# Patient Record
Sex: Female | Born: 1935 | Race: White | Hispanic: No | Marital: Married | Smoking: Never smoker
Health system: Southern US, Community
[De-identification: ages and names within clinical notes are randomized; demographics above are authoritative.]

## PROBLEM LIST (undated history)

## (undated) DIAGNOSIS — C801 Malignant (primary) neoplasm, unspecified: Secondary | ICD-10-CM

## (undated) DIAGNOSIS — J189 Pneumonia, unspecified organism: Secondary | ICD-10-CM

## (undated) DIAGNOSIS — I10 Essential (primary) hypertension: Secondary | ICD-10-CM

## (undated) HISTORY — DX: Essential (primary) hypertension: I10

## (undated) HISTORY — DX: Pneumonia, unspecified organism: J18.9

## (undated) HISTORY — DX: Malignant (primary) neoplasm, unspecified: C80.1

---

## 1975-01-09 HISTORY — PX: ABDOMINAL HYSTERECTOMY: SHX81

## 1976-01-09 HISTORY — PX: HAND SURGERY: SHX662

## 2008-07-30 ENCOUNTER — Encounter: Admission: RE | Admit: 2008-07-30 | Discharge: 2008-07-30 | Payer: Self-pay | Admitting: Geriatric Medicine

## 2008-08-11 ENCOUNTER — Encounter: Admission: RE | Admit: 2008-08-11 | Discharge: 2008-08-11 | Payer: Self-pay | Admitting: Geriatric Medicine

## 2008-12-24 ENCOUNTER — Ambulatory Visit (HOSPITAL_COMMUNITY): Admission: RE | Admit: 2008-12-24 | Discharge: 2008-12-24 | Payer: Self-pay | Admitting: Gastroenterology

## 2009-01-08 HISTORY — PX: OTHER SURGICAL HISTORY: SHX169

## 2009-02-09 ENCOUNTER — Encounter: Admission: RE | Admit: 2009-02-09 | Discharge: 2009-02-09 | Payer: Self-pay | Admitting: Geriatric Medicine

## 2009-08-01 ENCOUNTER — Encounter: Admission: RE | Admit: 2009-08-01 | Discharge: 2009-08-01 | Payer: Self-pay | Admitting: Geriatric Medicine

## 2009-09-19 ENCOUNTER — Encounter: Admission: RE | Admit: 2009-09-19 | Discharge: 2009-09-19 | Payer: Self-pay | Admitting: Surgery

## 2009-09-19 ENCOUNTER — Ambulatory Visit (HOSPITAL_COMMUNITY): Admission: RE | Admit: 2009-09-19 | Discharge: 2009-09-19 | Payer: Self-pay | Admitting: Surgery

## 2009-09-21 ENCOUNTER — Ambulatory Visit: Payer: Self-pay | Admitting: Oncology

## 2009-09-28 LAB — CANCER ANTIGEN 27.29: CA 27.29: 21 U/mL (ref 0–39)

## 2010-03-23 LAB — COMPREHENSIVE METABOLIC PANEL
AST: 25 U/L (ref 0–37)
Alkaline Phosphatase: 63 U/L (ref 39–117)
BUN: 14 mg/dL (ref 6–23)
Calcium: 9.2 mg/dL (ref 8.4–10.5)
Creatinine, Ser: 0.76 mg/dL (ref 0.4–1.2)
Potassium: 5.2 mEq/L — ABNORMAL HIGH (ref 3.5–5.1)
Sodium: 140 mEq/L (ref 135–145)

## 2010-03-23 LAB — DIFFERENTIAL
Basophils Relative: 0 % (ref 0–1)
Lymphocytes Relative: 32 % (ref 12–46)
Monocytes Relative: 13 % — ABNORMAL HIGH (ref 3–12)
Neutro Abs: 3 10*3/uL (ref 1.7–7.7)

## 2010-03-23 LAB — CBC
HCT: 41.7 % (ref 36.0–46.0)
MCHC: 33.1 g/dL (ref 30.0–36.0)
Platelets: 294 10*3/uL (ref 150–400)
RDW: 12.9 % (ref 11.5–15.5)

## 2010-03-23 LAB — SURGICAL PCR SCREEN: MRSA, PCR: NEGATIVE

## 2010-05-01 ENCOUNTER — Other Ambulatory Visit: Payer: Self-pay | Admitting: Oncology

## 2010-05-01 ENCOUNTER — Encounter (HOSPITAL_BASED_OUTPATIENT_CLINIC_OR_DEPARTMENT_OTHER): Payer: Medicare PPO | Admitting: Oncology

## 2010-05-01 DIAGNOSIS — C50919 Malignant neoplasm of unspecified site of unspecified female breast: Secondary | ICD-10-CM

## 2010-05-01 DIAGNOSIS — I10 Essential (primary) hypertension: Secondary | ICD-10-CM

## 2010-05-01 DIAGNOSIS — M1990 Unspecified osteoarthritis, unspecified site: Secondary | ICD-10-CM

## 2010-05-01 DIAGNOSIS — C50519 Malignant neoplasm of lower-outer quadrant of unspecified female breast: Secondary | ICD-10-CM

## 2010-05-01 DIAGNOSIS — Z9889 Other specified postprocedural states: Secondary | ICD-10-CM

## 2010-05-01 LAB — CBC WITH DIFFERENTIAL/PLATELET
BASO%: 0.5 % (ref 0.0–2.0)
HCT: 39.5 % (ref 34.8–46.6)
LYMPH%: 30.7 % (ref 14.0–49.7)
MCHC: 33.8 g/dL (ref 31.5–36.0)
MCV: 101 fL (ref 79.5–101.0)
NEUT#: 3.9 10*3/uL (ref 1.5–6.5)
NEUT%: 60.5 % (ref 38.4–76.8)
RBC: 3.91 10*6/uL (ref 3.70–5.45)
WBC: 6.4 10*3/uL (ref 3.9–10.3)
lymph#: 2 10*3/uL (ref 0.9–3.3)

## 2010-05-09 ENCOUNTER — Encounter (INDEPENDENT_AMBULATORY_CARE_PROVIDER_SITE_OTHER): Payer: Self-pay | Admitting: Surgery

## 2010-08-03 ENCOUNTER — Ambulatory Visit
Admission: RE | Admit: 2010-08-03 | Discharge: 2010-08-03 | Disposition: A | Discharge status: Home / Self Care | Payer: Medicare PPO | Source: Ambulatory Visit | Attending: Oncology | Admitting: Oncology

## 2010-08-03 DIAGNOSIS — Z9889 Other specified postprocedural states: Secondary | ICD-10-CM

## 2010-10-16 ENCOUNTER — Other Ambulatory Visit: Payer: Self-pay | Admitting: Oncology

## 2010-10-16 ENCOUNTER — Encounter (HOSPITAL_BASED_OUTPATIENT_CLINIC_OR_DEPARTMENT_OTHER): Payer: Medicare PPO | Admitting: Oncology

## 2010-10-16 DIAGNOSIS — M1990 Unspecified osteoarthritis, unspecified site: Secondary | ICD-10-CM

## 2010-10-16 DIAGNOSIS — C50519 Malignant neoplasm of lower-outer quadrant of unspecified female breast: Secondary | ICD-10-CM

## 2010-10-16 DIAGNOSIS — I10 Essential (primary) hypertension: Secondary | ICD-10-CM

## 2010-10-16 DIAGNOSIS — Z170 Estrogen receptor positive status [ER+]: Secondary | ICD-10-CM

## 2010-10-16 DIAGNOSIS — C50919 Malignant neoplasm of unspecified site of unspecified female breast: Secondary | ICD-10-CM

## 2010-10-16 LAB — CBC WITH DIFFERENTIAL/PLATELET
BASO%: 0.4 % (ref 0.0–2.0)
Basophils Absolute: 0 10*3/uL (ref 0.0–0.1)
EOS%: 0.7 % (ref 0.0–7.0)
Eosinophils Absolute: 0.1 10*3/uL (ref 0.0–0.5)
HGB: 12.7 g/dL (ref 11.6–15.9)
LYMPH%: 25.3 % (ref 14.0–49.7)
MCH: 32.9 pg (ref 25.1–34.0)
MCHC: 33.4 g/dL (ref 31.5–36.0)
NEUT#: 5.3 10*3/uL (ref 1.5–6.5)
NEUT%: 63.3 % (ref 38.4–76.8)
WBC: 8.3 10*3/uL (ref 3.9–10.3)
lymph#: 2.1 10*3/uL (ref 0.9–3.3)

## 2010-12-11 ENCOUNTER — Telehealth: Payer: Self-pay | Admitting: *Deleted

## 2010-12-11 NOTE — Telephone Encounter (Signed)
left voice message to inform the patient of the new date and time on 10-2011

## 2011-05-08 ENCOUNTER — Encounter (INDEPENDENT_AMBULATORY_CARE_PROVIDER_SITE_OTHER): Payer: Self-pay | Admitting: Surgery

## 2011-05-08 ENCOUNTER — Ambulatory Visit (INDEPENDENT_AMBULATORY_CARE_PROVIDER_SITE_OTHER): Payer: Medicare PPO | Admitting: Surgery

## 2011-05-08 VITALS — BP 132/72 | HR 62 | Temp 98.4°F | Resp 16 | Ht 64.0 in | Wt 166.4 lb

## 2011-05-08 DIAGNOSIS — Z853 Personal history of malignant neoplasm of breast: Secondary | ICD-10-CM

## 2011-05-08 NOTE — Patient Instructions (Signed)
Follow up 1 year April 2014.

## 2011-05-08 NOTE — Progress Notes (Signed)
NAME: [name]       DOB: 1922-06-21           DATE: 05/08/2011       MRN: [id number]   [name] is a 78 y.o.Marland Kitchenfemale who presents for routine followup of her Right breast cancer stage1 diagnosed in 2011 and treated with breast conservation.. She has no problems or concerns on either side.  PFSH: She has had no significant changes since the last visit here.  ROS: There have been no significant changes since the last visit here  EXAM: General: The patient is alert, oriented, generally healty appearing, NAD. Mood and affect are normal.  Breasts:  normal .  No masses noted bilaterally.  Well healed surgical scar on right  Lymphatics: She has no axillary or supraclavicular adenopathy on either side.  Extremities: Full ROM of the surgical side with no lymphedema noted.  Data Reviewed:   Impression: Doing well, with no evidence of recurrent cancer or new cancer  Plan: Will continue to follow up on an annual basis here.

## 2011-06-11 ENCOUNTER — Other Ambulatory Visit: Payer: Self-pay | Admitting: Geriatric Medicine

## 2011-06-11 DIAGNOSIS — R200 Anesthesia of skin: Secondary | ICD-10-CM

## 2011-06-12 ENCOUNTER — Ambulatory Visit
Admission: RE | Admit: 2011-06-12 | Discharge: 2011-06-12 | Disposition: A | Discharge status: Home / Self Care | Payer: Medicare PPO | Source: Ambulatory Visit | Attending: Geriatric Medicine | Admitting: Geriatric Medicine

## 2011-06-12 DIAGNOSIS — R200 Anesthesia of skin: Secondary | ICD-10-CM

## 2011-06-15 ENCOUNTER — Emergency Department (HOSPITAL_COMMUNITY): Payer: Medicare PPO

## 2011-06-15 ENCOUNTER — Emergency Department (HOSPITAL_COMMUNITY)
Admission: EM | Admit: 2011-06-15 | Discharge: 2011-06-15 | Disposition: A | Discharge status: Home / Self Care | Payer: Medicare PPO | Attending: Emergency Medicine | Admitting: Emergency Medicine

## 2011-06-15 ENCOUNTER — Encounter (HOSPITAL_COMMUNITY): Payer: Self-pay | Admitting: *Deleted

## 2011-06-15 DIAGNOSIS — R9431 Abnormal electrocardiogram [ECG] [EKG]: Secondary | ICD-10-CM | POA: Insufficient documentation

## 2011-06-15 DIAGNOSIS — G319 Degenerative disease of nervous system, unspecified: Secondary | ICD-10-CM | POA: Insufficient documentation

## 2011-06-15 DIAGNOSIS — R209 Unspecified disturbances of skin sensation: Secondary | ICD-10-CM | POA: Insufficient documentation

## 2011-06-15 DIAGNOSIS — I10 Essential (primary) hypertension: Secondary | ICD-10-CM | POA: Insufficient documentation

## 2011-06-15 DIAGNOSIS — M47812 Spondylosis without myelopathy or radiculopathy, cervical region: Secondary | ICD-10-CM | POA: Insufficient documentation

## 2011-06-15 DIAGNOSIS — R202 Paresthesia of skin: Secondary | ICD-10-CM

## 2011-06-15 DIAGNOSIS — Z853 Personal history of malignant neoplasm of breast: Secondary | ICD-10-CM | POA: Insufficient documentation

## 2011-06-15 LAB — PREGNANCY, URINE: Preg Test, Ur: NEGATIVE

## 2011-06-15 LAB — D-DIMER, QUANTITATIVE: D-Dimer, Quant: 0.41 ug/mL-FEU (ref 0.00–0.48)

## 2011-06-15 LAB — URINALYSIS, ROUTINE W REFLEX MICROSCOPIC
Nitrite: NEGATIVE
Protein, ur: NEGATIVE mg/dL
Specific Gravity, Urine: 1.003 — ABNORMAL LOW (ref 1.005–1.030)
Urobilinogen, UA: 0.2 mg/dL (ref 0.0–1.0)

## 2011-06-15 LAB — COMPREHENSIVE METABOLIC PANEL
BUN: 18 mg/dL (ref 6–23)
CO2: 23 mEq/L (ref 19–32)
Calcium: 9.3 mg/dL (ref 8.4–10.5)
Chloride: 103 mEq/L (ref 96–112)
Creatinine, Ser: 0.95 mg/dL (ref 0.50–1.10)
GFR calc Af Amer: 61 mL/min — ABNORMAL LOW (ref >90)
GFR calc non Af Amer: 52 mL/min — ABNORMAL LOW (ref >90)
Glucose, Bld: 105 mg/dL — ABNORMAL HIGH (ref 70–99)
Total Bilirubin: 0.3 mg/dL (ref 0.3–1.2)

## 2011-06-15 LAB — POCT I-STAT TROPONIN I: Troponin i, poc: 0 ng/mL (ref 0.00–0.08)

## 2011-06-15 LAB — CBC
HCT: 41.5 % (ref 36.0–46.0)
Hemoglobin: 13.9 g/dL (ref 12.0–15.0)
MCHC: 33.5 g/dL (ref 30.0–36.0)
MCV: 99.3 fL (ref 78.0–100.0)
RDW: 14.1 % (ref 11.5–15.5)
WBC: 7 10*3/uL (ref 4.0–10.5)

## 2011-06-15 LAB — DIFFERENTIAL
Basophils Absolute: 0 10*3/uL (ref 0.0–0.1)
Eosinophils Relative: 1 % (ref 0–5)
Lymphocytes Relative: 33 % (ref 12–46)
Monocytes Absolute: 0.7 10*3/uL (ref 0.1–1.0)
Monocytes Relative: 10 % (ref 3–12)
Neutro Abs: 3.9 10*3/uL (ref 1.7–7.7)

## 2011-06-15 LAB — URINE MICROSCOPIC-ADD ON

## 2011-06-15 LAB — APTT: aPTT: 28 seconds (ref 24–37)

## 2011-06-15 NOTE — ED Notes (Signed)
PT reported feeling tingling in both arms since SAt. Pt was seen at the wellness center at [facility] living center. Pt had CP on Sat. But did not come to Hospital. Today Pt denies CP.

## 2011-06-15 NOTE — Discharge Instructions (Signed)
[name], you had physical exam, laboratory tests, an MRI of the brain and cervical spine to check on you for tingling feelings in the hands and feet.  Fortunately, your tests and MRI's were normal.  The feelings that you have are called paresthesias, and are not a stroke.  It is safe to return home and to continue to take your regular medicines.

## 2011-06-15 NOTE — ED Notes (Signed)
Discharged with instructions via teach back. WC to car pt alertx3 respirations easy non labored.

## 2011-06-15 NOTE — ED Provider Notes (Signed)
History     CSN: [id number]  Arrival date & time 06/15/11  53   First MD Initiated Contact with Patient 06/15/11 1544      No chief complaint on file.   (Consider location/radiation/quality/duration/timing/severity/associated sxs/prior treatment) HPI Comments: Pt is an 78 year old woman living at the [facility].  She had had an episode of numbness and tingling in her neck, both hands, and right foot. She was observed in the infirmary at the [facility] and had resolution of her symptoms.  An MRI of the brain was negative on Tuesday, 06/12/2011.  Now she has had recurrence of the tingling in the hands and fingers and a bandlike feeling in the right ankle.  There is no difficulty with speech.  She has no difficulty walking  With her recurrence of symptoms she was sent to [facility] ED for further evaluaton.  I called Dr. Felipa Eth, her internist, who said that her symptoms had entirely resolved when she was released on Wednesday, 2 days ago.   Patient is a 78 y.o. female presenting with neurologic complaint.  Neurologic Problem The primary symptoms include paresthesias. Primary symptoms do not include fever. The symptoms began 3 to 5 days ago. Episode duration: Had last Saturday, 5 days ago, symptoms resolved, and have recurred today. The symptoms are unchanged. The neurological symptoms are focal. Context: No precipitating event.  The paresthesias are described as tingling. Affected locations include the: right hand, left hand and right distal leg.  Medical issues also include hypertension. Workup history includes MRI.    Past Medical History  Diagnosis Date  . Pneumonia   . Cancer     breast - right    Past Surgical History  Procedure Date  . Abdominal hysterectomy 1977  . Hand surgery 1978  . Toe nail removal 2011    Family History  Problem Relation Age of Onset  . Stroke Mother   . Cancer Father     History  Substance Use Topics  . Smoking status: Never Smoker   .  Smokeless tobacco: Never Used  . Alcohol Use: Yes     1 GLASS A MONTH    OB History    Grav Para Term Preterm Abortions TAB SAB Ect Mult Living                  Review of Systems  Constitutional: Negative.  Negative for fever and chills.  HENT: Negative.   Eyes: Negative.   Respiratory: Negative.   Cardiovascular: Negative.   Gastrointestinal: Negative.   Genitourinary: Negative.   Musculoskeletal: Negative.   Neurological: Positive for paresthesias. Numbness: Numbness and tingling in the hands, and the right ankle.  Psychiatric/Behavioral: Negative.     Allergies  Review of patient's allergies indicates no known allergies.  Home Medications   Current Outpatient Rx  Name Route Sig Dispense Refill  . ASPIRIN 81 MG PO TABS Oral Take 81 mg by mouth daily.      Marland Kitchen CRANBERRY PO Oral Take 1 capsule by mouth daily.     . FUROSEMIDE 40 MG PO TABS Oral Take 40 mg by mouth Daily.     Marland Kitchen GLUCOSAMINE SULFATE 1000 MG PO CAPS Oral Take 1 capsule by mouth daily.     Marland Kitchen NAPHAZOLINE HCL 0.012 % OP SOLN Both Eyes Place 1 drop into both eyes daily as needed. For dry eyes      BP 128/49  Pulse 85  Temp(Src) 97.9 F (36.6 C) (Oral)  Resp 18  SpO2 95%  Physical Exam  Nursing note and vitals reviewed. Constitutional: She is oriented to person, place, and time. She appears well-developed and well-nourished. No distress.  HENT:  Head: Normocephalic and atraumatic.  Right Ear: External ear normal.  Left Ear: External ear normal.  Mouth/Throat: Oropharynx is clear and moist.  Eyes: Conjunctivae and EOM are normal. Pupils are equal, round, and reactive to light. No scleral icterus.  Neck: Normal range of motion. Neck supple.       No carotid bruits.  Cardiovascular: Normal rate, regular rhythm and normal heart sounds.   Pulmonary/Chest: Effort normal and breath sounds normal.  Abdominal: Soft. Bowel sounds are normal.  Musculoskeletal: Normal range of motion. She exhibits no edema and no  tenderness.  Neurological: She is alert and oriented to person, place, and time.       No sensory or motor deficit. She has a qualitative tingly feeling in her right hand, less so in the left hand, and a circumferential feeling in the right ankle.  Skin: Skin is warm and dry.  Psychiatric: She has a normal mood and affect. Her behavior is normal.    ED Course  Procedures (including critical care time)  3:44 PMMr Brain Wo Contrast  06/12/2011  *RADIOLOGY REPORT*  Clinical Data: Right arm and bilateral foot numbness.  Question stroke?  MRI HEAD WITHOUT CONTRAST  Technique:  Multiplanar, multiecho pulse sequences of the brain and surrounding structures were obtained according to standard protocol without intravenous contrast.  Comparison: None.  Findings: No acute infarct.  No intracranial hemorrhage.  Mild global atrophy without hydrocephalus.  Moderate small vessel disease type changes.  Major intracranial vascular structures are patent with small right vertebral artery.  Partially empty sella incidentally noted.  IMPRESSION: No acute infarct.  Please see above.  Original Report Authenticated By: Doug Sou, M.D.   3:45 PM  Date: 06/15/2011  Rate: 95  Rhythm: normal sinus rhythm and premature ventricular contractions (PVC)--ventricular bigeminy  QRS Axis: normal  Intervals: normal PQRS:  Biatrial abnormalities  ST/T Wave abnormalities: normal and nonspecific T wave changes  Conduction Disutrbances:none  Narrative Interpretation: Abnormal EKG  Old EKG Reviewed: none available  6:41 PM Results for orders placed during the hospital encounter of 06/15/11  CBC      Component Value Range   WBC 7.0  4.0 - 10.5 (K/uL)   RBC 4.18  3.87 - 5.11 (MIL/uL)   Hemoglobin 13.9  12.0 - 15.0 (g/dL)   HCT 41.5  36.0 - 46.0 (%)   MCV 99.3  78.0 - 100.0 (fL)   MCH 33.3  26.0 - 34.0 (pg)   MCHC 33.5  30.0 - 36.0 (g/dL)   RDW 14.1  11.5 - 15.5 (%)   Platelets 261  150 - 400 (K/uL)  DIFFERENTIAL       Component Value Range   Neutrophils Relative 56  43 - 77 (%)   Neutro Abs 3.9  1.7 - 7.7 (K/uL)   Lymphocytes Relative 33  12 - 46 (%)   Lymphs Abs 2.3  0.7 - 4.0 (K/uL)   Monocytes Relative 10  3 - 12 (%)   Monocytes Absolute 0.7  0.1 - 1.0 (K/uL)   Eosinophils Relative 1  0 - 5 (%)   Eosinophils Absolute 0.0  0.0 - 0.7 (K/uL)   Basophils Relative 0  0 - 1 (%)   Basophils Absolute 0.0  0.0 - 0.1 (K/uL)  COMPREHENSIVE METABOLIC PANEL      Component Value Range  Sodium 140  135 - 145 (mEq/L)   Potassium 4.2  3.5 - 5.1 (mEq/L)   Chloride 103  96 - 112 (mEq/L)   CO2 23  19 - 32 (mEq/L)   Glucose, Bld 105 (*) 70 - 99 (mg/dL)   BUN 18  6 - 23 (mg/dL)   Creatinine, Ser 0.95  0.50 - 1.10 (mg/dL)   Calcium 9.3  8.4 - 10.5 (mg/dL)   Total Protein 7.0  6.0 - 8.3 (g/dL)   Albumin 3.8  3.5 - 5.2 (g/dL)   AST 18  0 - 37 (U/L)   ALT 14  0 - 35 (U/L)   Alkaline Phosphatase 69  39 - 117 (U/L)   Total Bilirubin 0.3  0.3 - 1.2 (mg/dL)   GFR calc non Af Amer 52 (*) >90 (mL/min)   GFR calc Af Amer 61 (*) >90 (mL/min)  URINALYSIS, ROUTINE W REFLEX MICROSCOPIC      Component Value Range   Color, Urine YELLOW  YELLOW    APPearance HAZY (*) CLEAR    Specific Gravity, Urine 1.003 (*) 1.005 - 1.030    pH 7.5  5.0 - 8.0    Glucose, UA NEGATIVE  NEGATIVE (mg/dL)   Hgb urine dipstick TRACE (*) NEGATIVE    Bilirubin Urine NEGATIVE  NEGATIVE    Ketones, ur NEGATIVE  NEGATIVE (mg/dL)   Protein, ur NEGATIVE  NEGATIVE (mg/dL)   Urobilinogen, UA 0.2  0.0 - 1.0 (mg/dL)   Nitrite NEGATIVE  NEGATIVE    Leukocytes, UA MODERATE (*) NEGATIVE   PREGNANCY, URINE      Component Value Range   Preg Test, Ur NEGATIVE  NEGATIVE   D-DIMER, QUANTITATIVE      Component Value Range   D-Dimer, Quant 0.41  0.00 - 0.48 (ug/mL-FEU)  PROTIME-INR      Component Value Range   Prothrombin Time 15.2  11.6 - 15.2 (seconds)   INR 1.18  0.00 - 1.49   APTT      Component Value Range   aPTT 28  24 - 37 (seconds)  URINE  MICROSCOPIC-ADD ON      Component Value Range   Squamous Epithelial / LPF RARE  RARE    WBC, UA 11-20  <3 (WBC/hpf)   RBC / HPF 0-2  <3 (RBC/hpf)   Bacteria, UA FEW (*) RARE   POCT I-STAT TROPONIN I      Component Value Range   Troponin i, poc 0.00  0.00 - 0.08 (ng/mL)   Comment 3            Mr Brain Wo Contrast  06/12/2011  *RADIOLOGY REPORT*  Clinical Data: Right arm and bilateral foot numbness.  Question stroke?  MRI HEAD WITHOUT CONTRAST  Technique:  Multiplanar, multiecho pulse sequences of the brain and surrounding structures were obtained according to standard protocol without intravenous contrast.  Comparison: None.  Findings: No acute infarct.  No intracranial hemorrhage.  Mild global atrophy without hydrocephalus.  Moderate small vessel disease type changes.  Major intracranial vascular structures are patent with small right vertebral artery.  Partially empty sella incidentally noted.  IMPRESSION: No acute infarct.  Please see above.  Original Report Authenticated By: Doug Sou, M.D.    Lab workup negative so far.  Waiting for results of today's MRIs.  7:49 PM MRI of head and C-spine negative.  Results reported to pt.  I advised her that the feelings that she had were called paresthesias, and were not a stroke.  It is  safe to return to her facilty and to continue her regular medicines.   1. Paresthesia        Mylinda Latina III, MD 06/15/11 [phone]

## 2011-06-15 NOTE — ED Notes (Signed)
Phlebotomy at bedside.

## 2011-06-22 ENCOUNTER — Other Ambulatory Visit: Payer: Self-pay | Admitting: Geriatric Medicine

## 2011-06-22 DIAGNOSIS — Z853 Personal history of malignant neoplasm of breast: Secondary | ICD-10-CM

## 2011-06-22 DIAGNOSIS — Z9889 Other specified postprocedural states: Secondary | ICD-10-CM

## 2011-08-08 ENCOUNTER — Ambulatory Visit
Admission: RE | Admit: 2011-08-08 | Discharge: 2011-08-08 | Disposition: A | Discharge status: Home / Self Care | Payer: Medicare PPO | Source: Ambulatory Visit | Attending: Geriatric Medicine | Admitting: Geriatric Medicine

## 2011-08-08 DIAGNOSIS — Z9889 Other specified postprocedural states: Secondary | ICD-10-CM

## 2011-08-08 DIAGNOSIS — Z853 Personal history of malignant neoplasm of breast: Secondary | ICD-10-CM

## 2011-10-16 ENCOUNTER — Other Ambulatory Visit: Payer: Medicare PPO | Admitting: Lab

## 2011-10-16 ENCOUNTER — Ambulatory Visit: Payer: Medicare PPO | Admitting: Oncology

## 2011-11-27 ENCOUNTER — Other Ambulatory Visit: Payer: Self-pay | Admitting: *Deleted

## 2011-11-27 DIAGNOSIS — Z853 Personal history of malignant neoplasm of breast: Secondary | ICD-10-CM | POA: Insufficient documentation

## 2011-11-28 ENCOUNTER — Telehealth: Payer: Self-pay | Admitting: Oncology

## 2011-11-28 ENCOUNTER — Ambulatory Visit (HOSPITAL_BASED_OUTPATIENT_CLINIC_OR_DEPARTMENT_OTHER): Payer: Medicare PPO | Admitting: Oncology

## 2011-11-28 ENCOUNTER — Other Ambulatory Visit (HOSPITAL_BASED_OUTPATIENT_CLINIC_OR_DEPARTMENT_OTHER): Payer: Medicare PPO | Admitting: Lab

## 2011-11-28 VITALS — BP 175/85 | HR 84 | Temp 97.6°F | Resp 18 | Ht 64.0 in | Wt 159.4 lb

## 2011-11-28 DIAGNOSIS — Z170 Estrogen receptor positive status [ER+]: Secondary | ICD-10-CM

## 2011-11-28 DIAGNOSIS — Z853 Personal history of malignant neoplasm of breast: Secondary | ICD-10-CM

## 2011-11-28 DIAGNOSIS — C50519 Malignant neoplasm of lower-outer quadrant of unspecified female breast: Secondary | ICD-10-CM

## 2011-11-28 DIAGNOSIS — C50919 Malignant neoplasm of unspecified site of unspecified female breast: Secondary | ICD-10-CM

## 2011-11-28 LAB — CBC WITH DIFFERENTIAL/PLATELET
BASO%: 0.4 % (ref 0.0–2.0)
LYMPH%: 29.1 % (ref 14.0–49.7)
MCHC: 33.5 g/dL (ref 31.5–36.0)
MONO#: 0.7 10*3/uL (ref 0.1–0.9)
MONO%: 10.1 % (ref 0.0–14.0)
NEUT#: 4.3 10*3/uL (ref 1.5–6.5)
Platelets: 266 10*3/uL (ref 145–400)
RBC: 4.19 10*6/uL (ref 3.70–5.45)
RDW: 13.7 % (ref 11.2–14.5)
WBC: 7.2 10*3/uL (ref 3.9–10.3)

## 2011-11-28 LAB — COMPREHENSIVE METABOLIC PANEL (CC13)
ALT: 16 U/L (ref 0–55)
Albumin: 3.8 g/dL (ref 3.5–5.0)
Alkaline Phosphatase: 68 U/L (ref 40–150)
CO2: 22 mEq/L (ref 22–29)
Potassium: 5 mEq/L (ref 3.5–5.1)
Sodium: 139 mEq/L (ref 136–145)
Total Bilirubin: 0.5 mg/dL (ref 0.20–1.20)
Total Protein: 7 g/dL (ref 6.4–8.3)

## 2011-11-28 NOTE — Progress Notes (Signed)
ID: [name]   DOB: 12/22/1922  MR#: [id number]  [id number]  PCP: Mathews Argyle, MD GYN:  SU: Thomas Cornett OTHER MD: Carmon Sails   HISTORY OF PRESENT ILLNESS: The patient had screening mammography July of 2010, showing a possible mass in the right breast.  She was brought back for additional views August of 2010 and there was persistence of a 5 mm low-density nodule with no associated malignant type calcifications.  This was not palpable and not apparent by sonogram.    Accordingly, the patient was brought back for six-month follow-up in February 2011.  There was no change in the 5 mm nodule over the central right breast, and the rest of the exam is unchanged.  An additional six-month follow-up was recommended, and this was performed August 01, 2009.  This time the breast tissue is described as almost entirely fatty.  There was a small spiculated nodule in the right lower outer quadrant, which again was not palpable by physical examination.  Ultrasound did show an ill-defined hypoechoic nodule measuring approximately 5 mm, with no evidence of axillary involvement by ultrasonography.  This was felt to be suspicious enough to warrant biopsy, and accordingly, Dr. Sadie Haber proceeded to biopsy of the mass on the same day with the pathology ([id number]) showing invasive breast cancer apparently low-grade, estrogen 100% and progesterone 77% positive, with a low proliferation marker at 15% and no evidence of HER2 amplification.   With this information, the patient was referred to Dr. Brantley Stage, and after appropriate discussion, the patient underwent needle localization lumpectomy September 19, 2009, with the final pathology [id number]) showing invasive lobular carcinoma measuring a maximum of 0.48 cm.  The margins were negative.  The closest being nearly 1 cm.  There was no evidence of lymphovascular invasion.  The tumor was described as grade 1, and no axillary lymph node was sampled.  Her subsequent history is as detailed below.  INTERVAL HISTORY: The patient returns today for routine followup of her breast cancer. She is "keeping busy", exercising for mornings a week and 4 afternoons a week at [facility] where she [facility].   REVIEW OF SYSTEMS: She is having knee problems and is considering knee surgery next year. Otherwise she has not noted any change in either breast, and has no symptoms suggestive of disease recurrence or progression. A detailed review of systems today was noncontributory.  PAST MEDICAL HISTORY: Past Medical History  Diagnosis Date  . Pneumonia   . Cancer     breast - right  The past medical history is significant for hypertension, osteoarthritis, history of ingrown eyelashes, history of seasonal allergies, history of moderate bladder incontinence with occasional UTIs.    PAST SURGICAL HISTORY: Past Surgical History  Procedure Date  . Abdominal hysterectomy 1977  . Hand surgery 1978  . Toe nail removal 2011  History of hammertoe surgery x two, status post TAH-BSO in 1974, history of blepharoplasty, history of appendectomy, history of tonsillectomy and adenoidectomy.     FAMILY HISTORY Family History  Problem Relation Age of Onset  . Stroke Mother   . Cancer Father   The patient's father died with prostate cancer at the age of 23.  The patient's mother died at the age of 31 following a stroke.  The patient had one sister.  The only breast cancer in the family was diagnosed in one of the patient's mother's three sisters in that person's mid-60s.    GYNECOLOGIC HISTORY: She is GX P2.  First pregnancy to term age  42. She took hormones less than a year after her hysterectomy in 1974.    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  Her husband of soon to be 23 years, Barnabas Lister, used to work for [employer] as a Quarry manager.  They have a side that died within the last year from a staph infection.  A second son, 70 years old lives in [place], and works for  the state.  The patient has six grandchildren, seven great-grandchildren and one great great-grandchild.  She is an Engineer, maintenance (IT).     ADVANCED DIRECTIVES:  HEALTH MAINTENANCE: History  Substance Use Topics  . Smoking status: Never Smoker   . Smokeless tobacco: Never Used  . Alcohol Use: Yes     Comment: 1 GLASS A MONTH     Colonoscopy:  PAP:  Bone density:  Lipid panel:  Allergies  Allergen Reactions  . Macrobid (Nitrofurantoin Macrocrystal)     Caused shortness of breath.    Current Outpatient Prescriptions  Medication Sig Dispense Refill  . aspirin 81 MG tablet Take 81 mg by mouth daily.        Marland Kitchen CRANBERRY PO Take 1 capsule by mouth daily.       . furosemide (LASIX) 40 MG tablet Take 40 mg by mouth Daily.       . Glucosamine Sulfate 1000 MG CAPS Take 1 capsule by mouth daily.       . meloxicam (MOBIC) 7.5 MG tablet Take 7.5 mg by mouth daily.      . naphazoline (CLEAR EYES) 0.012 % ophthalmic solution Place 1 drop into both eyes daily as needed. For dry eyes        OBJECTIVE: Elderly white woman in no acute distress Filed Vitals:   11/28/11 1100  BP: 175/85  Pulse: 84  Temp: 97.6 F (36.4 C)  Resp: 18     Body mass index is 27.36 kg/(m^2).    ECOG FS: 1  Sclerae unicteric Oropharynx clear No cervical or supraclavicular adenopathy Lungs no rales or rhonchi Heart regular rate and rhythm Abd benign MSK no focal spinal tenderness, no peripheral edema Neuro: nonfocal Breasts: The right breast is status post lumpectomy; there is no evidence of local recurrence; the right axilla is benign. The left breast is unremarkable.   LAB RESULTS: Lab Results  Component Value Date   WBC 7.2 11/28/2011   NEUTROABS 4.3 11/28/2011   HGB 14.1 11/28/2011   HCT 42.1 11/28/2011   MCV 100.5 11/28/2011   PLT 266 11/28/2011      Chemistry      Component Value Date/Time   NA 140 06/15/2011 1734   K 4.2 06/15/2011 1734   CL 103 06/15/2011 1734   CO2 23 06/15/2011 1734   BUN 18  06/15/2011 1734   CREATININE 0.95 06/15/2011 1734      Component Value Date/Time   CALCIUM 9.3 06/15/2011 1734   ALKPHOS 69 06/15/2011 1734   AST 18 06/15/2011 1734   ALT 14 06/15/2011 1734   BILITOT 0.3 06/15/2011 1734       Lab Results  Component Value Date   LABCA2 21 09/28/2009    No components found with this basename: [id number]    No results found for this basename: INR:1;PROTIME:1 in the last 168 hours  Urinalysis    Component Value Date/Time   COLORURINE YELLOW 06/15/2011 1628   APPEARANCEUR HAZY* 06/15/2011 1628   LABSPEC 1.003* 06/15/2011 1628   PHURINE 7.5 06/15/2011 1628   GLUCOSEU NEGATIVE 06/15/2011 1628   HGBUR  TRACE* 06/15/2011 1628   BILIRUBINUR NEGATIVE 06/15/2011 1628   KETONESUR NEGATIVE 06/15/2011 1628   PROTEINUR NEGATIVE 06/15/2011 1628   UROBILINOGEN 0.2 06/15/2011 1628   NITRITE NEGATIVE 06/15/2011 1628   LEUKOCYTESUR MODERATE* 06/15/2011 1628    STUDIES: No results found. Mammography July 2013 was benign  ASSESSMENT: 78 y.o.  woman status post right lumpectomy in September 2011 for a 5 mm invasive lobular breast cancer, which was strongly estrogen and progesterone receptor-positive, grade 1, HER-2 negative.  Margins were clear and there was no evidence of lymphovascular invasion.  She is being followed with observation alone.   PLAN: [name] is doing just fine from a breast cancer point of view and she has an excellent overall prognosis. I would not be uncomfortable releasing her to Dr. Carlyle Lipa care, but what we decided to do today is that she will see [place] one more time, August of next year, after her next mammogram. Likely that will be her last visit here assuming all continues well. She knows to call for any problems that may develop before that.   [name] C    11/28/2011

## 2011-11-28 NOTE — Telephone Encounter (Signed)
gve the pt her aug 2014 appt calendar

## 2012-06-30 ENCOUNTER — Other Ambulatory Visit: Payer: Self-pay | Admitting: Geriatric Medicine

## 2012-06-30 ENCOUNTER — Other Ambulatory Visit (INDEPENDENT_AMBULATORY_CARE_PROVIDER_SITE_OTHER): Payer: Self-pay | Admitting: Surgery

## 2012-06-30 DIAGNOSIS — Z853 Personal history of malignant neoplasm of breast: Secondary | ICD-10-CM

## 2012-08-08 ENCOUNTER — Ambulatory Visit
Admission: RE | Admit: 2012-08-08 | Discharge: 2012-08-08 | Disposition: A | Discharge status: Home / Self Care | Payer: Medicare PPO | Source: Ambulatory Visit | Attending: Geriatric Medicine | Admitting: Geriatric Medicine

## 2012-08-08 DIAGNOSIS — Z853 Personal history of malignant neoplasm of breast: Secondary | ICD-10-CM

## 2012-08-29 ENCOUNTER — Other Ambulatory Visit: Payer: Self-pay | Admitting: Physician Assistant

## 2012-08-29 DIAGNOSIS — Z853 Personal history of malignant neoplasm of breast: Secondary | ICD-10-CM

## 2012-09-01 ENCOUNTER — Other Ambulatory Visit (HOSPITAL_BASED_OUTPATIENT_CLINIC_OR_DEPARTMENT_OTHER): Payer: Medicare PPO

## 2012-09-01 ENCOUNTER — Encounter: Payer: Self-pay | Admitting: Physician Assistant

## 2012-09-01 ENCOUNTER — Ambulatory Visit (HOSPITAL_BASED_OUTPATIENT_CLINIC_OR_DEPARTMENT_OTHER): Payer: Medicare PPO | Admitting: Physician Assistant

## 2012-09-01 VITALS — BP 149/73 | HR 87 | Temp 98.7°F | Resp 20 | Ht 64.0 in | Wt 152.5 lb

## 2012-09-01 DIAGNOSIS — Z853 Personal history of malignant neoplasm of breast: Secondary | ICD-10-CM

## 2012-09-01 LAB — CBC WITH DIFFERENTIAL/PLATELET
BASO%: 0.4 % (ref 0.0–2.0)
EOS%: 0.9 % (ref 0.0–7.0)
LYMPH%: 29.3 % (ref 14.0–49.7)
MCHC: 33.7 g/dL (ref 31.5–36.0)
MCV: 99.3 fL (ref 79.5–101.0)
MONO%: 7.5 % (ref 0.0–14.0)
Platelets: 288 10*3/uL (ref 145–400)
RBC: 4.07 10*6/uL (ref 3.70–5.45)
nRBC: 0 % (ref 0–0)

## 2012-09-01 LAB — COMPREHENSIVE METABOLIC PANEL (CC13)
ALT: 16 U/L (ref 0–55)
AST: 20 U/L (ref 5–34)
Alkaline Phosphatase: 67 U/L (ref 40–150)
CO2: 24 mEq/L (ref 22–29)
Creatinine: 1 mg/dL (ref 0.6–1.1)
Total Bilirubin: 0.48 mg/dL (ref 0.20–1.20)

## 2012-09-01 NOTE — Progress Notes (Signed)
ID: [name]   DOB: [birth year]  MR#: [id number]  [id number]  PCP: Mathews Argyle, MD GYN:  SU: Thomas Cornett OTHER MD: Carmon Sails   HISTORY OF PRESENT ILLNESS: The patient had screening mammography July of 2010, showing a possible mass in the right breast.  She was brought back for additional views August of 2010 and there was persistence of a 5 mm low-density nodule with no associated malignant type calcifications.  This was not palpable and not apparent by sonogram.    Accordingly, the patient was brought back for six-month follow-up in February 2011.  There was no change in the 5 mm nodule over the central right breast, and the rest of the exam is unchanged.  An additional six-month follow-up was recommended, and this was performed August 01, 2009.  This time the breast tissue is described as almost entirely fatty.  There was a small spiculated nodule in the right lower outer quadrant, which again was not palpable by physical examination.  Ultrasound did show an ill-defined hypoechoic nodule measuring approximately 5 mm, with no evidence of axillary involvement by ultrasonography.  This was felt to be suspicious enough to warrant biopsy, and accordingly, Dr. Sadie Haber proceeded to biopsy of the mass on the same day with the pathology ([id number]) showing invasive breast cancer apparently low-grade, estrogen 100% and progesterone 77% positive, with a low proliferation marker at 15% and no evidence of HER2 amplification.   With this information, the patient was referred to Dr. Brantley Stage, and after appropriate discussion, the patient underwent needle localization lumpectomy September 19, 2009, with the final pathology [id number]) showing invasive lobular carcinoma measuring a maximum of 0.48 cm.  The margins were negative.  The closest being nearly 1 cm.  There was no evidence of lymphovascular invasion.  The tumor was described as grade 1, and no axillary lymph node was sampled.  Her subsequent history is as detailed below.  INTERVAL HISTORY: The patient returns today for routine followup of her right breast cancer. Interval history is unremarkable, and Kynlea is feeling well. She continues to exercise at least 5-6 days per week. In fact her only complaint today is frequent urination for which she scheduled to see Dr.  McDiarmid later today.   REVIEW OF SYSTEMS: Kaityln has had no recent illnesses and denies fevers or chills. She's had no skin changes, abnormal bruising, or abnormal bleeding. Her energy level is good as is her appetite. She denies any problems with nausea or change in bowel habits. She denies any cough, shortness of breath, orthopnea, or chest pain. She's had no peripheral swelling, and denies any unusual myalgias, arthralgias, or bony pain. She's had no abnormal headaches or dizziness.  A detailed review of systems is otherwise stable and noncontributory.   PAST MEDICAL HISTORY: Past Medical History  Diagnosis Date  . Pneumonia   . Cancer     breast - right  The past medical history is significant for hypertension, osteoarthritis, history of ingrown eyelashes, history of seasonal allergies, history of moderate bladder incontinence with occasional UTIs.    PAST SURGICAL HISTORY: Past Surgical History  Procedure Laterality Date  . Abdominal hysterectomy  1977  . Hand surgery  1978  . Toe nail removal  2011  History of hammertoe surgery x two, status post TAH-BSO in 1974, history of blepharoplasty, history of appendectomy, history of tonsillectomy and adenoidectomy.     FAMILY HISTORY Family History  Problem Relation Age of Onset  . Stroke Mother   . Cancer Father  The patient's father died with prostate cancer at the age of 73.  The patient's mother died at the age of 52 following a stroke.  The patient had one sister.  The only breast cancer in the family was diagnosed in one of the patient's mother's three sisters in that person's  mid-60s.    GYNECOLOGIC HISTORY: She is GX P2.  First pregnancy to term age 71. She took hormones less than a year after her hysterectomy in 1974.    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  Her husband of soon to be 31 years, Barnabas Lister, used to work for [employer] as a Quarry manager.  They have a son that died within the last year from a staph infection.  A second son, 64 years old lives in [place], and works for the state.  The patient has six grandchildren, seven great-grandchildren and one great great-grandchild.  She is an Engineer, maintenance (IT).     ADVANCED DIRECTIVES:  HEALTH MAINTENANCE: History  Substance Use Topics  . Smoking status: Never Smoker   . Smokeless tobacco: Never Used  . Alcohol Use: Yes     Comment: 1 GLASS A MONTH     Colonoscopy:  PAP:  Bone density:  Lipid panel:  Allergies  Allergen Reactions  . Macrobid [Nitrofurantoin Macrocrystal]     Caused shortness of breath.    Current Outpatient Prescriptions  Medication Sig Dispense Refill  . aspirin 81 MG tablet Take 81 mg by mouth daily.        Marland Kitchen CRANBERRY PO Take 1 capsule by mouth daily.       . furosemide (LASIX) 40 MG tablet Take 40 mg by mouth Daily.       . Glucosamine Sulfate 1000 MG CAPS Take 1 capsule by mouth daily.       . meloxicam (MOBIC) 7.5 MG tablet Take 7.5 mg by mouth daily.      . naphazoline (CLEAR EYES) 0.012 % ophthalmic solution Place 1 drop into both eyes daily as needed. For dry eyes       No current facility-administered medications for this visit.    OBJECTIVE: Elderly white woman in no acute distress Filed Vitals:   09/01/12 1106  BP: 149/73  Pulse: 87  Temp: 98.7 F (37.1 C)  Resp: 20     Body mass index is 26.16 kg/(m^2).    ECOG FS: 1 Filed Weights   09/01/12 1106  Weight: 152 lb 8 oz (69.174 kg)   Sclerae unicteric Oropharynx clear No cervical or supraclavicular adenopathy Lungs clear to auscultation; no wheezes; no rales or rhonchi Heart regular rate and  rhythm Abdomen soft, nontender palpation, positive bowel sounds MSK no focal spinal tenderness, no peripheral edema Neuro: nonfocal, well oriented with positive affect Breasts: The right breast is status post lumpectomy; there is no evidence of local recurrence. The left breast is unremarkable. Axillae are benign bilaterally, no palpable adenopathy   LAB RESULTS: Lab Results  Component Value Date   WBC 5.3 09/01/2012   NEUTROABS 3.3 09/01/2012   HGB 13.6 09/01/2012   HCT 40.4 09/01/2012   MCV 99.3 09/01/2012   PLT 288 09/01/2012      Chemistry      Component Value Date/Time   NA 140 09/01/2012 1008   NA 140 06/15/2011 1734   K 4.7 09/01/2012 1008   K 4.2 06/15/2011 1734   CL 108* 11/28/2011 1030   CL 103 06/15/2011 1734   CO2 24 09/01/2012 1008   CO2 23 06/15/2011 1734  BUN 21.0 09/01/2012 1008   BUN 18 06/15/2011 1734   CREATININE 1.0 09/01/2012 1008   CREATININE 0.95 06/15/2011 1734      Component Value Date/Time   CALCIUM 9.2 09/01/2012 1008   CALCIUM 9.3 06/15/2011 1734   ALKPHOS 67 09/01/2012 1008   ALKPHOS 69 06/15/2011 1734   AST 20 09/01/2012 1008   AST 18 06/15/2011 1734   ALT 16 09/01/2012 1008   ALT 14 06/15/2011 1734   BILITOT 0.48 09/01/2012 1008   BILITOT 0.3 06/15/2011 1734        STUDIES: Mm Digital Diagnostic Bilat  08/08/2012   *RADIOLOGY REPORT*  Clinical Data:  Lumpectomy on the right September 2011  DIGITAL DIAGNOSTIC BILATERAL MAMMOGRAM WITH CAD  Comparison: 08/08/2011, 08/03/2010, 08/01/2009  Findings:  ACR Breast Density Category b:  There are scattered areas of fibroglandular density.  Left breast is unchanged and negative.  On the right, there is mild postsurgical scarring centrally.  This is stable.  There are no suspicious findings.  Mammographic images were processed with CAD.  IMPRESSION: Stable benign postoperative appearance  BI-RADS CATEGORY 2:  Benign finding(s).  RECOMMENDATION: Diagnostic bilateral mammogram in 1 year.  I have discussed the findings and  recommendations with the patient. Results were also provided in writing at the conclusion of the visit.  If applicable, a reminder letter will be sent to the patient regarding her next appointment.   Original Report Authenticated By: Skipper Cliche, M.D.     ASSESSMENT: 79 y.o. [place] woman   (1)  status post right lumpectomy in September 2011 for a 5 mm invasive lobular breast cancer, which was strongly estrogen and progesterone receptor-positive, grade 1, HER-2 negative.  Margins were clear and there was no evidence of lymphovascular invasion.    (2)  She is being followed with observation alone.    PLAN: Upon review with Dr. Jana Hakim, we are ready to discharge Benjamine Mola from followup, and will refer her back to her primary care physician, Dr. Felipa Eth, for routine healthcare. Of course we did inform Keith that we will have her records for at least 10 years, and she certainly can call us with any problems or questions in the future. Otherwise, we will see her only on an as-needed basis.  Litha is very comfortable with this plan, and voices her understanding and agreement.   Abyan Cadman    09/01/2012

## 2013-04-23 ENCOUNTER — Ambulatory Visit (INDEPENDENT_AMBULATORY_CARE_PROVIDER_SITE_OTHER): Payer: Commercial Managed Care - HMO | Admitting: Podiatry

## 2013-04-23 ENCOUNTER — Encounter: Payer: Self-pay | Admitting: Podiatry

## 2013-04-23 ENCOUNTER — Ambulatory Visit (INDEPENDENT_AMBULATORY_CARE_PROVIDER_SITE_OTHER): Payer: Commercial Managed Care - HMO

## 2013-04-23 VITALS — BP 147/74 | HR 82 | Resp 12

## 2013-04-23 DIAGNOSIS — M7750 Other enthesopathy of unspecified foot: Secondary | ICD-10-CM

## 2013-04-23 DIAGNOSIS — L84 Corns and callosities: Secondary | ICD-10-CM

## 2013-04-23 DIAGNOSIS — R52 Pain, unspecified: Secondary | ICD-10-CM

## 2013-04-23 DIAGNOSIS — M19079 Primary osteoarthritis, unspecified ankle and foot: Secondary | ICD-10-CM

## 2013-04-23 NOTE — Progress Notes (Signed)
   Subjective:    Patient ID: [name], female    DOB: 04-22-1922, 80 y.o.   MRN: [id number]  HPI PT STATED LT FOOT HAVE CALLUSES THAT IS VERY PAINFUL FOR 10 YEARS. THE FOOT IS BEEN THE SAME IS NOT GETTING WORSE. THE FOOT GET AGGRAVATED WHEN WALKING. TRIED TO USED SPECIAL MADE BRACE BUT DID NOT HELP.    Review of Systems  Genitourinary: Positive for frequency.       Objective:   Physical Exam        Assessment & Plan:

## 2013-04-24 NOTE — Progress Notes (Signed)
Subjective:     Patient ID: [name], female   DOB: December 30, 1922, 80 y.o.   MRN: [id number]  HPI patient is found present with keratotic lesions sub-left foot x2 that are painful   Review of Systems     Objective:   Physical Exam Neurovascular status unchanged with keratotic lesions that are painful plantar left foot    Assessment:     Lesion formation plantar left that are painful    Plan:     Debridement painful lesions left foot with no bleeding noted and reappoint as needed

## 2013-07-06 ENCOUNTER — Other Ambulatory Visit (INDEPENDENT_AMBULATORY_CARE_PROVIDER_SITE_OTHER): Payer: Self-pay | Admitting: Surgery

## 2013-07-06 DIAGNOSIS — Z853 Personal history of malignant neoplasm of breast: Secondary | ICD-10-CM

## 2013-07-06 DIAGNOSIS — Z9889 Other specified postprocedural states: Secondary | ICD-10-CM

## 2013-08-10 ENCOUNTER — Ambulatory Visit
Admission: RE | Admit: 2013-08-10 | Discharge: 2013-08-10 | Disposition: A | Discharge status: Home / Self Care | Payer: Commercial Managed Care - HMO | Source: Ambulatory Visit | Attending: Surgery | Admitting: Surgery

## 2013-08-10 DIAGNOSIS — Z853 Personal history of malignant neoplasm of breast: Secondary | ICD-10-CM

## 2013-08-10 DIAGNOSIS — Z9889 Other specified postprocedural states: Secondary | ICD-10-CM

## 2013-09-09 ENCOUNTER — Ambulatory Visit (INDEPENDENT_AMBULATORY_CARE_PROVIDER_SITE_OTHER): Payer: Commercial Managed Care - HMO | Admitting: Podiatry

## 2013-09-09 ENCOUNTER — Encounter: Payer: Self-pay | Admitting: Podiatry

## 2013-09-09 DIAGNOSIS — M7750 Other enthesopathy of unspecified foot: Secondary | ICD-10-CM

## 2013-09-09 DIAGNOSIS — L600 Ingrowing nail: Secondary | ICD-10-CM

## 2013-09-09 MED ORDER — TRIAMCINOLONE ACETONIDE 10 MG/ML IJ SUSP
10.0000 mg | Freq: Once | INTRAMUSCULAR | Status: AC
  Administered 2013-09-09: 10 mg

## 2013-09-09 NOTE — Patient Instructions (Signed)

## 2013-09-10 ENCOUNTER — Telehealth: Payer: Self-pay

## 2013-09-10 NOTE — Telephone Encounter (Signed)
Send to PCP Dr. Felipa Eth, pt only sees Cardiology as needed.

## 2013-09-10 NOTE — Progress Notes (Signed)
Subjective:     Patient ID: [name], female   DOB: [birth year], 80 y.o.   MRN: [id number]  HPI patient presents with incurvated right hallux medial and lateral border that are painful and pain in the left foot on the medial side with inflammation noted   Review of Systems     Objective:   Physical Exam Neurovascular status found to be intact with inflammation noted of the medial side the left foot around the navicular and on the right foot I noted an incurvated nail bed both medial and lateral border that are painful when pressed.    Assessment:     Ingrown toenail deformity right hallux tendinitis left with the patient with well-perfused digits and well oriented x3    Plan:     H&P performed and condition discussed explained. I have recommended removal of the nail corners and patient wants this procedure done and I explained the surgery and risk. She understands is no guarantee as far as healing wants procedure and today I infiltrated 60 mg Xylocaine Marcaine mixture removed the corners exposed matrix and applied phenol 3 applications 30 seconds followed by alcohol and sterile dressing and for the left I injected the tendon complex 3 mg Kenalog 5 mg Xylocaine and advised on reduced activities. Reappoint her recheck

## 2013-09-11 ENCOUNTER — Encounter: Payer: Self-pay | Admitting: Podiatry

## 2013-09-11 ENCOUNTER — Ambulatory Visit (INDEPENDENT_AMBULATORY_CARE_PROVIDER_SITE_OTHER): Payer: Commercial Managed Care - HMO | Admitting: Podiatry

## 2013-09-11 VITALS — BP 159/85 | HR 95 | Resp 18

## 2013-09-11 DIAGNOSIS — L600 Ingrowing nail: Secondary | ICD-10-CM

## 2013-09-11 MED ORDER — CEPHALEXIN 500 MG PO CAPS: 500.0000 mg | ORAL_CAPSULE | Freq: Three times a day (TID) | ORAL | Status: DC

## 2013-09-11 NOTE — Progress Notes (Signed)
   Subjective:    Patient ID: [name], female    DOB: 10/29/1922, 80 y.o.   MRN: [id number]  HPI 80 year old female returns the office today status post right medial and lateral border partial nail avulsions with phenol application. Patient states that since Wednesday after the procedure she is noted increased redness, drainage and blister formation around the nail. Pain around the nail. She denies any systemic complaints as fevers, chills, nausea, vomiting. Denies any frank purulence in the area.    Review of Systems  All other systems reviewed and are negative. R hallux nail pain     Objective:   Physical Exam AAO x3, NAD DP/PT pulses palpable b/l. CRT < 3sec Protective sensation intact with Derrel Nip monofilament. Right hallux with localized erythema around the nail borders. There is small amount of serous drainage from around the nail borders. Edema to the digit. 2 serous blisters along the proximal medial/lateral portion of the nail borders. No purulence expressed. No ascending cellulitis.      Assessment & Plan:  80 year old female today status post right hallux medial and lateral border nail avulsions with phenol application. -Treatment options discussed including alternatives, risks, complications. -Prescribed Keflex for possible infection although the redness in the edema is likely due to phenol application.  -Continue with epsom salt soaks/antibiotic ointment dressing daily.  -f/u in 1 week or sooner if any problems are to arise or any change in symptoms. Monitor for any clinical signs/symtpoms of infection. If any are to occur she was directed to call the office immediately or go directly to the emergency room.

## 2013-09-11 NOTE — Patient Instructions (Signed)
Continue with epsom salt soaks twice a day followed by antibiotic ointment/band-aid Monitor for any signs/symptoms of infection. Call the office immediately if any occur or go directly to the emergency room. Call with any questions/concerns.

## 2013-09-18 ENCOUNTER — Encounter: Payer: Self-pay | Admitting: Podiatry

## 2013-09-18 ENCOUNTER — Ambulatory Visit (INDEPENDENT_AMBULATORY_CARE_PROVIDER_SITE_OTHER): Payer: Commercial Managed Care - HMO | Admitting: Podiatry

## 2013-09-18 VITALS — BP 132/60 | HR 92 | Resp 12

## 2013-09-18 DIAGNOSIS — L03039 Cellulitis of unspecified toe: Secondary | ICD-10-CM

## 2013-09-18 MED ORDER — CEPHALEXIN 500 MG PO CAPS: 500.0000 mg | ORAL_CAPSULE | Freq: Three times a day (TID) | ORAL | Status: DC

## 2013-09-18 NOTE — Patient Instructions (Signed)
Continue Epsom salt soaks twice a day followed by antibiotic ointment and a Band-Aid. Continue Keflex. Monitor for any signs/symptoms of infection. Call the office immediately if any occur or go directly to the emergency room. Call with any questions/concerns.

## 2013-09-19 NOTE — Progress Notes (Signed)
Patient ID: [name], female   DOB: July 12, 1922, 80 y.o.   MRN: [id number]  Subjective: [name] turns the office they for followup evaluation status post medial and lateral border partial nail avulsions with phenol application. Last week she presented with worsening pain and erythema around the area. At that time she was placed on Keflex for which she states she has continued to take. She is also been continuing with Epson salt soaks twice a day. She states that she has continued to notice purulence coming from underneath the nail fold and she points to the proximal nail fold. Also states that she has noticed increased pain and swelling in the same area. She is requesting that her entire nail be removed at this time. She denies any systemic complaints such as fevers, chills, nausea, vomiting. No other complaints at this time.  Objective: AAO x3, NAD DP/PT pulses palpable 2/4 b/l. CRT < 3sec Protective sensation intact with Derrel Nip monofilament. Right hallux status post medial and lateral nail border partial nail removal. There is erythema of the hallux to the level of the MTPJ. There is no ascending cellulitis, fluctuance or crepitus. There is serous drainage from the proximal nail fold. There is tenderness to palpation over the proximal nail fold and to the remaining nail. There is mild edema as well around this area. There is mild epidermolysis over the lateral aspect of the hallux.  Assessment: 80 year old female with likely paronychia to remaining right hallux nail.  Plan: -Surgical versus conservative treatment were discussed with the patient including alternatives, risks, complications. -At this time the patient is requesting that her entire nail be removed. Under sterile conditions a total of 2.5 cc of a one-to-one mixture of 2% lidocaine plain and 0.5% Marcaine plain was infiltrated in a hallux block fashion. The hallux was then scrubbed and prepped in the normal sterile  fashion. Next the hallux nail was then removed in total. There is a small amount of purulence in the proximal medial border. Care was taken to ensure removal of the entire nail. The area was then copiously irrigated. Underlying skin intact. Silvadene was applied followed by a dry sterile dressing. Patient tolerated the procedure well without any complications. -Post procedure instructions discussed with the patient for which she verbally understood. -Continue Keflex for one more week. I instructed her and her husband that if the erythema is not responding in the next couple days to call the office and we will consider switching antibiotics. -Follow up in 1 week or sooner if any problems are to arise or any change in symptoms, questions or concerns.   *X-ray next appointment if symptoms unchanged.

## 2013-09-21 ENCOUNTER — Telehealth: Payer: Self-pay | Admitting: Podiatry

## 2013-09-21 NOTE — Telephone Encounter (Signed)
Called [name] to follow up with her after the nail avulsion on Friday. She states she believes it is a little better than it was on Friday. She continues with BID epsom salt soaks and keflex. Denies any f/c/n/v. No other complaints. Discussed that if symptoms worsen to call the office immediately.  F/U as scheduled. Call the office with any questions/concerns in the meantime.

## 2013-09-25 ENCOUNTER — Encounter: Payer: Self-pay | Admitting: Podiatry

## 2013-09-25 ENCOUNTER — Ambulatory Visit (INDEPENDENT_AMBULATORY_CARE_PROVIDER_SITE_OTHER): Payer: Commercial Managed Care - HMO | Admitting: Podiatry

## 2013-09-25 VITALS — BP 132/65 | HR 90 | Resp 18

## 2013-09-25 DIAGNOSIS — Z9889 Other specified postprocedural states: Secondary | ICD-10-CM

## 2013-09-25 DIAGNOSIS — L03039 Cellulitis of unspecified toe: Secondary | ICD-10-CM

## 2013-09-25 NOTE — Patient Instructions (Signed)
Continue soaking with Epson salts twice a day. Cover with antibiotic ointment and a Band-Aid. Continue Keflex. Monitor for any signs/symptoms of infection. Call the office immediately if any occur or go directly to the emergency room. Call with any questions/concerns.

## 2013-09-25 NOTE — Progress Notes (Signed)
   Subjective:    Patient ID: [name], female    DOB: 08-18-1922, 80 y.o.   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 States that his last appointment the redness has decreased as well as the discomfort. Still states that she is mild clear drainage from around the procedure site. Denies any purulence. She has been continuing with Epson salt soaks followed by antibiotic ointment and a Band-Aid. She states that the previous blister sites starting to peel off. She is asking if the sites can be debrided. No other complaints at this time. Denies any systemic complaints such as fevers, chills, nausea, vomiting.    Review of Systems  All other systems reviewed and are negative.      Objective:   Physical Exam AAO x3, NAD DP/PT pulses palpable, CRT < 3sec Neurological status unchanged. Right hallux nail bed site healing appropriately with a small amount of granulation tissue within the nailbed. Mild amount of serous drainage from around the nail borders. There is evidence of dried hyperkeratotic tissue on the medial and lateral aspects of the nail bed over sites of prior bulla formation. Decreased erythema compared to previous visit. Mild tenderness around the nail site although decreased. No fluctuance, crepitus, malodor. No purulence. No leg pain, swelling, warmth.      Assessment & Plan:  80 year old female status post right hallux total nail avulsion due to paronychia. -Area treatment options were discussed the patient in detail including alternatives, risks, complications.  -Continue abx for one more week as there is decreased erythema although it is still persisting slightly.  -Continue soaking with Epsom salts soaks twice a day followed by Neosporin and a Band-Aid until completely healed.  -There was some skin slough on the medial aspect of the hallux which the patient is asking to be  debrided. There was a small amount of bleeding around the site the area was not fully debrided. Continue with soaking in the area should slough off.  Continued antibiotic ointment and a Band-Aid over this area as well. -Monitor for any clinical signs or symptoms of worsening infection and directed to call the office immediately if any are to occur or go directly to the emergency room.  -Followup in 1 week or sooner if any problems are to arise or any change in symptoms. Call the office with any questions, concerns, change in symptoms to

## 2013-10-02 ENCOUNTER — Ambulatory Visit (INDEPENDENT_AMBULATORY_CARE_PROVIDER_SITE_OTHER): Payer: Commercial Managed Care - HMO | Admitting: Podiatry

## 2013-10-02 ENCOUNTER — Encounter: Payer: Self-pay | Admitting: Podiatry

## 2013-10-02 VITALS — BP 141/75 | HR 71 | Resp 18

## 2013-10-02 DIAGNOSIS — Z9889 Other specified postprocedural states: Secondary | ICD-10-CM

## 2013-10-02 DIAGNOSIS — L03039 Cellulitis of unspecified toe: Secondary | ICD-10-CM

## 2013-10-02 NOTE — Patient Instructions (Signed)
Monitor for any signs/symptoms of infection. Call the office immediately if any occur or go directly to the emergency room. Call with any questions/concerns.  

## 2013-10-04 ENCOUNTER — Encounter: Payer: Self-pay | Admitting: *Deleted

## 2013-10-05 NOTE — Progress Notes (Signed)
Patient ID: [name], female   DOB: 1922-05-14, 80 y.o.   MRN: [id number]  Subjective: Ms. Marciano returns the office they for followup evaluation status post right hallux nail avulsion secondary to infection. She states that since last appointment she has had significant decrease in pain, redness, drainage. Currently she denies any systemic complaints such as fevers, chills, nausea, vomiting. She's been continuing with Epson salt soaks followed by antibiotic ointment and a Band-Aid. Currently denies any pain. No new complaints and no acute changes since last appointment.  Objective: AAO x3, NAD Neurovascular status unchanged. Right hallux status post nail avulsion which appears to be healed with overlying callus formation there is a small opening on the proximal nail border. There is no current evidence of any drainage, purulence. Significant decrease in erythema. No tenderness to palpation. No ascending cellulitis. No fluctuance, crepitus, malodor. No calf pain, swelling, warmth. MMT 5/5, ROM WNL  Assessment: 80 year old female status post right hallux nail avulsion secondary to infection currently much improved.  Plan: - Various treatment options were discussed including alternatives, risks, complications. -At this time there is considerable improvement compared to last week. Finish course of antibiotics. -Continue to soak in Epson salts and apply antibiotic ointment and a Band-Aid until completely healed at the nail borders. -Monitor for any signs or symptoms of infection and directed to call the office immediately if any are to occur or go directly to the emergency room. -Followup in 2 weeks or sooner if any problems are to arise or any change in symptoms. Patient states that she will call if she has any problems.

## 2014-01-28 DIAGNOSIS — Z79899 Other long term (current) drug therapy: Secondary | ICD-10-CM | POA: Diagnosis not present

## 2014-01-28 DIAGNOSIS — I10 Essential (primary) hypertension: Secondary | ICD-10-CM | POA: Diagnosis not present

## 2014-01-28 DIAGNOSIS — N390 Urinary tract infection, site not specified: Secondary | ICD-10-CM | POA: Diagnosis not present

## 2014-05-03 ENCOUNTER — Ambulatory Visit: Payer: Commercial Managed Care - HMO | Admitting: Podiatry

## 2014-05-10 ENCOUNTER — Ambulatory Visit (INDEPENDENT_AMBULATORY_CARE_PROVIDER_SITE_OTHER): Payer: Commercial Managed Care - HMO

## 2014-05-10 ENCOUNTER — Ambulatory Visit (INDEPENDENT_AMBULATORY_CARE_PROVIDER_SITE_OTHER): Payer: Commercial Managed Care - HMO | Admitting: Podiatry

## 2014-05-10 ENCOUNTER — Encounter: Payer: Self-pay | Admitting: Podiatry

## 2014-05-10 VITALS — BP 141/60 | HR 79 | Resp 18

## 2014-05-10 DIAGNOSIS — M79676 Pain in unspecified toe(s): Secondary | ICD-10-CM | POA: Diagnosis not present

## 2014-05-10 DIAGNOSIS — M722 Plantar fascial fibromatosis: Secondary | ICD-10-CM | POA: Diagnosis not present

## 2014-05-10 DIAGNOSIS — B351 Tinea unguium: Secondary | ICD-10-CM | POA: Diagnosis not present

## 2014-05-11 NOTE — Progress Notes (Signed)
Patient ID: [name], female   DOB: 1922/10/27, 81 y.o.   MRN: [id number]  Subjective: 81 year old female presents the office today with complaints of left heel pain which has been ongoing for approximately 3 days. She states that the pain is intermittent in nature and is not consistent. She states that sometimes it hurts in the morning when she gets up or after periods of activity. She denies any history of injury or trauma and she denies any change or increase in activity the time of onset of symptoms. She denies any numbness or tingling. Denies any swelling or redness. The pain does not wake her up at night.  No prior treatment. She also states her nails are thickened and discolored and she is on estrogen them himself. She is at the nails are painful particularly shoe gear. No other complaints at this time. No acute changes since last appointment.  Objective: AAO x3, NAD DP/PT pulses palpable bilaterally, CRT less than 3 seconds Protective sensation intact with Simms Weinstein monofilament, vibratory sensation intact, Achilles tendon reflex intact Tenderness to palpation overlying the plantar medial tubercle of the calcaneus to left heel at the insertion of the plantar fascia. There is no pain along the course of plantar fascial within the arch of the foot and the plantar fascia appears intact. There is no pain with lateral compression of the calcaneus or pain the vibratory sensation. No pain on the posterior aspect of the calcaneus or along the course/insertion of the Achilles tendon. There is no overlying edema, erythema, increase in warmth. There is a significant decrease in medial arch height particularly in the left side with arthritic changes of the rear foot. No other areas of tenderness palpation or pain with vibratory sensation to the foot/ankle. MMT 5/5, ROM WNL Nails are hypertrophic, dystrophic, discolored 8. There is a definite tenderness on nails 1-5 on the left and 3-5 on the  right. There is no swelling erythema or drainage on nail sites. No open lesions or pre-ulcerative lesions are identified. No pain with calf compression, swelling, warmth, erythema.  Assessment: 81 year old female with left heel pain, likely plantar fasciitis; symptomatically onychomycosis  Plan: -X-rays were obtained and reviewed the patient. -Treatment options were discussed include alternatives, risks, complications. -Patient elects to proceed with steroid injection into the leftr heel. Under sterile skin preparation, a total of 2.5cc of kenalog 10, 0.5% Marcaine plain, and 2% lidocaine plain were infiltrated into the symptomatic area without complication. A band-aid was applied. Patient tolerated the injection well without complication. Post-injection care with discussed with the patient. Discussed with the patient to ice the area over the next couple of days to help prevent a steroid flare.  -Plantar fascial taping was applied. Once the tape is removed she has a brace at home that she can wear -Ice to the area -Discussed stretching activities. -Discussed shoe gear modifications and not to go barefoot at home.  -Nails debrided x 10 without complications/bleeding.  -Follow-up in 3 weeks or sooner should any problems arise. In the meantime call the office in the questions, concerns, change in symptoms.

## 2014-06-02 ENCOUNTER — Encounter: Payer: Self-pay | Admitting: Podiatry

## 2014-06-02 ENCOUNTER — Ambulatory Visit (INDEPENDENT_AMBULATORY_CARE_PROVIDER_SITE_OTHER): Payer: Commercial Managed Care - HMO | Admitting: Podiatry

## 2014-06-02 VITALS — BP 131/67 | HR 76 | Resp 18

## 2014-06-02 DIAGNOSIS — M722 Plantar fascial fibromatosis: Secondary | ICD-10-CM

## 2014-06-06 NOTE — Progress Notes (Signed)
Patient ID: [name], female   DOB: [birth year], 81 y.o.   MRN: [id number]  Subjective: 81 year old female presents the office they for follow-up evaluation of left heel pain, plantar fasciitis. She states that since last appointment she has had almost complete relief of symptoms. She has been icing and stretching. She had no complications after the injection. Denies any systemic complaints such as fevers, chills, nausea, vomiting. No acute changes since last appointment, and no other complaints at this time.   Objective: AAO x3, NAD DP/PT pulses palpable bilaterally, CRT less than 3 seconds Protective sensation intact with Simms Weinstein monofilament, vibratory sensation intact, Achilles tendon reflex intact There is currently no tenderness to palpation overlying the plantar medial tubercle of the calcaneus to left heel at the insertion of the plantar fascia. There is no pain along the course of plantar fascial within the arch of the foot. There is no pain with lateral compression of the calcaneus or pain the vibratory sensation. No pain on the posterior aspect of the calcaneus or along the course/insertion of the Achilles tendon. There is no overlying edema, erythema, increase in warmth. No other areas of tenderness palpation or pain with vibratory sensation to the foot/ankle. There is significant flattening deformity present bilaterally with obvious arthritis. MMT 5/5, ROM WNL No open lesions or pre-ulcerative lesions are identified. No pain with calf compression, swelling, warmth, erythema.  Assessment: 81 year old female with resolved left heel pain, plantar fasciitis  Plan: -All treatment options discussed with the patient including all alternatives, risks, complications.  -Recommended to continue icing, stretching on a consistent basis. Also continue the plantar fascial brace. Discussed shoe gear modifications. If symptoms persist we'll likely need custom orthotics. -Follow-up as  needed. Encouraged the patient to call the office with any questions, concerns, change in symptoms. -Patient encouraged to call the office with any questions, concerns, change in symptoms.

## 2014-07-05 ENCOUNTER — Other Ambulatory Visit: Payer: Self-pay

## 2014-07-05 ENCOUNTER — Other Ambulatory Visit: Payer: Self-pay | Admitting: Surgery

## 2014-07-07 ENCOUNTER — Other Ambulatory Visit: Payer: Self-pay | Admitting: Geriatric Medicine

## 2014-07-07 DIAGNOSIS — Z853 Personal history of malignant neoplasm of breast: Secondary | ICD-10-CM

## 2014-07-19 DIAGNOSIS — I10 Essential (primary) hypertension: Secondary | ICD-10-CM | POA: Diagnosis not present

## 2014-07-19 DIAGNOSIS — M25561 Pain in right knee: Secondary | ICD-10-CM | POA: Diagnosis not present

## 2014-07-19 DIAGNOSIS — M25562 Pain in left knee: Secondary | ICD-10-CM | POA: Diagnosis not present

## 2014-07-22 DIAGNOSIS — M170 Bilateral primary osteoarthritis of knee: Secondary | ICD-10-CM | POA: Diagnosis not present

## 2014-07-29 DIAGNOSIS — M8589 Other specified disorders of bone density and structure, multiple sites: Secondary | ICD-10-CM | POA: Diagnosis not present

## 2014-07-29 DIAGNOSIS — Z1389 Encounter for screening for other disorder: Secondary | ICD-10-CM | POA: Diagnosis not present

## 2014-07-29 DIAGNOSIS — Z79899 Other long term (current) drug therapy: Secondary | ICD-10-CM | POA: Diagnosis not present

## 2014-07-29 DIAGNOSIS — Z0000 Encounter for general adult medical examination without abnormal findings: Secondary | ICD-10-CM | POA: Diagnosis not present

## 2014-07-29 DIAGNOSIS — I10 Essential (primary) hypertension: Secondary | ICD-10-CM | POA: Diagnosis not present

## 2014-08-11 ENCOUNTER — Ambulatory Visit
Admission: RE | Admit: 2014-08-11 | Discharge: 2014-08-11 | Disposition: A | Discharge status: Home / Self Care | Payer: Commercial Managed Care - HMO | Source: Ambulatory Visit | Attending: Geriatric Medicine | Admitting: Geriatric Medicine

## 2014-08-11 DIAGNOSIS — Z853 Personal history of malignant neoplasm of breast: Secondary | ICD-10-CM | POA: Diagnosis not present

## 2014-08-11 DIAGNOSIS — R928 Other abnormal and inconclusive findings on diagnostic imaging of breast: Secondary | ICD-10-CM | POA: Diagnosis not present

## 2014-09-02 DIAGNOSIS — M859 Disorder of bone density and structure, unspecified: Secondary | ICD-10-CM | POA: Diagnosis not present

## 2014-09-02 DIAGNOSIS — M8589 Other specified disorders of bone density and structure, multiple sites: Secondary | ICD-10-CM | POA: Diagnosis not present

## 2014-09-14 DIAGNOSIS — M170 Bilateral primary osteoarthritis of knee: Secondary | ICD-10-CM | POA: Diagnosis not present

## 2014-11-01 DIAGNOSIS — M170 Bilateral primary osteoarthritis of knee: Secondary | ICD-10-CM | POA: Diagnosis not present

## 2014-11-17 DIAGNOSIS — Z961 Presence of intraocular lens: Secondary | ICD-10-CM | POA: Diagnosis not present

## 2014-11-17 DIAGNOSIS — H02055 Trichiasis without entropian left lower eyelid: Secondary | ICD-10-CM | POA: Diagnosis not present

## 2014-11-17 DIAGNOSIS — H52203 Unspecified astigmatism, bilateral: Secondary | ICD-10-CM | POA: Diagnosis not present

## 2014-11-17 DIAGNOSIS — H02052 Trichiasis without entropian right lower eyelid: Secondary | ICD-10-CM | POA: Diagnosis not present

## 2014-11-29 ENCOUNTER — Ambulatory Visit (INDEPENDENT_AMBULATORY_CARE_PROVIDER_SITE_OTHER): Payer: Commercial Managed Care - HMO | Admitting: Podiatry

## 2014-11-29 ENCOUNTER — Encounter: Payer: Self-pay | Admitting: Podiatry

## 2014-11-29 VITALS — BP 144/59 | HR 86 | Resp 18

## 2014-11-29 DIAGNOSIS — M21969 Unspecified acquired deformity of unspecified lower leg: Secondary | ICD-10-CM | POA: Diagnosis not present

## 2014-11-29 DIAGNOSIS — M722 Plantar fascial fibromatosis: Secondary | ICD-10-CM

## 2014-11-29 MED ORDER — DICLOFENAC SODIUM 1 % TD GEL: 2.0000 g | Freq: Four times a day (QID) | TRANSDERMAL | Status: DC

## 2014-11-29 NOTE — Progress Notes (Signed)
Patient ID: [name], female   DOB: [birth year], 81 y.o.   MRN: [id number]  Subjective: 81 year old female presents the office they for follow-up evaluation of left heel pain, plantar fasciitis.  She states that she has should have reoccurrence of pain approximately 2-3 weeks ago. She says the pain is the same as what it was previously. No recent injury or trauma. No swelling or redness. No tingling or numbness. The pain is in the bottom of her heel which started office in the morning but has become more consistent throbbing pain throughout the day after standing for prolonged periods of time. She had no complications after the injection. Denies any systemic complaints such as fevers, chills, nausea, vomiting. No acute changes since last appointment, and no other complaints at this time.   Objective: AAO x3, NAD DP/PT pulses palpable bilaterally, CRT less than 3 seconds Protective sensation intact with Simms Weinstein monofilament, vibratory sensation intact, Achilles tendon reflex intact There is reoccurrence of tenderness to palpation overlying the plantar medial tubercle of the calcaneus to left heel at the insertion of the plantar fascia. There is no pain along the course of plantar fascial within the arch of the foot. There is no pain with lateral compression of the calcaneus or pain the vibratory sensation. No pain on the posterior aspect of the calcaneus or along the course/insertion of the Achilles tendon. There is no overlying edema, erythema, increase in warmth. No other areas of tenderness palpation or pain with vibratory sensation to the foot/ankle. There is significant flattening deformity present bilaterally with obvious arthritis. There is prominence of the plantar medial aspect of the foot from peritalar subluxation MMT 5/5, ROM WNL No open lesions or pre-ulcerative lesions are identified. No pain with calf compression, swelling, warmth, erythema.  Assessment: 81 year old female  with reoccurrence of left heel pain, plantar fasciitis  Plan: -All treatment options discussed with the patient including all alternatives, risks, complications.  -Etiology of symptoms were discussed -Patient elects to proceed with steroid injection into the left heel. Under sterile skin preparation, a total of 2.5cc of kenalog 10, 0.5% Marcaine plain, and 2% lidocaine plain were infiltrated into the symptomatic area without complication. A band-aid was applied. Patient tolerated the injection well without complication. Post-injection care with discussed with the patient. Discussed with the patient to ice the area over the next couple of days to help prevent a steroid flare.  -Continue stretching and icing exercises for which she has been doing. -Prescribed Voltaren gel. -At today's appointment she was scanned the new orthotics given her foot type. There was also sent to [facility] labs. -Follow-up in 3 weeks to [facility] or sooner if any problems arise. In the meantime, encouraged to call the office with any questions, concerns, change in symptoms.   Celesta Gentile, DPM

## 2014-12-08 DIAGNOSIS — I10 Essential (primary) hypertension: Secondary | ICD-10-CM | POA: Diagnosis not present

## 2014-12-08 DIAGNOSIS — M79604 Pain in right leg: Secondary | ICD-10-CM | POA: Diagnosis not present

## 2014-12-21 ENCOUNTER — Ambulatory Visit: Payer: Commercial Managed Care - HMO | Admitting: *Deleted

## 2014-12-21 DIAGNOSIS — M21969 Unspecified acquired deformity of unspecified lower leg: Secondary | ICD-10-CM

## 2014-12-21 NOTE — Progress Notes (Signed)
Patient ID: [name], female   DOB: May 18, 1922, 81 y.o.   MRN: [id number] Orthotics are tried on and are not a good fit will send back to add a medial flange on left

## 2014-12-21 NOTE — Patient Instructions (Signed)

## 2015-01-26 ENCOUNTER — Ambulatory Visit: Payer: Commercial Managed Care - HMO | Admitting: *Deleted

## 2015-01-26 DIAGNOSIS — M722 Plantar fascial fibromatosis: Secondary | ICD-10-CM

## 2015-01-26 NOTE — Progress Notes (Signed)
Patient ID: [name], female   DOB: February 12, 1922, 82 y.o.   MRN: [id number] Patient presents for orthotic pick up.  Orthotics are still not right sending them back with patients original orthotics for further adjustment.  Will call when they arrive.

## 2015-01-26 NOTE — Patient Instructions (Signed)

## 2015-02-17 DIAGNOSIS — I10 Essential (primary) hypertension: Secondary | ICD-10-CM | POA: Diagnosis not present

## 2015-02-17 DIAGNOSIS — Z79899 Other long term (current) drug therapy: Secondary | ICD-10-CM | POA: Diagnosis not present

## 2015-03-07 ENCOUNTER — Ambulatory Visit
Admission: RE | Admit: 2015-03-07 | Discharge: 2015-03-07 | Disposition: A | Discharge status: Home / Self Care | Payer: Commercial Managed Care - HMO | Source: Ambulatory Visit | Attending: Geriatric Medicine | Admitting: Geriatric Medicine

## 2015-03-07 ENCOUNTER — Other Ambulatory Visit: Payer: Self-pay | Admitting: Geriatric Medicine

## 2015-03-07 DIAGNOSIS — J40 Bronchitis, not specified as acute or chronic: Secondary | ICD-10-CM

## 2015-03-16 ENCOUNTER — Ambulatory Visit
Admission: RE | Admit: 2015-03-16 | Discharge: 2015-03-16 | Disposition: A | Discharge status: Home / Self Care | Payer: Commercial Managed Care - HMO | Source: Ambulatory Visit | Attending: Nurse Practitioner | Admitting: Nurse Practitioner

## 2015-03-16 ENCOUNTER — Other Ambulatory Visit: Payer: Self-pay | Admitting: Nurse Practitioner

## 2015-03-16 DIAGNOSIS — W19XXXA Unspecified fall, initial encounter: Secondary | ICD-10-CM

## 2015-03-16 DIAGNOSIS — S3992XA Unspecified injury of lower back, initial encounter: Secondary | ICD-10-CM | POA: Diagnosis not present

## 2015-03-16 DIAGNOSIS — R102 Pelvic and perineal pain: Secondary | ICD-10-CM | POA: Diagnosis not present

## 2015-03-25 DIAGNOSIS — M545 Low back pain: Secondary | ICD-10-CM | POA: Diagnosis not present

## 2015-04-07 ENCOUNTER — Other Ambulatory Visit: Payer: Self-pay | Admitting: Orthopedic Surgery

## 2015-04-07 DIAGNOSIS — S32020A Wedge compression fracture of second lumbar vertebra, initial encounter for closed fracture: Secondary | ICD-10-CM | POA: Diagnosis not present

## 2015-04-07 DIAGNOSIS — S32009A Unspecified fracture of unspecified lumbar vertebra, initial encounter for closed fracture: Secondary | ICD-10-CM

## 2015-04-07 DIAGNOSIS — G8929 Other chronic pain: Secondary | ICD-10-CM

## 2015-04-07 DIAGNOSIS — M545 Low back pain: Principal | ICD-10-CM

## 2015-04-14 ENCOUNTER — Other Ambulatory Visit: Payer: Self-pay | Admitting: Orthopedic Surgery

## 2015-04-14 ENCOUNTER — Ambulatory Visit
Admission: RE | Admit: 2015-04-14 | Discharge: 2015-04-14 | Disposition: A | Discharge status: Home / Self Care | Payer: Commercial Managed Care - HMO | Source: Ambulatory Visit | Attending: Orthopedic Surgery | Admitting: Orthopedic Surgery

## 2015-04-14 DIAGNOSIS — G8929 Other chronic pain: Secondary | ICD-10-CM

## 2015-04-14 DIAGNOSIS — S32009A Unspecified fracture of unspecified lumbar vertebra, initial encounter for closed fracture: Secondary | ICD-10-CM

## 2015-04-14 DIAGNOSIS — M545 Low back pain, unspecified: Secondary | ICD-10-CM

## 2015-04-14 DIAGNOSIS — M5126 Other intervertebral disc displacement, lumbar region: Secondary | ICD-10-CM | POA: Diagnosis not present

## 2015-04-14 DIAGNOSIS — IMO0002 Reserved for concepts with insufficient information to code with codable children: Secondary | ICD-10-CM

## 2015-04-14 NOTE — Consult Note (Signed)
Chief Complaint: Patient was seen in consultation today for an L2 compression fracture at the request of Basye  Referring Physician(s): Caffrey,Daniel    History of Present Illness: [name] is a 82 y.o. female who fell at home 03/13/15.  She experienced low back pain the following day.  Since that time her pain has been constant.  She rates her pain as 10/10.  She scored 16/24 on a [place] disability questionnaire.    She lives in an assisted living facility and has required increased assistance since the fall.  She is now transported by wheel chair.  She was previously able to ambulate with a cane.  She has not been able to sleep in the bed since the fall.  She sleeps in a recliner.  Her pain is greatest with movement, particularly after she has been sitting or reclining for any length of time.   Past Medical History  Diagnosis Date  . Pneumonia   . Cancer [facility])     breast - right  . Hypertension     Past Surgical History  Procedure Laterality Date  . Abdominal hysterectomy  1977  . Hand surgery  1978  . Toe nail removal  2011    Allergies: Macrobid  Medications: Prior to Admission medications   Medication Sig Start Date End Date Taking? Authorizing Provider  aspirin 81 MG tablet Take 81 mg by mouth daily.      Historical Provider, MD  cephALEXin (KEFLEX) 500 MG capsule Take 1 capsule (500 mg total) by mouth 3 (three) times daily. 09/18/13   Trula Slade, DPM  CRANBERRY PO Take 1 capsule by mouth daily.     Historical Provider, MD  diclofenac sodium (VOLTAREN) 1 % GEL Apply 2 g topically 4 (four) times daily. Rub into affected area of foot 2 to 4 times daily 11/29/14   Trula Slade, DPM  furosemide (LASIX) 40 MG tablet Take 40 mg by mouth Daily.  04/24/11   Historical Provider, MD  Glucosamine Sulfate 1000 MG CAPS Take 1 capsule by mouth daily.     Historical Provider, MD  meloxicam (MOBIC) 7.5 MG tablet Take 7.5 mg by mouth daily.     Historical Provider, MD  methylPREDNIsolone (MEDROL DOSPACK) 4 MG tablet  02/16/13   Historical Provider, MD  naphazoline (CLEAR EYES) 0.012 % ophthalmic solution Place 1 drop into both eyes daily as needed. For dry eyes    Historical Provider, MD  trimethoprim (TRIMPEX) 100 MG tablet  04/20/13   Historical Provider, MD     Family History  Problem Relation Age of Onset  . Stroke Mother   . Cancer Father     Social History   Social History  . Marital Status: Married    Spouse Name: N/A  . Number of Children: N/A  . Years of Education: N/A   Social History Main Topics  . Smoking status: Never Smoker   . Smokeless tobacco: Never Used  . Alcohol Use: Yes     Comment: 1 GLASS A MONTH  . Drug Use: No  . Sexual Activity: Not on file   Other Topics Concern  . Not on file   Social History Narrative    ECOG Status: 2 - Symptomatic, <50% confined to bed  Review of Systems: A 12 point ROS discussed and pertinent positives are indicated in the HPI above.  All other systems are negative.  Review of Systems  All other systems reviewed and are negative.   Vital  Signs: BP 175/65 mmHg  Pulse 78  Temp(Src) 97.7 F (36.5 C) (Oral)  Resp 18  SpO2 99%  Physical Exam  Constitutional: She appears well-developed and well-nourished.  Musculoskeletal:       Lumbar back: She exhibits tenderness and bony tenderness.    Mallampati Score:     Imaging: Dg Pelvis 1-2 Views  03/16/2015  CLINICAL DATA:  Fall onto back 3 nights ago with sacral and posterior pelvis pain, initial encounter. EXAM: PELVIS - 1-2 VIEW COMPARISON:  None. FINDINGS: No definite fracture. Mild medial joint space narrowing in the hips. IMPRESSION: No definite evidence of an acute injury on this single view. Electronically Signed   By: Lorin Picket M.D.   On: 03/16/2015 14:48   Mr Lumbar Spine Wo Contrast  04/14/2015  CLINICAL DATA:  Low back pain since a fall 03/13/2015. Initial encounter. EXAM: MRI LUMBAR SPINE  WITHOUT CONTRAST TECHNIQUE: Multiplanar, multisequence MR imaging of the lumbar spine was performed. No intravenous contrast was administered. COMPARISON:  MRI lumbar spine 07/05/2013. FINDINGS: The patient has an acute or subacute superior endplate compression fracture of L2 with vertebral body height loss of 50%. Remote superior endplate compression fracture of L4 is identified as seen on the prior exam. Vertebral body height is otherwise maintained. Bilateral L5 pars interarticularis defects result in 1.4 cm anterolisthesis L5 on S1. Alignment is otherwise normal. The conus medullaris is normal in signal and position. Imaged intra-abdominal contents are unremarkable. T10-11, T11-12 and T12-L1 are imaged in sagittal plane only and negative. L1-2: Minimal disc bulge without central canal or foraminal stenosis. L2-3: Shallow disc bulge and mild ligamentum flavum thickening without central canal or foraminal stenosis. L3-4: Shallow disc bulge and ligamentum flavum thickening cause mild central canal narrowing. The foramina are open. L4-5: There is some ligamentum flavum thickening and a minimal disc bulge without central canal or foraminal stenosis. L5-S1: The disc is uncovered without bulging. The central canal is widely patent. Moderate right and mild left foraminal narrowing is identified. IMPRESSION: Acute or subacute L2 superior endplate compression fracture with approximately 50% vertebral body height loss but no bony retropulsion. Remote L4 superior endplate compression fracture is noted as seen on the prior study. Chronic bilateral L5 pars interarticularis defects result in 1.4 cm anterolisthesis L5 on S1 and moderate right and mild left foraminal narrowing. Electronically Signed   By: Inge Rise M.D.   On: 04/14/2015 10:01    Labs:  CBC: No results for input(s): WBC, HGB, HCT, PLT in the last 8760 hours.  COAGS: No results for input(s): INR, APTT in the last 8760 hours. BMP: No results for  input(s): NA, K, CL, CO2, GLUCOSE, BUN, CALCIUM, CREATININE, GFRNONAA, GFRAA in the last 8760 hours.  Invalid input(s): CMP  LIVER FUNCTION TESTS: No results for input(s): BILITOT, AST, ALT, ALKPHOS, PROT, ALBUMIN in the last 8760 hours.  TUMOR MARKERS: No results for input(s): AFPTM, CEA, CA199, CHROMGRNA in the last 8760 hours.  Assessment and Plan:  L2 osteoporotic compression fracture with persistent pain greater than one month after the injury resulting in limitation of mobility and restricting activities of daily living.  The fracture appears simple and amenable to vertebral augmention.  We discussed a positive expected outcome of facilitated healing and reduced pain.  We discussed the risks.  I answered all questions.  She wishes to proceed with an L2 Vertebroplasty.  Thank you for this interesting consult.  I greatly enjoyed meeting Mykenzi Strebe and look forward to participating in their care.  A copy of this report was sent to the requesting provider on this date.  Electronically Signed: Jancarlo Biermann W 04/14/2015, 4:29 PM   I spent a total of  15 Minutes   in face to face in clinical consultation, greater than 50% of which was counseling/coordinating care for Mrs. Ackerman.

## 2015-04-19 ENCOUNTER — Ambulatory Visit
Admission: RE | Admit: 2015-04-19 | Discharge: 2015-04-19 | Disposition: A | Discharge status: Home / Self Care | Payer: Commercial Managed Care - HMO | Source: Ambulatory Visit | Attending: Orthopedic Surgery | Admitting: Orthopedic Surgery

## 2015-04-19 VITALS — BP 186/95 | HR 85 | Temp 97.7°F | Resp 21

## 2015-04-19 DIAGNOSIS — IMO0002 Reserved for concepts with insufficient information to code with codable children: Secondary | ICD-10-CM

## 2015-04-19 DIAGNOSIS — M545 Low back pain: Secondary | ICD-10-CM | POA: Diagnosis not present

## 2015-04-19 DIAGNOSIS — M4856XA Collapsed vertebra, not elsewhere classified, lumbar region, initial encounter for fracture: Secondary | ICD-10-CM | POA: Diagnosis not present

## 2015-04-19 DIAGNOSIS — S32000G Wedge compression fracture of unspecified lumbar vertebra, subsequent encounter for fracture with delayed healing: Secondary | ICD-10-CM

## 2015-04-19 MED ORDER — SODIUM CHLORIDE 0.9 % IV SOLN
Freq: Once | INTRAVENOUS | Status: AC
  Administered 2015-04-19: 09:00:00 via INTRAVENOUS

## 2015-04-19 MED ORDER — CEFAZOLIN SODIUM-DEXTROSE 2-4 GM/100ML-% IV SOLN
2.0000 g | Freq: Once | INTRAVENOUS | Status: AC
  Administered 2015-04-19: 2 g via INTRAVENOUS

## 2015-04-19 MED ORDER — KETOROLAC TROMETHAMINE 30 MG/ML IJ SOLN
30.0000 mg | Freq: Once | INTRAMUSCULAR | Status: AC
  Administered 2015-04-19: 30 mg via INTRAVENOUS

## 2015-04-19 MED ORDER — FENTANYL CITRATE (PF) 100 MCG/2ML IJ SOLN
25.0000 ug | INTRAMUSCULAR | Status: DC | PRN
  Administered 2015-04-19: 25 ug via INTRAVENOUS

## 2015-04-19 MED ORDER — MIDAZOLAM HCL 2 MG/2ML IJ SOLN
1.0000 mg | INTRAMUSCULAR | Status: DC | PRN
  Administered 2015-04-19: 1 mg via INTRAVENOUS

## 2015-04-19 NOTE — Discharge Instructions (Signed)
Vertebroplasty Post Procedure Discharge Instructions  1. May resume a regular diet and any medications that you routinely take (including pain medications). 2. No driving day of procedure. 3. Upon discharge go home and rest for at least 4 hours.  May use an ice pack as needed to injection sites on back. 4. Remove bandades after shower in the am and change daily till healed. 5. Do not lift anything heavier than a milk jug. 6. Follow up with your doctor re: bone strengthening medications.    Please contact our office at [phone] for the following symptoms:   Fever greater than 100 degrees  Increased swelling, pain, or redness at injection site.   Thank you for visiting [facility].

## 2015-04-19 NOTE — Progress Notes (Signed)
36 minutes of sedation time for L2 VP.

## 2015-04-26 ENCOUNTER — Telehealth: Payer: Self-pay

## 2015-04-26 NOTE — Telephone Encounter (Signed)
Returned patient's call from this morning regarding new onset of pain across her low back and into both hips that feels "like a toothache."  This pain woke her from sleep at 0400 this morning and has not subsided despite ice and tylenol.  She does not want to take any prescription pain medication, as it causes her severe constipation.  She states this pain is "just like it was before" the L2 VP on 04/19/15 (one week ago).  She understands Dr. Jola Baptist will be back in the office tomorrow and states she is okay waiting for a call from [place] after 0730 04/27/15.  Brita Romp, RN

## 2015-04-27 ENCOUNTER — Telehealth: Payer: Self-pay

## 2015-04-27 NOTE — Telephone Encounter (Signed)
Called patient back this morning to see how her back was feeling after she called [place] yesterday to report new onset of mid-back pain radiating to her hips, "just like before the L2 VP" on 04/19/15.  Today she states the pain is "a little better."  Her husband bought her some "patches" which she is putting on her hips.  Her mid-back hurts mostly only when she coughs.  She states she will call Dr. French Ana about these symptoms and ask about hip injections, or at least to get worked up for the cause of her pain.  Brita Romp, RN

## 2015-04-28 DIAGNOSIS — S32020D Wedge compression fracture of second lumbar vertebra, subsequent encounter for fracture with routine healing: Secondary | ICD-10-CM | POA: Diagnosis not present

## 2015-05-18 DIAGNOSIS — M4316 Spondylolisthesis, lumbar region: Secondary | ICD-10-CM | POA: Diagnosis not present

## 2015-05-18 DIAGNOSIS — M545 Low back pain: Secondary | ICD-10-CM | POA: Diagnosis not present

## 2015-09-15 DIAGNOSIS — I10 Essential (primary) hypertension: Secondary | ICD-10-CM | POA: Diagnosis not present

## 2015-09-15 DIAGNOSIS — Z79899 Other long term (current) drug therapy: Secondary | ICD-10-CM | POA: Diagnosis not present

## 2015-09-15 DIAGNOSIS — Z1389 Encounter for screening for other disorder: Secondary | ICD-10-CM | POA: Diagnosis not present

## 2015-09-15 DIAGNOSIS — I340 Nonrheumatic mitral (valve) insufficiency: Secondary | ICD-10-CM | POA: Diagnosis not present

## 2015-09-15 DIAGNOSIS — G5601 Carpal tunnel syndrome, right upper limb: Secondary | ICD-10-CM | POA: Diagnosis not present

## 2015-09-15 DIAGNOSIS — Z0000 Encounter for general adult medical examination without abnormal findings: Secondary | ICD-10-CM | POA: Diagnosis not present

## 2015-09-29 DIAGNOSIS — I872 Venous insufficiency (chronic) (peripheral): Secondary | ICD-10-CM | POA: Diagnosis not present

## 2015-09-29 DIAGNOSIS — G5601 Carpal tunnel syndrome, right upper limb: Secondary | ICD-10-CM | POA: Diagnosis not present

## 2015-09-29 DIAGNOSIS — I10 Essential (primary) hypertension: Secondary | ICD-10-CM | POA: Diagnosis not present

## 2015-09-29 DIAGNOSIS — M8000XA Age-related osteoporosis with current pathological fracture, unspecified site, initial encounter for fracture: Secondary | ICD-10-CM | POA: Diagnosis not present

## 2015-10-03 DIAGNOSIS — G629 Polyneuropathy, unspecified: Secondary | ICD-10-CM | POA: Diagnosis not present

## 2015-10-03 DIAGNOSIS — G5620 Lesion of ulnar nerve, unspecified upper limb: Secondary | ICD-10-CM | POA: Diagnosis not present

## 2015-10-03 DIAGNOSIS — G5600 Carpal tunnel syndrome, unspecified upper limb: Secondary | ICD-10-CM | POA: Diagnosis not present

## 2015-10-24 DIAGNOSIS — G5603 Carpal tunnel syndrome, bilateral upper limbs: Secondary | ICD-10-CM | POA: Diagnosis not present

## 2015-10-24 DIAGNOSIS — M180 Bilateral primary osteoarthritis of first carpometacarpal joints: Secondary | ICD-10-CM | POA: Diagnosis not present

## 2015-10-24 DIAGNOSIS — G5601 Carpal tunnel syndrome, right upper limb: Secondary | ICD-10-CM | POA: Diagnosis not present

## 2015-11-23 DIAGNOSIS — H02002 Unspecified entropion of right lower eyelid: Secondary | ICD-10-CM | POA: Diagnosis not present

## 2015-11-23 DIAGNOSIS — H02005 Unspecified entropion of left lower eyelid: Secondary | ICD-10-CM | POA: Diagnosis not present

## 2015-11-28 DIAGNOSIS — G5601 Carpal tunnel syndrome, right upper limb: Secondary | ICD-10-CM | POA: Diagnosis not present

## 2015-12-12 DIAGNOSIS — H02002 Unspecified entropion of right lower eyelid: Secondary | ICD-10-CM | POA: Diagnosis not present

## 2015-12-12 DIAGNOSIS — H02005 Unspecified entropion of left lower eyelid: Secondary | ICD-10-CM | POA: Diagnosis not present

## 2015-12-16 ENCOUNTER — Encounter: Payer: Self-pay | Admitting: Podiatry

## 2015-12-16 ENCOUNTER — Ambulatory Visit (INDEPENDENT_AMBULATORY_CARE_PROVIDER_SITE_OTHER): Payer: Commercial Managed Care - HMO

## 2015-12-16 ENCOUNTER — Ambulatory Visit (INDEPENDENT_AMBULATORY_CARE_PROVIDER_SITE_OTHER): Payer: Commercial Managed Care - HMO | Admitting: Podiatry

## 2015-12-16 DIAGNOSIS — S99929A Unspecified injury of unspecified foot, initial encounter: Secondary | ICD-10-CM | POA: Diagnosis not present

## 2015-12-16 DIAGNOSIS — S92345A Nondisplaced fracture of fourth metatarsal bone, left foot, initial encounter for closed fracture: Secondary | ICD-10-CM | POA: Diagnosis not present

## 2015-12-16 DIAGNOSIS — S99922A Unspecified injury of left foot, initial encounter: Secondary | ICD-10-CM | POA: Diagnosis not present

## 2015-12-19 DIAGNOSIS — S92345A Nondisplaced fracture of fourth metatarsal bone, left foot, initial encounter for closed fracture: Secondary | ICD-10-CM

## 2015-12-19 NOTE — Progress Notes (Signed)
Subjective: 82 year old female presents the office today after she sustained a fall last week. She states that she has pain mostly to the top of her left foot she points on the metatarsal head area as well as to the similar area in the right foot. She says the left side hurts more than the right but the left side is worse. She's had no recent treatment. She presents in a wheelchair. Denies any other injury at the time of the fall. She was being treated by nurse at the facility where she lives. Denies any systemic complaints such as fevers, chills, nausea, vomiting. No acute changes since last appointment, and no other complaints at this time.   Objective: AAO x3, NAD DP/PT pulses palpable bilaterally, CRT less than 3 seconds There is tenderness to palpation along the midfoot on the left foot. There is mild-to-moderate edema to the dorsal aspect of the foot there is no erythema or increased warmth. There is no open lesion identified. There is no pain at the toes. There is no pain on the ankle, proximal tib-fib. There is no pain with ankle or subtalar joint range of motion. There is also tenderness to palpation on the right foot submetatarsal 4. There is no pain vibratory sensation that there is pinpoint tenderness this area as well. There is no significant edema to the right foot. There is no erythema or increase in warmth. No open lesions or pre-ulcerative lesions.  No pain with calf compression, swelling, warmth, erythema  Assessment: Left fourth metatarsal base fracture, possible right fourth metatarsal fractures   Plan: -All treatment options discussed with the patient including all alternatives, risks, complications.  -X-rays were obtained and reviewed. On the right foot on the fourth metatarsal base there is an area of radiolucency concerning for fracture. Given that she has tenderness of the long this area will immobilize and a CAM boot which is his been inserted. Lisfranc ligament appears  intact.  -Continue stiff sole shoe on the right foot.  -Ice -Limit activity.  -Patient encouraged to call the office with any questions, concerns, change in symptoms.   *x-ray bilateral feet next appointment*  Celesta Gentile, DPM

## 2016-01-05 ENCOUNTER — Ambulatory Visit (INDEPENDENT_AMBULATORY_CARE_PROVIDER_SITE_OTHER): Payer: Commercial Managed Care - HMO

## 2016-01-05 ENCOUNTER — Ambulatory Visit (INDEPENDENT_AMBULATORY_CARE_PROVIDER_SITE_OTHER): Payer: Commercial Managed Care - HMO | Admitting: Podiatry

## 2016-01-05 ENCOUNTER — Encounter: Payer: Self-pay | Admitting: Podiatry

## 2016-01-05 DIAGNOSIS — S92345D Nondisplaced fracture of fourth metatarsal bone, left foot, subsequent encounter for fracture with routine healing: Secondary | ICD-10-CM

## 2016-01-05 DIAGNOSIS — S92344D Nondisplaced fracture of fourth metatarsal bone, right foot, subsequent encounter for fracture with routine healing: Secondary | ICD-10-CM

## 2016-01-06 NOTE — Progress Notes (Signed)
Subjective: 82 year old female presents the office today with her husband for follow-up evaluation of bilateral foot fractures after a fall. She states that she still is on the left foot that she denies any pain. She states that the right foot is never been painful. She presents today walking with a cane. She states that she has worn a regular shoe with on both feet due to the bathroom around the house and she's having no pain and feels better in regular shoes in the boot. Denies any systemic complaints such as fevers, chills, nausea, vomiting. No acute changes since last appointment, and no other complaints at this time.   Objective: AAO x3, NAD DP/PT pulses palpable bilaterally, CRT less than 3 seconds There is continued tenderness along the left foot along the fifth metatarsal head as well as the fourth metatarsal base. There is no specific other areas of tenderness identified at this time. There is minimal pain to the right fourth metatarsal head on palpation but she has not noticed any pain with walking. There is minimal edema to the right foot. This no erythema or increase in warmth bilaterally. No open lesions or pre-ulcerative lesions.  No pain with calf compression, swelling, warmth, erythema  Assessment: Multiple fractures bilaterally  Plan: -All treatment options discussed with the patient including all alternatives, risks, complications.  -Repeat x-rays were obtained. There is a fracture the left foot fourth metatarsal base and left fifth metatarsal neck. Also fracture of the right fourth metatarsal neck. -Recommended continued immobilization. The CAM boot is difficult for her to wear so I will place her in a surgical shoe. On the right side she has not worn any immobilization. There does not appear to be any significant displacement of the fracture and she's having no pain we'll continue with regular shoe. Ultimately like for her to be mobilized bilaterally but given her age and fall  risk we will hold off on bilateral immobilization. -Continue ice elevation and limited activity. Her husband has been using a wheelchair with her which I would recommend he continue with. -Patient encouraged to call the office with any questions, concerns, change in symptoms.   Celesta Gentile, DPM

## 2016-01-26 ENCOUNTER — Ambulatory Visit: Payer: Commercial Managed Care - HMO | Admitting: Podiatry

## 2016-02-01 DIAGNOSIS — M180 Bilateral primary osteoarthritis of first carpometacarpal joints: Secondary | ICD-10-CM | POA: Diagnosis not present

## 2016-02-01 DIAGNOSIS — G5601 Carpal tunnel syndrome, right upper limb: Secondary | ICD-10-CM | POA: Diagnosis not present

## 2016-02-02 ENCOUNTER — Ambulatory Visit (INDEPENDENT_AMBULATORY_CARE_PROVIDER_SITE_OTHER): Payer: Medicare HMO | Admitting: Podiatry

## 2016-02-02 ENCOUNTER — Ambulatory Visit (INDEPENDENT_AMBULATORY_CARE_PROVIDER_SITE_OTHER): Payer: Medicare HMO

## 2016-02-02 DIAGNOSIS — S92309D Fracture of unspecified metatarsal bone(s), unspecified foot, subsequent encounter for fracture with routine healing: Secondary | ICD-10-CM | POA: Diagnosis not present

## 2016-02-02 DIAGNOSIS — S92302S Fracture of unspecified metatarsal bone(s), left foot, sequela: Secondary | ICD-10-CM | POA: Diagnosis not present

## 2016-02-02 DIAGNOSIS — S92309A Fracture of unspecified metatarsal bone(s), unspecified foot, initial encounter for closed fracture: Secondary | ICD-10-CM

## 2016-02-02 NOTE — Progress Notes (Signed)
Subjective: 83 year old female presents the office today with her husband for follow-up evaluation of bilateral foot fractures after a fall. She states that she has no pain to her feet or ankles. She has been back in a regular shoe for over 1 week and increasing her walking with no pain or increase in swelling. She states the surgical shoe was uncomfortable.  Denies any systemic complaints such as fevers, chills, nausea, vomiting. No acute changes since last appointment, and no other complaints at this time.   Objective: AAO x3, NAD DP/PT pulses palpable bilaterally, CRT less than 3 seconds There is currently notenderness along the left foot along the fifth metatarsal head as well as the fourth metatarsal base. There is no other areas of tenderness identified at this time. There is no pain to the right fourth metatarsal head on palpation but she has not noticed any pain with walking. There is minimal edema to the right foot. This no erythema or increase in warmth bilaterally. She states "I have no pain anywhere".  No open lesions or pre-ulcerative lesions.  No pain with calf compression, swelling, warmth, erythema  Assessment: Multiple fractures bilaterally improved with no pain.   Plan: -All treatment options discussed with the patient including all alternatives, risks, complications.  -Repeat x-rays were obtained. There is a fracture the left foot fourth metatarsal base and left fifth metatarsal neck. Also fracture of the right fourth metatarsal neck. There is evidence of healing.  -She has returned to a regular shoe with no issues. Continue with this but if she has any increase in pain she is to return to the CAM boot.  -Follow-up as needed. If at any time there are any increase in symptoms to call the office.  -Patient encouraged to call the office with any questions, concerns, change in symptoms.   [name], DPM

## 2016-02-09 ENCOUNTER — Encounter (HOSPITAL_BASED_OUTPATIENT_CLINIC_OR_DEPARTMENT_OTHER): Admission: RE | Payer: Self-pay | Source: Ambulatory Visit

## 2016-02-09 ENCOUNTER — Ambulatory Visit (HOSPITAL_BASED_OUTPATIENT_CLINIC_OR_DEPARTMENT_OTHER): Admission: RE | Admit: 2016-02-09 | Payer: Medicare HMO | Source: Ambulatory Visit | Admitting: Orthopedic Surgery

## 2016-02-09 SURGERY — CARPAL TUNNEL RELEASE
Anesthesia: Regional | Laterality: Right

## 2016-02-10 DIAGNOSIS — G5601 Carpal tunnel syndrome, right upper limb: Secondary | ICD-10-CM | POA: Diagnosis not present

## 2016-02-24 DIAGNOSIS — M65331 Trigger finger, right middle finger: Secondary | ICD-10-CM | POA: Diagnosis not present

## 2016-02-27 ENCOUNTER — Ambulatory Visit (INDEPENDENT_AMBULATORY_CARE_PROVIDER_SITE_OTHER): Payer: Medicare HMO | Admitting: Podiatry

## 2016-02-27 ENCOUNTER — Ambulatory Visit (INDEPENDENT_AMBULATORY_CARE_PROVIDER_SITE_OTHER): Payer: Medicare HMO

## 2016-02-27 DIAGNOSIS — S92309A Fracture of unspecified metatarsal bone(s), unspecified foot, initial encounter for closed fracture: Secondary | ICD-10-CM | POA: Diagnosis not present

## 2016-02-27 DIAGNOSIS — M722 Plantar fascial fibromatosis: Secondary | ICD-10-CM | POA: Diagnosis not present

## 2016-02-27 DIAGNOSIS — M79672 Pain in left foot: Secondary | ICD-10-CM

## 2016-02-27 DIAGNOSIS — S92301G Fracture of unspecified metatarsal bone(s), right foot, subsequent encounter for fracture with delayed healing: Secondary | ICD-10-CM

## 2016-02-29 NOTE — Progress Notes (Signed)
Subjective: 83 year old female presents the also concerns of pain to the bottom of her heel. She states that this is the same that she had previously when she is treated for plantar fasciitis. She states the injections helped for short time the pain starts to come back. She presents today asking about EPAT. He says the pain that she was having from the fractures has resolved this is not currently her pain. She has no pain at this areas. Denies any systemic complaints such as fevers, chills, nausea, vomiting. No acute changes since last appointment, and no other complaints at this time.   Objective: AAO x3, NAD DP/PT pulses palpable bilaterally, CRT less than 3 seconds Tenderness to palpation along the plantar medial tubercle of the calcaneus at the insertion of plantar fascia on the left foot. There is no pain along the course of the plantar fascia within the arch of the foot. Plantar fascia appears to be intact. There is no pain with lateral compression of the calcaneus or pain with vibratory sensation. There is no pain along the course or insertion of the achilles tendon. No other areas of tenderness to bilateral lower extremities. There is no area of tenderness bilateral lower chemise along the area of the previous fractures. No open lesions or pre-ulcerative lesions.  No pain with calf compression, swelling, warmth, erythema  Assessment: Left heel pain due to biomechanical changes/plantar fasciitis  Plan: -All treatment options discussed with the patient including all alternatives, risks, complications.  -X-rays were obtained and reviewed. Evidence of healing fractures of the metatarsals however there is no acute evidence of fracture to the calcaneus. After discussion with the patient in regards to treatment options she is less proceed with EPAT therapy. She completed her first treatment today without complications.- -Patient encouraged to call the office with any questions, concerns, change in  symptoms.  -RTC in 1 week or sooner if needed  Celesta Gentile DPM

## 2016-03-05 ENCOUNTER — Ambulatory Visit (INDEPENDENT_AMBULATORY_CARE_PROVIDER_SITE_OTHER): Payer: Self-pay | Admitting: Podiatry

## 2016-03-05 DIAGNOSIS — M722 Plantar fascial fibromatosis: Secondary | ICD-10-CM

## 2016-03-09 NOTE — Progress Notes (Signed)
   Subjective:    Patient ID: [name], female    DOB: 26-Dec-1922, 83 y.o.   MRN: [id number]  HPI  Pt presents today with ongoing pain in her left heel. She is here to start ESWT therapy.   Review of Systems    All other systems negative Objective:   Physical Exam  Pain on palpation of Lt heel along the outer edge with some medial band involvement      Assessment & Plan:  ESWT administered to Lt heel for 17 joules and tolerated well. EPAt administered to surrounding connective tissues for 3000 pulses. Advised on supportive shoe usage and avoidance of NSAIDS. She is to follow up in 1 week for 2nd treatment

## 2016-03-12 ENCOUNTER — Ambulatory Visit: Payer: Self-pay | Admitting: Podiatry

## 2016-03-12 ENCOUNTER — Ambulatory Visit: Payer: Medicare HMO

## 2016-03-12 DIAGNOSIS — R52 Pain, unspecified: Secondary | ICD-10-CM

## 2016-03-12 DIAGNOSIS — M722 Plantar fascial fibromatosis: Secondary | ICD-10-CM

## 2016-03-14 NOTE — Progress Notes (Signed)
   Subjective:    Patient ID: [name], female    DOB: March 18, 1922, 83 y.o.   MRN: [id number]  HPI  Pt presents today with ongoing pain in her left heel. She states that the pain has improved slightly, but her foot still hurts with prolonged walking.   Review of Systems    All other systems negative Objective:   Physical Exam  Pain on palpation of Lt heel along the outer edge with some medial band involvement      Assessment & Plan:  ESWT administered to Lt heel for 20 joules and tolerated well. EPAt administered to surrounding connective tissues for 3000 pulses. Advised on supportive shoe usage and avoidance of NSAIDS. She is to follow up in 1 week for 3rd treatment

## 2016-03-15 DIAGNOSIS — I340 Nonrheumatic mitral (valve) insufficiency: Secondary | ICD-10-CM | POA: Diagnosis not present

## 2016-03-15 DIAGNOSIS — I10 Essential (primary) hypertension: Secondary | ICD-10-CM | POA: Diagnosis not present

## 2016-03-23 ENCOUNTER — Ambulatory Visit: Payer: Medicare HMO

## 2016-03-23 ENCOUNTER — Ambulatory Visit (INDEPENDENT_AMBULATORY_CARE_PROVIDER_SITE_OTHER): Payer: Self-pay | Admitting: Podiatry

## 2016-03-23 DIAGNOSIS — M722 Plantar fascial fibromatosis: Secondary | ICD-10-CM

## 2016-03-23 DIAGNOSIS — M65331 Trigger finger, right middle finger: Secondary | ICD-10-CM | POA: Diagnosis not present

## 2016-03-29 NOTE — Progress Notes (Signed)
   Subjective:    Patient ID: [name], female    DOB: 1922/06/10, 83 y.o.   MRN: [id number]  HPI Pt has not noticed much improvement since last visit, she states that her entire heel hurts Review of Systems    All other systems negative Objective:   Physical Exam  Pain on palpation of Lt heel along the outer edge with some medial band involvement      Assessment & Plan:  ESWT administered to Lt heel for 20 joules and tolerated well. EPAt administered to surrounding connective tissues for 3000 pulses. Advised on supportive shoe usage and avoidance of NSAIDS. She is to follow up in 1 month for 4th treatment

## 2016-04-27 ENCOUNTER — Ambulatory Visit (INDEPENDENT_AMBULATORY_CARE_PROVIDER_SITE_OTHER): Payer: Medicare HMO | Admitting: Podiatry

## 2016-04-27 DIAGNOSIS — M722 Plantar fascial fibromatosis: Secondary | ICD-10-CM

## 2016-04-30 NOTE — Progress Notes (Signed)
   Subjective:    Patient ID: [name], female    DOB: 28-Jan-1922, 83 y.o.   MRN: [id number]  HPI Pt has noticed a lot of  improvement since last visit. Review of Systems    All other systems negative Objective:   Physical Exam  Pain on palpation of Lt heel along the outer edge with some medial band involvement      Assessment & Plan:  ESWT administered to Lt heel for 18 joules and tolerated well. EPAt administered to surrounding connective tissues for 3000 pulses. Advised on supportive shoe usage and avoidance of NSAIDS. She is to follow up in 4 weeks with Dr Jacqualyn Posey

## 2016-05-11 DIAGNOSIS — M65331 Trigger finger, right middle finger: Secondary | ICD-10-CM | POA: Diagnosis not present

## 2016-05-17 DIAGNOSIS — I10 Essential (primary) hypertension: Secondary | ICD-10-CM | POA: Diagnosis not present

## 2016-05-25 ENCOUNTER — Encounter: Payer: Self-pay | Admitting: Podiatry

## 2016-05-25 ENCOUNTER — Ambulatory Visit (INDEPENDENT_AMBULATORY_CARE_PROVIDER_SITE_OTHER): Payer: Medicare HMO | Admitting: Podiatry

## 2016-05-25 DIAGNOSIS — M722 Plantar fascial fibromatosis: Secondary | ICD-10-CM

## 2016-05-25 NOTE — Progress Notes (Signed)
Subjective: 83 year old female presents the office they for follow-up evaluation of left heel pain. She states that after the treatments she is doing much better and she is having no pain. She has some pain along the prominence of the bone on the bottom of her foot (talar head from the significant flatfoot), but this is only intermittently mild. She denies any recent injury or trauma. Denies any swelling or redness to her feet. Denies any systemic complaints such as fevers, chills, nausea, vomiting. No acute changes since last appointment, and no other complaints at this time.   Objective: AAO x3, NAD DP/PT pulses palpable bilaterally, CRT less than 3 seconds Significant flatfoot deformities present bilaterally. There is no tenderness palpation on the plantar medial aspect of the calcaneus at insertion of plantar fascia on the course of plantar fascia. Along the talar head prominence the plantar aspect of the foot there is a minimal discomfort. There is faint erythema along this area as well as navicular tuberosity from irritation shoes but is no skin breakdown.  No open lesions or pre-ulcerative lesions.  No pain with calf compression, swelling, warmth, erythema  Assessment: 83 year old female with resolved plantar fasciitis   Plan: -All treatment options discussed with the patient including all alternatives, risks, complications.  -At this point her symptoms are resolved. I recommended a small gel insert actually to help take more pressure off the bony prominences. Discussed custom accommodative inserts as well. Continue supportive shoe gear. Continue stretching, icing for the plantar fasciitis months for any recurrence. -RTC as needed.  -Patient encouraged to call the office with any questions, concerns, change in symptoms.   Celesta Gentile, DPM

## 2016-08-27 DIAGNOSIS — M810 Age-related osteoporosis without current pathological fracture: Secondary | ICD-10-CM | POA: Diagnosis not present

## 2016-08-27 DIAGNOSIS — I10 Essential (primary) hypertension: Secondary | ICD-10-CM | POA: Diagnosis not present

## 2016-08-27 DIAGNOSIS — M150 Primary generalized (osteo)arthritis: Secondary | ICD-10-CM | POA: Diagnosis not present

## 2016-09-27 DIAGNOSIS — Z79899 Other long term (current) drug therapy: Secondary | ICD-10-CM | POA: Diagnosis not present

## 2016-09-27 DIAGNOSIS — I340 Nonrheumatic mitral (valve) insufficiency: Secondary | ICD-10-CM | POA: Diagnosis not present

## 2016-09-27 DIAGNOSIS — Z1389 Encounter for screening for other disorder: Secondary | ICD-10-CM | POA: Diagnosis not present

## 2016-09-27 DIAGNOSIS — Z23 Encounter for immunization: Secondary | ICD-10-CM | POA: Diagnosis not present

## 2016-09-27 DIAGNOSIS — I10 Essential (primary) hypertension: Secondary | ICD-10-CM | POA: Diagnosis not present

## 2016-09-27 DIAGNOSIS — M810 Age-related osteoporosis without current pathological fracture: Secondary | ICD-10-CM | POA: Diagnosis not present

## 2016-09-27 DIAGNOSIS — Z0000 Encounter for general adult medical examination without abnormal findings: Secondary | ICD-10-CM | POA: Diagnosis not present

## 2016-12-19 DIAGNOSIS — H26493 Other secondary cataract, bilateral: Secondary | ICD-10-CM | POA: Diagnosis not present

## 2017-01-10 DIAGNOSIS — H26492 Other secondary cataract, left eye: Secondary | ICD-10-CM | POA: Diagnosis not present

## 2017-01-24 DIAGNOSIS — H26491 Other secondary cataract, right eye: Secondary | ICD-10-CM | POA: Diagnosis not present

## 2017-02-28 DIAGNOSIS — B9789 Other viral agents as the cause of diseases classified elsewhere: Secondary | ICD-10-CM | POA: Diagnosis not present

## 2017-02-28 DIAGNOSIS — I10 Essential (primary) hypertension: Secondary | ICD-10-CM | POA: Diagnosis not present

## 2017-02-28 DIAGNOSIS — J069 Acute upper respiratory infection, unspecified: Secondary | ICD-10-CM | POA: Diagnosis not present

## 2017-03-28 DIAGNOSIS — I10 Essential (primary) hypertension: Secondary | ICD-10-CM | POA: Diagnosis not present

## 2017-05-27 DIAGNOSIS — M7582 Other shoulder lesions, left shoulder: Secondary | ICD-10-CM | POA: Diagnosis not present

## 2017-05-27 DIAGNOSIS — R58 Hemorrhage, not elsewhere classified: Secondary | ICD-10-CM | POA: Diagnosis not present

## 2017-07-18 ENCOUNTER — Ambulatory Visit: Payer: Medicare HMO | Admitting: Podiatry

## 2017-07-18 DIAGNOSIS — M79674 Pain in right toe(s): Secondary | ICD-10-CM

## 2017-07-18 DIAGNOSIS — M79672 Pain in left foot: Secondary | ICD-10-CM | POA: Diagnosis not present

## 2017-07-18 DIAGNOSIS — M79675 Pain in left toe(s): Secondary | ICD-10-CM | POA: Diagnosis not present

## 2017-07-18 DIAGNOSIS — B351 Tinea unguium: Secondary | ICD-10-CM | POA: Diagnosis not present

## 2017-07-21 NOTE — Progress Notes (Signed)
Subjective: 84 year old female presents the office today for concerns of painful bone spur in the bottom of her left foot.  She recently underwent shockwave therapy for plantar fasciitis.She states that her pain is significantly improved and that she is no longer having any back pain.  She is asked that the shockwave can be gone for more the arch of her foot.  She also has a callus to the arch of the foot.  Her nails are very thick and elongated and cause irritation inside shoes but denies any redness or drainage from the toenail sites.  She has no other concerns.Denies any systemic complaints such as fevers, chills, nausea, vomiting. No acute changes since last appointment, and no other complaints at this time.   Objective: AAO x3, NAD DP/PT pulses palpable bilaterally, CRT less than 3 seconds Significant flatfoot deformity is present.  There is a callus on the plantar medial arch of the foot.  This is the majority of her discomfort is localized.  There is no underlying ulceration drainage or any signs of infection.  Her heel pain is resolved.  There is no other areas of tenderness.   Nails are hypertrophic, dystrophic, brittle, discolored, elongated 10. No surrounding redness or drainage. Tenderness nails 1-5 bilaterally. No open lesions or pre-ulcerative lesions are identified today. No pain with calf compression, swelling, warmth, erythema  Assessment: Arch pain, bony prominence left midfoot; symptomatic onychomycosis  Plan: -All treatment options discussed with the patient including all alternatives, risks, complications.  -Patient was to have shockwave therapy performed in the arch of her foot.  I discussed this is not to get rid of the callus of the bone spur.  We did discussion and reports that this pain even after I talked to her that I am not sure if this will be helpful she wants to proceed with this.  We will focus on shockwave more for the arch of the foot to see if this would be  helpful.  I did debride the callus but any complications or bleeding.  Also debrided her nails without any complications or bleeding. -Patient encouraged to call the office with any questions, concerns, change in symptoms.  -We will reappoint her with Janett Billow for shockwave  Trula Slade DPM

## 2017-07-25 ENCOUNTER — Ambulatory Visit: Payer: Medicare HMO

## 2017-07-25 DIAGNOSIS — M79672 Pain in left foot: Secondary | ICD-10-CM

## 2017-07-26 NOTE — Progress Notes (Signed)
Mrs. Tecson presents with ongoing pain in her arch and bony prominences of her left foot. She states that it hurts to walk barefoot.   Redness and swelling around areas on her left foot  ESWT administered to arch and around bony prominences for 4 joules. EPAt adminstered as well. Tolerated well. Follow up in 1 week for 2nd treatment

## 2017-08-01 ENCOUNTER — Ambulatory Visit: Payer: Self-pay

## 2017-08-01 DIAGNOSIS — M722 Plantar fascial fibromatosis: Secondary | ICD-10-CM

## 2017-08-01 DIAGNOSIS — M79676 Pain in unspecified toe(s): Secondary | ICD-10-CM

## 2017-08-06 NOTE — Progress Notes (Signed)
[name] presents with ongoing pain in her arch and bony prominences of her left foot. She states that it hurts to walk barefoot.  She says that she has not noticed any improvement yet.  Redness and swelling around bony prominences on her left foot  ESWT administered to arch and around bony prominences for 5 joules. EPAt adminstered as well. Tolerated well. Follow up in 1 week for third treatment.

## 2017-08-07 ENCOUNTER — Ambulatory Visit: Payer: Self-pay

## 2017-08-07 DIAGNOSIS — B351 Tinea unguium: Secondary | ICD-10-CM

## 2017-08-07 DIAGNOSIS — M722 Plantar fascial fibromatosis: Secondary | ICD-10-CM

## 2017-08-07 NOTE — Progress Notes (Signed)
[name] presents with ongoing pain in her arch and bony prominences of her left foot. She states that it hurts to walk barefoot.  She says that she has not noticed any improvement yet.  Redness and swelling around bony prominences on her left foot  ESWT administered to arch and around bony prominences for 9 joules. EPAt adminstered as well. Tolerated well. Follow up prn.

## 2017-08-08 ENCOUNTER — Other Ambulatory Visit: Payer: Medicare HMO

## 2017-10-03 DIAGNOSIS — Z79899 Other long term (current) drug therapy: Secondary | ICD-10-CM | POA: Diagnosis not present

## 2017-10-03 DIAGNOSIS — Z23 Encounter for immunization: Secondary | ICD-10-CM | POA: Diagnosis not present

## 2017-10-03 DIAGNOSIS — Z0000 Encounter for general adult medical examination without abnormal findings: Secondary | ICD-10-CM | POA: Diagnosis not present

## 2017-10-03 DIAGNOSIS — Z1389 Encounter for screening for other disorder: Secondary | ICD-10-CM | POA: Diagnosis not present

## 2017-10-03 DIAGNOSIS — I340 Nonrheumatic mitral (valve) insufficiency: Secondary | ICD-10-CM | POA: Diagnosis not present

## 2017-10-03 DIAGNOSIS — I10 Essential (primary) hypertension: Secondary | ICD-10-CM | POA: Diagnosis not present

## 2017-12-17 DIAGNOSIS — L03115 Cellulitis of right lower limb: Secondary | ICD-10-CM | POA: Diagnosis not present

## 2018-01-16 ENCOUNTER — Other Ambulatory Visit: Payer: Self-pay | Admitting: Internal Medicine

## 2018-01-16 ENCOUNTER — Ambulatory Visit
Admission: RE | Admit: 2018-01-16 | Discharge: 2018-01-16 | Disposition: A | Discharge status: Home / Self Care | Payer: Medicare HMO | Source: Ambulatory Visit | Attending: Internal Medicine | Admitting: Internal Medicine

## 2018-01-16 DIAGNOSIS — N644 Mastodynia: Secondary | ICD-10-CM

## 2018-01-16 DIAGNOSIS — W19XXXA Unspecified fall, initial encounter: Secondary | ICD-10-CM

## 2018-01-16 DIAGNOSIS — S20219A Contusion of unspecified front wall of thorax, initial encounter: Secondary | ICD-10-CM | POA: Diagnosis not present

## 2018-01-16 DIAGNOSIS — R0789 Other chest pain: Secondary | ICD-10-CM | POA: Diagnosis not present

## 2018-01-16 DIAGNOSIS — R0781 Pleurodynia: Secondary | ICD-10-CM | POA: Diagnosis not present

## 2018-01-16 DIAGNOSIS — S299XXA Unspecified injury of thorax, initial encounter: Secondary | ICD-10-CM | POA: Diagnosis not present

## 2018-01-20 ENCOUNTER — Ambulatory Visit: Payer: Medicare HMO

## 2018-01-20 ENCOUNTER — Ambulatory Visit
Admission: RE | Admit: 2018-01-20 | Discharge: 2018-01-20 | Disposition: A | Discharge status: Home / Self Care | Payer: Medicare HMO | Source: Ambulatory Visit | Attending: Internal Medicine | Admitting: Internal Medicine

## 2018-01-20 DIAGNOSIS — Z853 Personal history of malignant neoplasm of breast: Secondary | ICD-10-CM | POA: Diagnosis not present

## 2018-01-20 DIAGNOSIS — R928 Other abnormal and inconclusive findings on diagnostic imaging of breast: Secondary | ICD-10-CM | POA: Diagnosis not present

## 2018-01-20 DIAGNOSIS — N644 Mastodynia: Secondary | ICD-10-CM

## 2018-01-20 DIAGNOSIS — R0789 Other chest pain: Secondary | ICD-10-CM

## 2018-01-21 DIAGNOSIS — N644 Mastodynia: Secondary | ICD-10-CM | POA: Diagnosis not present

## 2018-03-10 DIAGNOSIS — Z961 Presence of intraocular lens: Secondary | ICD-10-CM | POA: Diagnosis not present

## 2018-03-10 DIAGNOSIS — H02002 Unspecified entropion of right lower eyelid: Secondary | ICD-10-CM | POA: Diagnosis not present

## 2018-03-10 DIAGNOSIS — H02005 Unspecified entropion of left lower eyelid: Secondary | ICD-10-CM | POA: Diagnosis not present

## 2018-03-20 ENCOUNTER — Other Ambulatory Visit: Payer: Self-pay | Admitting: Internal Medicine

## 2018-03-20 DIAGNOSIS — M25562 Pain in left knee: Secondary | ICD-10-CM | POA: Diagnosis not present

## 2018-03-20 DIAGNOSIS — M7122 Synovial cyst of popliteal space [Baker], left knee: Secondary | ICD-10-CM

## 2018-03-24 ENCOUNTER — Other Ambulatory Visit: Payer: Self-pay

## 2018-03-24 ENCOUNTER — Ambulatory Visit
Admission: RE | Admit: 2018-03-24 | Discharge: 2018-03-24 | Disposition: A | Discharge status: Home / Self Care | Payer: Medicare HMO | Source: Ambulatory Visit | Attending: Internal Medicine | Admitting: Internal Medicine

## 2018-03-24 DIAGNOSIS — M7122 Synovial cyst of popliteal space [Baker], left knee: Secondary | ICD-10-CM

## 2018-03-25 DIAGNOSIS — M1712 Unilateral primary osteoarthritis, left knee: Secondary | ICD-10-CM | POA: Diagnosis not present

## 2018-03-25 DIAGNOSIS — M25562 Pain in left knee: Secondary | ICD-10-CM | POA: Diagnosis not present

## 2018-06-12 DIAGNOSIS — I10 Essential (primary) hypertension: Secondary | ICD-10-CM | POA: Diagnosis not present

## 2018-06-12 DIAGNOSIS — T148XXA Other injury of unspecified body region, initial encounter: Secondary | ICD-10-CM | POA: Diagnosis not present

## 2018-06-27 DIAGNOSIS — L03116 Cellulitis of left lower limb: Secondary | ICD-10-CM | POA: Diagnosis not present

## 2018-06-27 DIAGNOSIS — I872 Venous insufficiency (chronic) (peripheral): Secondary | ICD-10-CM | POA: Diagnosis not present

## 2018-06-27 DIAGNOSIS — I10 Essential (primary) hypertension: Secondary | ICD-10-CM | POA: Diagnosis not present

## 2018-06-27 DIAGNOSIS — L97222 Non-pressure chronic ulcer of left calf with fat layer exposed: Secondary | ICD-10-CM | POA: Diagnosis not present

## 2018-07-03 DIAGNOSIS — I10 Essential (primary) hypertension: Secondary | ICD-10-CM | POA: Diagnosis not present

## 2018-07-03 DIAGNOSIS — L03116 Cellulitis of left lower limb: Secondary | ICD-10-CM | POA: Diagnosis not present

## 2018-07-09 ENCOUNTER — Emergency Department (HOSPITAL_COMMUNITY): Payer: Medicare HMO

## 2018-07-09 ENCOUNTER — Encounter (HOSPITAL_COMMUNITY): Payer: Self-pay

## 2018-07-09 ENCOUNTER — Inpatient Hospital Stay (HOSPITAL_COMMUNITY)
Admission: EM | Admit: 2018-07-09 | Discharge: 2018-07-15 | DRG: 481 | Disposition: A | Discharge status: Skilled Nursing Facility | Payer: Medicare HMO | Attending: Family Medicine | Admitting: Family Medicine

## 2018-07-09 ENCOUNTER — Other Ambulatory Visit: Payer: Self-pay

## 2018-07-09 ENCOUNTER — Inpatient Hospital Stay (HOSPITAL_COMMUNITY): Payer: Medicare HMO

## 2018-07-09 DIAGNOSIS — Z853 Personal history of malignant neoplasm of breast: Secondary | ICD-10-CM

## 2018-07-09 DIAGNOSIS — S92345A Nondisplaced fracture of fourth metatarsal bone, left foot, initial encounter for closed fracture: Secondary | ICD-10-CM | POA: Diagnosis present

## 2018-07-09 DIAGNOSIS — J189 Pneumonia, unspecified organism: Secondary | ICD-10-CM | POA: Diagnosis not present

## 2018-07-09 DIAGNOSIS — Z111 Encounter for screening for respiratory tuberculosis: Secondary | ICD-10-CM | POA: Diagnosis not present

## 2018-07-09 DIAGNOSIS — M25551 Pain in right hip: Secondary | ICD-10-CM | POA: Diagnosis not present

## 2018-07-09 DIAGNOSIS — N179 Acute kidney failure, unspecified: Secondary | ICD-10-CM | POA: Diagnosis not present

## 2018-07-09 DIAGNOSIS — I10 Essential (primary) hypertension: Secondary | ICD-10-CM | POA: Diagnosis present

## 2018-07-09 DIAGNOSIS — R5381 Other malaise: Secondary | ICD-10-CM | POA: Diagnosis not present

## 2018-07-09 DIAGNOSIS — Z881 Allergy status to other antibiotic agents status: Secondary | ICD-10-CM

## 2018-07-09 DIAGNOSIS — Z79899 Other long term (current) drug therapy: Secondary | ICD-10-CM | POA: Diagnosis not present

## 2018-07-09 DIAGNOSIS — Z1159 Encounter for screening for other viral diseases: Secondary | ICD-10-CM | POA: Diagnosis not present

## 2018-07-09 DIAGNOSIS — L97921 Non-pressure chronic ulcer of unspecified part of left lower leg limited to breakdown of skin: Secondary | ICD-10-CM | POA: Diagnosis not present

## 2018-07-09 DIAGNOSIS — W19XXXA Unspecified fall, initial encounter: Secondary | ICD-10-CM | POA: Diagnosis not present

## 2018-07-09 DIAGNOSIS — W010XXA Fall on same level from slipping, tripping and stumbling without subsequent striking against object, initial encounter: Secondary | ICD-10-CM | POA: Diagnosis present

## 2018-07-09 DIAGNOSIS — R52 Pain, unspecified: Secondary | ICD-10-CM | POA: Diagnosis not present

## 2018-07-09 DIAGNOSIS — Y9301 Activity, walking, marching and hiking: Secondary | ICD-10-CM | POA: Diagnosis present

## 2018-07-09 DIAGNOSIS — S72141D Displaced intertrochanteric fracture of right femur, subsequent encounter for closed fracture with routine healing: Secondary | ICD-10-CM | POA: Diagnosis not present

## 2018-07-09 DIAGNOSIS — Z7401 Bed confinement status: Secondary | ICD-10-CM | POA: Diagnosis not present

## 2018-07-09 DIAGNOSIS — Z90710 Acquired absence of both cervix and uterus: Secondary | ICD-10-CM

## 2018-07-09 DIAGNOSIS — S72101A Unspecified trochanteric fracture of right femur, initial encounter for closed fracture: Secondary | ICD-10-CM | POA: Diagnosis not present

## 2018-07-09 DIAGNOSIS — Z66 Do not resuscitate: Secondary | ICD-10-CM | POA: Diagnosis present

## 2018-07-09 DIAGNOSIS — Z419 Encounter for procedure for purposes other than remedying health state, unspecified: Secondary | ICD-10-CM

## 2018-07-09 DIAGNOSIS — R8281 Pyuria: Secondary | ICD-10-CM | POA: Diagnosis present

## 2018-07-09 DIAGNOSIS — S0990XA Unspecified injury of head, initial encounter: Secondary | ICD-10-CM | POA: Diagnosis not present

## 2018-07-09 DIAGNOSIS — Z01818 Encounter for other preprocedural examination: Secondary | ICD-10-CM | POA: Diagnosis not present

## 2018-07-09 DIAGNOSIS — S199XXA Unspecified injury of neck, initial encounter: Secondary | ICD-10-CM | POA: Diagnosis not present

## 2018-07-09 DIAGNOSIS — R2689 Other abnormalities of gait and mobility: Secondary | ICD-10-CM | POA: Diagnosis not present

## 2018-07-09 DIAGNOSIS — M6281 Muscle weakness (generalized): Secondary | ICD-10-CM | POA: Diagnosis not present

## 2018-07-09 DIAGNOSIS — M2550 Pain in unspecified joint: Secondary | ICD-10-CM | POA: Diagnosis not present

## 2018-07-09 DIAGNOSIS — E559 Vitamin D deficiency, unspecified: Secondary | ICD-10-CM | POA: Diagnosis not present

## 2018-07-09 DIAGNOSIS — S72141A Displaced intertrochanteric fracture of right femur, initial encounter for closed fracture: Principal | ICD-10-CM | POA: Diagnosis present

## 2018-07-09 DIAGNOSIS — M25572 Pain in left ankle and joints of left foot: Secondary | ICD-10-CM | POA: Diagnosis not present

## 2018-07-09 DIAGNOSIS — Z9181 History of falling: Secondary | ICD-10-CM | POA: Diagnosis not present

## 2018-07-09 DIAGNOSIS — S72142A Displaced intertrochanteric fracture of left femur, initial encounter for closed fracture: Secondary | ICD-10-CM | POA: Diagnosis not present

## 2018-07-09 DIAGNOSIS — S72001A Fracture of unspecified part of neck of right femur, initial encounter for closed fracture: Secondary | ICD-10-CM | POA: Diagnosis present

## 2018-07-09 DIAGNOSIS — Z03818 Encounter for observation for suspected exposure to other biological agents ruled out: Secondary | ICD-10-CM | POA: Diagnosis not present

## 2018-07-09 DIAGNOSIS — Z791 Long term (current) use of non-steroidal anti-inflammatories (NSAID): Secondary | ICD-10-CM | POA: Diagnosis not present

## 2018-07-09 DIAGNOSIS — M1711 Unilateral primary osteoarthritis, right knee: Secondary | ICD-10-CM | POA: Diagnosis not present

## 2018-07-09 LAB — BASIC METABOLIC PANEL
Anion gap: 6 (ref 5–15)
BUN: 35 mg/dL — ABNORMAL HIGH (ref 8–23)
CO2: 25 mmol/L (ref 22–32)
Calcium: 8.7 mg/dL — ABNORMAL LOW (ref 8.9–10.3)
Chloride: 106 mmol/L (ref 98–111)
Creatinine, Ser: 1.36 mg/dL — ABNORMAL HIGH (ref 0.44–1.00)
GFR calc Af Amer: 39 mL/min — ABNORMAL LOW (ref >60)
GFR calc non Af Amer: 33 mL/min — ABNORMAL LOW (ref >60)
Glucose, Bld: 133 mg/dL — ABNORMAL HIGH (ref 70–99)
Potassium: 4.5 mmol/L (ref 3.5–5.1)
Sodium: 137 mmol/L (ref 135–145)

## 2018-07-09 LAB — CBC WITH DIFFERENTIAL/PLATELET
Abs Immature Granulocytes: 0.03 10*3/uL (ref 0.00–0.07)
Basophils Absolute: 0 10*3/uL (ref 0.0–0.1)
Basophils Relative: 0 %
Eosinophils Absolute: 0 10*3/uL (ref 0.0–0.5)
Eosinophils Relative: 1 %
HCT: 35.6 % — ABNORMAL LOW (ref 36.0–46.0)
Hemoglobin: 11.6 g/dL — ABNORMAL LOW (ref 12.0–15.0)
Immature Granulocytes: 0 %
Lymphocytes Relative: 19 %
Lymphs Abs: 1.6 10*3/uL (ref 0.7–4.0)
MCH: 34.4 pg — ABNORMAL HIGH (ref 26.0–34.0)
MCHC: 32.6 g/dL (ref 30.0–36.0)
MCV: 105.6 fL — ABNORMAL HIGH (ref 80.0–100.0)
Monocytes Absolute: 0.8 10*3/uL (ref 0.1–1.0)
Monocytes Relative: 10 %
Neutro Abs: 6 10*3/uL (ref 1.7–7.7)
Neutrophils Relative %: 70 %
Platelets: 271 10*3/uL (ref 150–400)
RBC: 3.37 MIL/uL — ABNORMAL LOW (ref 3.87–5.11)
RDW: 13.4 % (ref 11.5–15.5)
WBC: 8.5 10*3/uL (ref 4.0–10.5)
nRBC: 0 % (ref 0.0–0.2)

## 2018-07-09 LAB — TYPE AND SCREEN
ABO/RH(D): O NEG
Antibody Screen: NEGATIVE

## 2018-07-09 LAB — SARS CORONAVIRUS 2 BY RT PCR (HOSPITAL ORDER, PERFORMED IN ~~LOC~~ HOSPITAL LAB): SARS Coronavirus 2: NEGATIVE

## 2018-07-09 LAB — PROTIME-INR
INR: 1.3 — ABNORMAL HIGH (ref 0.8–1.2)
Prothrombin Time: 15.8 seconds — ABNORMAL HIGH (ref 11.4–15.2)

## 2018-07-09 MED ORDER — SODIUM CHLORIDE 0.9 % IV BOLUS
500.0000 mL | Freq: Once | INTRAVENOUS | Status: AC
  Administered 2018-07-09: 21:00:00 500 mL via INTRAVENOUS

## 2018-07-09 MED ORDER — FENTANYL CITRATE (PF) 100 MCG/2ML IJ SOLN
50.0000 ug | INTRAMUSCULAR | Status: AC | PRN
  Administered 2018-07-09 – 2018-07-10 (×2): 50 ug via INTRAVENOUS
  Filled 2018-07-09 (×2): qty 2

## 2018-07-09 MED ORDER — MORPHINE SULFATE (PF) 4 MG/ML IV SOLN
4.0000 mg | Freq: Once | INTRAVENOUS | Status: AC
  Administered 2018-07-09: 22:00:00 4 mg via INTRAVENOUS
  Filled 2018-07-09: qty 1

## 2018-07-09 NOTE — Progress Notes (Signed)
Consult received. Patient has comminuted R IT femur fx. Hospitalist admission pending. Plan for OR tomorrow. NPO after MN, hold chemical DVT ppx.  OK to continue ASA. Full consult note to follow tomorrow.

## 2018-07-09 NOTE — ED Provider Notes (Signed)
[place] DEPT Provider Note   CSN: [id number] Arrival date & time: 07/09/18  1752    History   Chief Complaint Chief Complaint  Patient presents with   Fall   Hip Pain    HPI [name] is a 85 y.o. female.     HPI  85 year old female presents after a fall.  She was trying to put her hand on a chair and slipped.  She thinks she briefly hit her head but denies any headache and does not think there is any mark.  She is on a blood thinner but cannot remember why or what it is.  She is having 8 out of 10 pain in her right lateral hip.  She was placed in a c-collar.  No neck pain prior to the c-collar and now it is uncomfortable.  Has seen an orthopedist in an outside state but no one local.  Past Medical History:  Diagnosis Date   Cancer [facility])    breast - right   Hypertension    Pneumonia     Patient Active Problem List   Diagnosis Date Noted   Essential hypertension 07/09/2018   AKI (acute kidney injury) ([place]) 07/09/2018   Closed right hip fracture ([place]) 07/09/2018   Closed nondisplaced fracture of fourth metatarsal bone of left foot 12/19/2015   History of breast cancer 11/27/2011    Past Surgical History:  Procedure Laterality Date   ABDOMINAL HYSTERECTOMY  1977   HAND SURGERY  1978   TOE NAIL REMOVAL  2011     OB History   No obstetric history on file.      Home Medications    Prior to Admission medications   Medication Sig Start Date End Date Taking? Authorizing Provider  amLODipine (NORVASC) 2.5 MG tablet Take 2.5 mg by mouth daily.   Yes [provider]  calcium-vitamin D 250-100 MG-UNIT tablet Take 1 tablet by mouth 2 (two) times daily.   Yes [provider]  Cranberry 500 MG CAPS Take by mouth.   Yes [provider]  diphenhydrAMINE (BENADRYL) 25 MG tablet Take 25 mg by mouth at bedtime.   Yes [provider]  furosemide (LASIX) 40 MG tablet Take 40 mg by mouth  Daily.  04/24/11  Yes [provider]  Glucosamine Sulfate 1000 MG CAPS Take 1 capsule by mouth daily.    Yes [provider]  naproxen sodium (ALEVE) 220 MG tablet Take 220 mg by mouth daily.   Yes [provider]  sulfamethoxazole-trimethoprim (BACTRIM DS) 800-160 MG tablet Take 1 tablet by mouth 2 (two) times daily.   Yes [provider]  diclofenac sodium (VOLTAREN) 1 % GEL Apply 2 g topically 4 (four) times daily. Rub into affected area of foot 2 to 4 times daily Patient not taking: Reported on 07/09/2018 11/29/14   Trula Slade, DPM    Family History Family History  Problem Relation Age of Onset   Stroke Mother    Cancer Father     Social History Social History   Tobacco Use   Smoking status: Never Smoker   Smokeless tobacco: Never Used  Substance Use Topics   Alcohol use: Yes    Comment: 1 GLASS A MONTH   Drug use: No     Allergies   Macrobid [nitrofurantoin macrocrystal]   Review of Systems Review of Systems  Respiratory: Negative for shortness of breath.   Cardiovascular: Negative for chest pain.  Musculoskeletal: Positive for arthralgias. Negative for  neck pain.  Neurological: Negative for weakness, light-headedness, numbness and headaches.  All other systems reviewed and are negative.    Physical Exam Updated Vital Signs BP (!) 122/58    Pulse (!) 48    Temp 98.7 F (37.1 C) (Oral)    Resp 19    Ht 5' 3.5" (1.613 m)    Wt 71.2 kg    SpO2 93%    BMI 27.38 kg/m   Physical Exam Vitals signs and nursing note reviewed.  Constitutional:      Appearance: She is well-developed.     Interventions: Cervical collar in place.  HENT:     Head: Normocephalic and atraumatic.     Right Ear: External ear normal.     Left Ear: External ear normal.     Nose: Nose normal.  Eyes:     General:        Right eye: No discharge.        Left eye: No discharge.  Cardiovascular:     Rate and Rhythm: Normal rate and regular  rhythm.     Pulses:          Dorsalis pedis pulses are 2+ on the right side.     Heart sounds: Normal heart sounds.  Pulmonary:     Effort: Pulmonary effort is normal.     Breath sounds: Normal breath sounds.  Abdominal:     Palpations: Abdomen is soft.     Tenderness: There is no abdominal tenderness.  Musculoskeletal:     Right hip: She exhibits decreased range of motion (mild) and tenderness (lateral).     Right knee: No tenderness found.     Right ankle: No tenderness.     Right upper leg: She exhibits no tenderness.     Right lower leg: She exhibits no tenderness.     Right foot: No tenderness.     Comments: R leg slightly shortened compared to left Normal strength/sensation in right foot  Skin:    General: Skin is warm and dry.  Neurological:     Mental Status: She is alert.  Psychiatric:        Mood and Affect: Mood is not anxious.      ED Treatments / Results  Labs (all labs ordered are listed, but only abnormal results are displayed) Labs Reviewed  BASIC METABOLIC PANEL - Abnormal; Notable for the following components:      Result Value   Glucose, Bld 133 (*)    BUN 35 (*)    Creatinine, Ser 1.36 (*)    Calcium 8.7 (*)    GFR calc non Af Amer 33 (*)    GFR calc Af Amer 39 (*)    All other components within normal limits  CBC WITH DIFFERENTIAL/PLATELET - Abnormal; Notable for the following components:   RBC 3.37 (*)    Hemoglobin 11.6 (*)    HCT 35.6 (*)    MCV 105.6 (*)    MCH 34.4 (*)    All other components within normal limits  PROTIME-INR - Abnormal; Notable for the following components:   Prothrombin Time 15.8 (*)    INR 1.3 (*)    All other components within normal limits  SARS CORONAVIRUS 2 (HOSPITAL ORDER, [place] [facility])  CREATININE, URINE, RANDOM  SODIUM, URINE, RANDOM  TYPE AND SCREEN  ABO/RH    EKG EKG Interpretation  Date/Time:  Wednesday July 09 2018 18:31:37 EDT Ventricular Rate:  83 PR Interval:  QRS  Duration: 89 QT Interval:  383 QTC Calculation: 450 R Axis:   70 Text Interpretation:  Sinus rhythm Multiform ventricular premature complexes Borderline repolarization abnormality les frequent PVCs compared to June 2013 Confirmed by Sherwood Gambler [phone]) on 07/09/2018 6:39:28 PM   Radiology Ct Head Wo Contrast  Result Date: 07/09/2018 CLINICAL DATA:  85 year old female status post fall on anticoagulation. EXAM: CT HEAD WITHOUT CONTRAST CT CERVICAL SPINE WITHOUT CONTRAST TECHNIQUE: Multidetector CT imaging of the head and cervical spine was performed following the standard protocol without intravenous contrast. Multiplanar CT image reconstructions of the cervical spine were also generated. COMPARISON:  Brain MRI 06/15/2011. FINDINGS: CT HEAD FINDINGS Brain: Stable cerebral volume. Chronic cerebral white matter changes appear similar to the prior MRI. No midline shift, ventriculomegaly, mass effect, evidence of mass lesion, intracranial hemorrhage or evidence of cortically based acute infarction. No cortical encephalomalacia identified. Vascular: Calcified atherosclerosis at the skull base. No suspicious intracranial vascular hyperdensity. Skull: No fracture identified. Sinuses/Orbits: Visualized paranasal sinuses and mastoids are stable and well pneumatized. Other: No scalp hematoma. Postoperative changes to both globes since the prior MRI. CT CERVICAL SPINE FINDINGS Alignment: Mild reversal of cervical lordosis. Mild degenerative appearing anterolisthesis at C3-C4 and C4-C5. Cervicothoracic junction alignment is within normal limits. Bilateral posterior element alignment is within normal limits. Skull base and vertebrae: Visualized skull base is intact. No atlanto-occipital dissociation. No acute osseous abnormality identified. Soft tissues and spinal canal: No prevertebral fluid or swelling. No visible canal hematoma. Mild calcified carotid atherosclerosis. Disc levels: Advanced left side facet  arthropathy. Disc and endplate degeneration maximal at C5-C6. However, mild if any cervical spinal stenosis. Upper chest: Visible upper thoracic levels appear intact. Negative lung apices. IMPRESSION: 1. No acute traumatic injury identified in the head or cervical spine. 2. Negative for age non contrast CT appearance of the brain. 3. Cervical spine degeneration. Electronically Signed   By: Genevie Ann M.D.   On: 07/09/2018 22:44   Ct Cervical Spine Wo Contrast  Result Date: 07/09/2018 CLINICAL DATA:  85 year old female status post fall on anticoagulation. EXAM: CT HEAD WITHOUT CONTRAST CT CERVICAL SPINE WITHOUT CONTRAST TECHNIQUE: Multidetector CT imaging of the head and cervical spine was performed following the standard protocol without intravenous contrast. Multiplanar CT image reconstructions of the cervical spine were also generated. COMPARISON:  Brain MRI 06/15/2011. FINDINGS: CT HEAD FINDINGS Brain: Stable cerebral volume. Chronic cerebral white matter changes appear similar to the prior MRI. No midline shift, ventriculomegaly, mass effect, evidence of mass lesion, intracranial hemorrhage or evidence of cortically based acute infarction. No cortical encephalomalacia identified. Vascular: Calcified atherosclerosis at the skull base. No suspicious intracranial vascular hyperdensity. Skull: No fracture identified. Sinuses/Orbits: Visualized paranasal sinuses and mastoids are stable and well pneumatized. Other: No scalp hematoma. Postoperative changes to both globes since the prior MRI. CT CERVICAL SPINE FINDINGS Alignment: Mild reversal of cervical lordosis. Mild degenerative appearing anterolisthesis at C3-C4 and C4-C5. Cervicothoracic junction alignment is within normal limits. Bilateral posterior element alignment is within normal limits. Skull base and vertebrae: Visualized skull base is intact. No atlanto-occipital dissociation. No acute osseous abnormality identified. Soft tissues and spinal canal: No  prevertebral fluid or swelling. No visible canal hematoma. Mild calcified carotid atherosclerosis. Disc levels: Advanced left side facet arthropathy. Disc and endplate degeneration maximal at C5-C6. However, mild if any cervical spinal stenosis. Upper chest: Visible upper thoracic levels appear intact. Negative lung apices. IMPRESSION: 1. No acute traumatic injury identified in the head or cervical spine. 2. Negative for  age non contrast CT appearance of the brain. 3. Cervical spine degeneration. Electronically Signed   By: Genevie Ann M.D.   On: 07/09/2018 22:44   Dg Knee Right Port  Result Date: 07/09/2018 CLINICAL DATA:  Displaced intertrochanteric fracture EXAM: PORTABLE RIGHT KNEE - 1-2 VIEW COMPARISON:  None. FINDINGS: Moderate tricompartment degenerative changes with joint space narrowing and spurring, most pronounced in the medial compartment. No acute bony abnormality. Specifically, no fracture, subluxation, or dislocation. No joint effusion. IMPRESSION: Degenerative changes.  No acute bony abnormality. Electronically Signed   By: Rolm Baptise M.D.   On: 07/09/2018 21:44   Dg Hip Unilat With Pelvis 2-3 Views Right  Result Date: 07/09/2018 CLINICAL DATA:  85 year old female status post fall at home with pain. EXAM: DG HIP (WITH OR WITHOUT PELVIS) 2-3V RIGHT COMPARISON:  Pelvis radiograph 03/16/2015. FINDINGS: Comminuted intertrochanteric fracture of the right femur with varus impaction. Mild displacement of the lesser trochanter butterfly fragment. The right femoral head remains normally located. No fracture of the pelvis identified. Grossly intact proximal left femur. Negative visible bowel gas pattern. Partially visible lumbar vertebral augmentation. IMPRESSION: Comminuted intertrochanteric fracture of the right femur with varus impaction. Electronically Signed   By: Genevie Ann M.D.   On: 07/09/2018 19:58    Procedures Procedures (including critical care time)  Medications Ordered in ED Medications    fentaNYL (SUBLIMAZE) injection 50 mcg (50 mcg Intravenous Given 07/09/18 1827)  sodium chloride 0.9 % bolus 500 mL (0 mLs Intravenous Stopped 07/09/18 2159)  morphine 4 MG/ML injection 4 mg (4 mg Intravenous Given 07/09/18 2131)     Initial Impression / Assessment and Plan / ED Course  I have reviewed the triage vital signs and the nursing notes.  Pertinent labs & imaging results that were available during my care of the patient were reviewed by me and considered in my medical decision making (see chart for details).        X-ray confirms hip fracture.  I discussed with Dr. Lyla Glassing, he asked for her to be n.p.o. after midnight and will repair tomorrow.  Pharmacy confirms the patient is not actually on a blood thinner.  CT head and C-spine are benign.  C-collar removed.  Pain is better after IV morphine.  Dr. Roel Cluck to admit.  Kellyanne Ellwanger was evaluated in Emergency Department on 07/09/2018 for the symptoms described in the history of present illness. She was evaluated in the context of the global COVID-19 pandemic, which necessitated consideration that the patient might be at risk for infection with the SARS-CoV-2 virus that causes COVID-19. Institutional protocols and algorithms that pertain to the evaluation of patients at risk for COVID-19 are in a state of rapid change based on information released by regulatory bodies including the CDC and federal and state organizations. These policies and algorithms were followed during the patient's care in the ED.   Final Clinical Impressions(s) / ED Diagnoses   Final diagnoses:  Closed intertrochanteric fracture of hip, right, initial encounter [facility])    ED Discharge Orders    None       Sherwood Gambler, MD 07/09/18 2336

## 2018-07-09 NOTE — H&P (Signed)
[name] CHE:[id number] DOB: October 21, 1922 DOA: 07/09/2018     PCP: Lajean Manes, MD   Outpatient Specialists:  NONE     Patient arrived to ER on 07/09/18 at 1752  Patient coming from:  From facility [facility].  Chief Complaint:  Chief Complaint  Patient presents with   Fall   Hip Pain    HPI: [name] is a 85 y.o. female with medical history significant of HTN, Breast caner   L2 compression fracture old,   Presented with   fall while walking with her walker and was leaning to the sofa and tipped over she hit her head on the floor no LOC She is on aspirin Reports significant 8 out of 10 right hip pain was not able to bear any weight.  EMS was called patient was placed on c-collar  No prior history of any heart disease or heart attacks  Infectious risk factors:  Reports none  In  ER RAPID COVID TEST NEGATIVE     Regarding pertinent Chronic problems:     HTN on lasix   Breast cancer remote currently in remission  While in ER:  The following Work up has been ordered so far:  Orders Placed This Encounter  Procedures   SARS Coronavirus 2 (CEPHEID - Performed in [place] hospital lab), [place]   DG Hip Unilat With Pelvis 2-3 Views Right   DG Knee Right Port   DG CHEST PORT 1 VIEW   Basic metabolic panel   CBC WITH DIFFERENTIAL   Protime-INR   Creatinine, urine, random   Sodium, urine, random   Diet NPO time specified   Check CMS   Bed rest   Initiate [place] Fluid Protocol   Vital signs   Check temperature   Do not attempt resuscitation (DNR)   Consult to orthopedic surgery  ALL PATIENTS BEING ADMITTED/HAVING PROCEDURES NEED COVID-19 SCREENING   Consult to hospitalist  ALL PATIENTS BEING ADMITTED/HAVING PROCEDURES NEED COVID-19 SCREENING   Consult to wound, ostomy, continence   ED EKG   EKG 12-Lead   Type and screen [place]   ABO/Rh   Insert peripheral IV   Admit to Inpatient (patient's expected length of stay will be greater than 2 midnights or inpatient only procedure)     Following Medications were ordered in ER: Medications  fentaNYL (SUBLIMAZE) injection 50 mcg (50 mcg Intravenous Given 07/09/18 1827)  sodium chloride 0.9 % bolus 500 mL (0 mLs Intravenous Stopped 07/09/18 2159)  morphine 4 MG/ML injection 4 mg (4 mg Intravenous Given 07/09/18 2131)        Consult Orders  (From admission, onward)         Start     Ordered   07/10/18 0010  Consult to wound, ostomy, continence  Once    Provider:  (Not yet assigned)  Question:  Reason for Consult?  Answer:  left leg wound   07/10/18 0009   07/09/18 2248  Consult to hospitalist  Dr. Roel Cluck  Once    Comments: ALL PATIENTS BEING ADMITTED/HAVING PROCEDURES NEED COVID-19 SCREENING  Provider:  (Not yet assigned)  Question Answer Comment  Place call to: [facility]   Reason for Consult Admit      07/09/18 2247          ER Provider Called:    Dr. Lyla Glassing They Recommend admit to medicine    Will see  in AM    Significant initial  Findings: Abnormal Labs Reviewed  BASIC METABOLIC PANEL - Abnormal; Notable for the following components:      Result Value   Glucose, Bld 133 (*)    BUN 35 (*)    Creatinine, Ser 1.36 (*)    Calcium 8.7 (*)    GFR calc non Af Amer 33 (*)    GFR calc Af Amer 39 (*)    All other components within normal limits  CBC WITH DIFFERENTIAL/PLATELET - Abnormal; Notable for the following components:   RBC 3.37 (*)    Hemoglobin 11.6 (*)    HCT 35.6 (*)    MCV 105.6 (*)    MCH 34.4 (*)    All other components within normal limits  PROTIME-INR - Abnormal; Notable for the following components:   Prothrombin Time 15.8 (*)    INR 1.3 (*)    All other components within normal limits    Otherwise labs showing:    Recent Labs  Lab 07/09/18 1852  NA 137  K 4.5  CO2 25  GLUCOSE 133*  BUN  35*  CREATININE 1.36*  CALCIUM 8.7*    Cr    Up from baseline see below Lab Results  Component Value Date   CREATININE 1.36 (H) 07/09/2018   CREATININE 1.0 09/01/2012   CREATININE 0.8 11/28/2011    No results for input(s): AST, ALT, ALKPHOS, BILITOT, PROT, ALBUMIN in the last 168 hours. Lab Results  Component Value Date   CALCIUM 8.7 (L) 07/09/2018      WBC      Component Value Date/Time   WBC 8.5 07/09/2018 1852   ANC    Component Value Date/Time   NEUTROABS 6.0 07/09/2018 1852   NEUTROABS 3.3 09/01/2012 1009   ALC No results found for: LYMPHOABS    Plt: Lab Results  Component Value Date   PLT 271 07/09/2018     COVID-19 Labs    Lab Results  Component Value Date   SARSCOV2NAA NEGATIVE 07/09/2018     HG/HCT  Stable,     Component Value Date/Time   HGB 11.6 (L) 07/09/2018 1852   HGB 13.6 09/01/2012 1009   HCT 35.6 (L) 07/09/2018 1852   HCT 40.4 09/01/2012 1009      UA not ordered   CT HEAD /neck NON acute  CXR - ordered   Hip fracture is comminuted intertrochanteric fracture of the right femur  ECG:  Personally reviewed by me showing: HR : 83 Rhythm:  NSR   no evidence of ischemic changes, frequent PVCs QTC 450      ED Triage Vitals  Enc Vitals Group     BP 07/09/18 1900 (!) 175/61     Pulse Rate 07/09/18 1900 87     Resp 07/09/18 1900 17     Temp 07/09/18 1930 98.7 F (37.1 C)     Temp Source 07/09/18 1930 Oral     SpO2 07/09/18 1800 97 %     Weight 07/09/18 1808 157 lb (71.2 kg)     Height 07/09/18 1807 5' 3.5" (1.613 m)     Head Circumference --      Peak Flow --      Pain Score 07/09/18 1807 8     Pain Loc --      Pain Edu? --      Excl. in [place]? --   TMAX(24)@       Latest  Blood pressure (!) 122/58, pulse (!) 48, temperature  98.7 F (37.1 C), temperature source Oral, resp. rate 19, height 5' 3.5" (1.613 m), weight 71.2 kg, SpO2 93 %.     Hospitalist was called for admission for right intertrochanteric hip  fracture   Review of Systems:    Pertinent positives include: fall, right hip pain  Constitutional:  No weight loss, night sweats, Fevers, chills, fatigue, weight loss  HEENT:  No headaches, Difficulty swallowing,Tooth/dental problems,Sore throat,  No sneezing, itching, ear ache, nasal congestion, post nasal drip,  Cardio-vascular:  No chest pain, Orthopnea, PND, anasarca, dizziness, palpitations.no Bilateral lower extremity swelling  GI:  No heartburn, indigestion, abdominal pain, nausea, vomiting, diarrhea, change in bowel habits, loss of appetite, melena, blood in stool, hematemesis Resp:  no shortness of breath at rest. No dyspnea on exertion, No excess mucus, no productive cough, No non-productive cough, No coughing up of blood.No change in color of mucus.No wheezing. Skin:  no rash or lesions. No jaundice GU:  no dysuria, change in color of urine, no urgency or frequency. No straining to urinate.  No flank pain.  Musculoskeletal:  No joint pain or no joint swelling. No decreased range of motion. No back pain.  Psych:  No change in mood or affect. No depression or anxiety. No memory loss.  Neuro: no localizing neurological complaints, no tingling, no weakness, no double vision, no gait abnormality, no slurred speech, no confusion  All systems reviewed and apart from [place] all are negative  Past Medical History:   Past Medical History:  Diagnosis Date   Cancer ([place])    breast - right   Hypertension    Pneumonia       Past Surgical History:  Procedure Laterality Date   ABDOMINAL HYSTERECTOMY  1977   HAND SURGERY  1978   TOE NAIL REMOVAL  2011    Social History:  Ambulatory  walker      reports that she has never smoked. She has never used smokeless tobacco. She reports current alcohol use. She reports that she does not use drugs.     Family History:   Family History  Problem Relation Age of Onset   Stroke Mother    Cancer Father      Allergies: Allergies  Allergen Reactions   Macrobid [Nitrofurantoin Macrocrystal]     Caused shortness of breath.     Prior to Admission medications   Medication Sig Start Date End Date Taking? Authorizing Provider  amLODipine (NORVASC) 2.5 MG tablet Take 2.5 mg by mouth daily.   Yes [provider]  calcium-vitamin D 250-100 MG-UNIT tablet Take 1 tablet by mouth 2 (two) times daily.   Yes [provider]  Cranberry 500 MG CAPS Take by mouth.   Yes [provider]  diphenhydrAMINE (BENADRYL) 25 MG tablet Take 25 mg by mouth at bedtime.   Yes [provider]  furosemide (LASIX) 40 MG tablet Take 40 mg by mouth Daily.  04/24/11  Yes [provider]  Glucosamine Sulfate 1000 MG CAPS Take 1 capsule by mouth daily.    Yes [provider]  naproxen sodium (ALEVE) 220 MG tablet Take 220 mg by mouth daily.   Yes [provider]  sulfamethoxazole-trimethoprim (BACTRIM DS) 800-160 MG tablet Take 1 tablet by mouth 2 (two) times daily.   Yes [provider]  diclofenac sodium (VOLTAREN) 1 % GEL Apply 2 g topically 4 (four) times daily. Rub into affected area of foot 2 to 4 times daily Patient not taking: Reported on 07/09/2018 11/29/14  Trula Slade, DPM   Physical Exam: Blood pressure (!) 122/58, pulse (!) 48, temperature 98.7 F (37.1 C), temperature source Oral, resp. rate 19, height 5' 3.5" (1.613 m), weight 71.2 kg, SpO2 93 %. 1. General:  in No  Acute distress   Chronically ill  -appearing 2. Psychological: Alert and  Oriented 3. Head/ENT:   Dry Mucous Membranes                          Head Non traumatic, neck supple                            Poor Dentition 4. SKIN:   decreased Skin turgor,  Skin clean  Wound noted on left leg well healing 5. Heart: Regular rate and rhythm no  Murmur, no Rub or gallop 6. Lungs:   no wheezes or crackles   7. Abdomen: Soft,    non-tender, Non distended bowel sounds  present 8. Lower extremities: no clubbing, cyanosis, no  edema 9. Neurologically Grossly intact, moving all 4 extremities equally  10. MSK: Normal range of motion   All other LABS:     Recent Labs  Lab 07/09/18 1852  WBC 8.5  NEUTROABS 6.0  HGB 11.6*  HCT 35.6*  MCV 105.6*  PLT 271     Recent Labs  Lab 07/09/18 1852  NA 137  K 4.5  CL 106  CO2 25  GLUCOSE 133*  BUN 35*  CREATININE 1.36*  CALCIUM 8.7*     No results for input(s): AST, ALT, ALKPHOS, BILITOT, PROT, ALBUMIN in the last 168 hours.     Cultures: No results found for: SDES, SPECREQUEST, CULT, REPTSTATUS   Radiological Exams on Admission: Ct Head Wo Contrast  Result Date: 07/09/2018 CLINICAL DATA:  85 year old female status post fall on anticoagulation. EXAM: CT HEAD WITHOUT CONTRAST CT CERVICAL SPINE WITHOUT CONTRAST TECHNIQUE: Multidetector CT imaging of the head and cervical spine was performed following the standard protocol without intravenous contrast. Multiplanar CT image reconstructions of the cervical spine were also generated. COMPARISON:  Brain MRI 06/15/2011. FINDINGS: CT HEAD FINDINGS Brain: Stable cerebral volume. Chronic cerebral white matter changes appear similar to the prior MRI. No midline shift, ventriculomegaly, mass effect, evidence of mass lesion, intracranial hemorrhage or evidence of cortically based acute infarction. No cortical encephalomalacia identified. Vascular: Calcified atherosclerosis at the skull base. No suspicious intracranial vascular hyperdensity. Skull: No fracture identified. Sinuses/Orbits: Visualized paranasal sinuses and mastoids are stable and well pneumatized. Other: No scalp hematoma. Postoperative changes to both globes since the prior MRI. CT CERVICAL SPINE FINDINGS Alignment: Mild reversal of cervical lordosis. Mild degenerative appearing anterolisthesis at C3-C4 and C4-C5. Cervicothoracic junction alignment is within normal limits. Bilateral posterior element  alignment is within normal limits. Skull base and vertebrae: Visualized skull base is intact. No atlanto-occipital dissociation. No acute osseous abnormality identified. Soft tissues and spinal canal: No prevertebral fluid or swelling. No visible canal hematoma. Mild calcified carotid atherosclerosis. Disc levels: Advanced left side facet arthropathy. Disc and endplate degeneration maximal at C5-C6. However, mild if any cervical spinal stenosis. Upper chest: Visible upper thoracic levels appear intact. Negative lung apices. IMPRESSION: 1. No acute traumatic injury identified in the head or cervical spine. 2. Negative for age non contrast CT appearance of the brain. 3. Cervical spine degeneration. Electronically Signed   By: Genevie Ann M.D.   On: 07/09/2018 22:44   Ct Cervical Spine  Wo Contrast  Result Date: 07/09/2018 CLINICAL DATA:  85 year old female status post fall on anticoagulation. EXAM: CT HEAD WITHOUT CONTRAST CT CERVICAL SPINE WITHOUT CONTRAST TECHNIQUE: Multidetector CT imaging of the head and cervical spine was performed following the standard protocol without intravenous contrast. Multiplanar CT image reconstructions of the cervical spine were also generated. COMPARISON:  Brain MRI 06/15/2011. FINDINGS: CT HEAD FINDINGS Brain: Stable cerebral volume. Chronic cerebral white matter changes appear similar to the prior MRI. No midline shift, ventriculomegaly, mass effect, evidence of mass lesion, intracranial hemorrhage or evidence of cortically based acute infarction. No cortical encephalomalacia identified. Vascular: Calcified atherosclerosis at the skull base. No suspicious intracranial vascular hyperdensity. Skull: No fracture identified. Sinuses/Orbits: Visualized paranasal sinuses and mastoids are stable and well pneumatized. Other: No scalp hematoma. Postoperative changes to both globes since the prior MRI. CT CERVICAL SPINE FINDINGS Alignment: Mild reversal of cervical lordosis. Mild degenerative  appearing anterolisthesis at C3-C4 and C4-C5. Cervicothoracic junction alignment is within normal limits. Bilateral posterior element alignment is within normal limits. Skull base and vertebrae: Visualized skull base is intact. No atlanto-occipital dissociation. No acute osseous abnormality identified. Soft tissues and spinal canal: No prevertebral fluid or swelling. No visible canal hematoma. Mild calcified carotid atherosclerosis. Disc levels: Advanced left side facet arthropathy. Disc and endplate degeneration maximal at C5-C6. However, mild if any cervical spinal stenosis. Upper chest: Visible upper thoracic levels appear intact. Negative lung apices. IMPRESSION: 1. No acute traumatic injury identified in the head or cervical spine. 2. Negative for age non contrast CT appearance of the brain. 3. Cervical spine degeneration. Electronically Signed   By: Genevie Ann M.D.   On: 07/09/2018 22:44   Dg Knee Right Port  Result Date: 07/09/2018 CLINICAL DATA:  Displaced intertrochanteric fracture EXAM: PORTABLE RIGHT KNEE - 1-2 VIEW COMPARISON:  None. FINDINGS: Moderate tricompartment degenerative changes with joint space narrowing and spurring, most pronounced in the medial compartment. No acute bony abnormality. Specifically, no fracture, subluxation, or dislocation. No joint effusion. IMPRESSION: Degenerative changes.  No acute bony abnormality. Electronically Signed   By: Rolm Baptise M.D.   On: 07/09/2018 21:44   Dg Hip Unilat With Pelvis 2-3 Views Right  Result Date: 07/09/2018 CLINICAL DATA:  85 year old female status post fall at home with pain. EXAM: DG HIP (WITH OR WITHOUT PELVIS) 2-3V RIGHT COMPARISON:  Pelvis radiograph 03/16/2015. FINDINGS: Comminuted intertrochanteric fracture of the right femur with varus impaction. Mild displacement of the lesser trochanter butterfly fragment. The right femoral head remains normally located. No fracture of the pelvis identified. Grossly intact proximal left femur.  Negative visible bowel gas pattern. Partially visible lumbar vertebral augmentation. IMPRESSION: Comminuted intertrochanteric fracture of the right femur with varus impaction. Electronically Signed   By: Genevie Ann M.D.   On: 07/09/2018 19:58    Chart has been reviewed    Assessment/Plan   85 y.o. female with medical history significant of HTN, Breast caner   L2 compression fracture old,   Admitted for right intertrochanteric hip fracture  Present on Admission:  Closed nondisplaced fracture of fourth metatarsal bone of left foot -  - management as per orthopedics,  plan to operate   in  a.m.   Keep nothing by mouth post midnight. [place]  not on anticoagulation   Ordered type and screen, Place Foley, order a vitamin D level  Patient at baseline  unable to walk a flight of stairs or 100 feet   due to  generalize fatigue and deconditioning  Patient denies any chest pain or shortness of breath currently and/or with exertion,    ECG showing no evidence of acute ischemia  no known history of coronary artery disease,  COPD  Liver failure have developed new diagnosis of CKD  Given advanced age patient is at least moderate  ris  which has been discussed with patient but at this point no furthther cardiac workup is indicated.       Essential hypertension -given elevated creatinine hold off on Lasix obtain urine electrolytes and monitor give gentle fluids restart Lasix as needed  AKI (acute kidney injury) ([place]) unsure if true AKI versus CKD given last creatinine was in 2014 obtain urine electrolytes and continue to monitor avoid   nephrotoxic medications  left leg wound well healing, order wound consult    Other plan as per orders.  DVT prophylaxis:  SCD     Code Status:    DNR/DNI  as per patient    I had personally discussed CODE STATUS with patient   Family Communication:   Family not at  Bedside    Disposition Plan:     likely will need placement for rehabilitation                                                                 Social Work  consulted                                       Consults called: Orthopedics    Admission status:  ED Disposition    ED Disposition Condition [place]: [place] [[id number]]  Level of Care: Med-Surg [16]  Covid Evaluation: Confirmed COVID Negative  Diagnosis: Closed right hip fracture [facility]) [[id number]]  Admitting Physician: Toy Baker [[id number]]  Attending Physician: Toy Baker [[id number]]  Estimated length of stay: 3 - 4 days  Certification:: I certify this patient will need inpatient services for at least 2 midnights  PT Class (Do Not Modify): Inpatient [101]  PT Acc Code (Do Not Modify): Private [1]          inpatient     Expect 2 midnight stay secondary to severity of patient's current illness including     Severe lab/radiological/exam abnormalities including: right Hip fracture   and extensive comorbidities including:  malignancy,     That are currently affecting medical management.   I expect  patient to be hospitalized for 2 midnights requiring inpatient medical care.  Patient is at high risk for adverse outcome (such as loss of life or disability) if not treated.  Indication for inpatient stay as follows:    severe pain requiring acute inpatient management,  inability to maintain oral hydration    Need for operative/procedural  intervention    Need for  IV fluids, IV pain medications       Level of care     medical floor       Precautions:  NONE  No active isolations  PPE: Used by the provider:   P100  eye Goggles,  Gloves       Ercilia Bettinger 07/10/2018, 12:10 AM    [facility]  after 2 AM please page floor coverage PA If 7AM-7PM, please contact the day team taking care of the patient using [email or web address]

## 2018-07-09 NOTE — ED Notes (Signed)
Bed: RESB Expected date:  Expected time:  Means of arrival:  Comments: EMS/hip pain

## 2018-07-10 ENCOUNTER — Other Ambulatory Visit: Payer: Self-pay

## 2018-07-10 ENCOUNTER — Inpatient Hospital Stay (HOSPITAL_COMMUNITY): Payer: Medicare HMO | Admitting: Certified Registered Nurse Anesthetist

## 2018-07-10 ENCOUNTER — Inpatient Hospital Stay (HOSPITAL_COMMUNITY): Payer: Medicare HMO

## 2018-07-10 ENCOUNTER — Encounter (HOSPITAL_COMMUNITY): Payer: Self-pay | Admitting: Certified Registered Nurse Anesthetist

## 2018-07-10 ENCOUNTER — Encounter (HOSPITAL_COMMUNITY)
Admission: EM | Disposition: A | Discharge status: Skilled Nursing Facility | Payer: Self-pay | Source: Home / Self Care | Attending: Family Medicine

## 2018-07-10 DIAGNOSIS — S72141A Displaced intertrochanteric fracture of right femur, initial encounter for closed fracture: Secondary | ICD-10-CM

## 2018-07-10 HISTORY — PX: INTRAMEDULLARY (IM) NAIL INTERTROCHANTERIC: SHX5875

## 2018-07-10 LAB — CBC
HCT: 33.1 % — ABNORMAL LOW (ref 36.0–46.0)
Hemoglobin: 10.7 g/dL — ABNORMAL LOW (ref 12.0–15.0)
MCH: 34.3 pg — ABNORMAL HIGH (ref 26.0–34.0)
MCHC: 32.3 g/dL (ref 30.0–36.0)
MCV: 106.1 fL — ABNORMAL HIGH (ref 80.0–100.0)
Platelets: 224 10*3/uL (ref 150–400)
RBC: 3.12 MIL/uL — ABNORMAL LOW (ref 3.87–5.11)
RDW: 13.4 % (ref 11.5–15.5)
WBC: 11.6 10*3/uL — ABNORMAL HIGH (ref 4.0–10.5)
nRBC: 0 % (ref 0.0–0.2)

## 2018-07-10 LAB — BASIC METABOLIC PANEL
Anion gap: 8 (ref 5–15)
BUN: 28 mg/dL — ABNORMAL HIGH (ref 8–23)
CO2: 21 mmol/L — ABNORMAL LOW (ref 22–32)
Calcium: 8.4 mg/dL — ABNORMAL LOW (ref 8.9–10.3)
Chloride: 109 mmol/L (ref 98–111)
Creatinine, Ser: 1.12 mg/dL — ABNORMAL HIGH (ref 0.44–1.00)
GFR calc Af Amer: 49 mL/min — ABNORMAL LOW (ref >60)
GFR calc non Af Amer: 42 mL/min — ABNORMAL LOW (ref >60)
Glucose, Bld: 227 mg/dL — ABNORMAL HIGH (ref 70–99)
Potassium: 4.7 mmol/L (ref 3.5–5.1)
Sodium: 138 mmol/L (ref 135–145)

## 2018-07-10 LAB — CREATININE, URINE, RANDOM: Creatinine, Urine: 86.96 mg/dL

## 2018-07-10 LAB — SODIUM, URINE, RANDOM: Sodium, Ur: 65 mmol/L

## 2018-07-10 LAB — MRSA PCR SCREENING: MRSA by PCR: NEGATIVE

## 2018-07-10 LAB — ALBUMIN: Albumin: 3.6 g/dL (ref 3.5–5.0)

## 2018-07-10 LAB — ABO/RH: ABO/RH(D): O NEG

## 2018-07-10 SURGERY — FIXATION, FRACTURE, INTERTROCHANTERIC, WITH INTRAMEDULLARY ROD
Anesthesia: Choice | Site: Hip | Laterality: Right

## 2018-07-10 MED ORDER — ROCURONIUM BROMIDE 10 MG/ML (PF) SYRINGE
PREFILLED_SYRINGE | INTRAVENOUS | Status: AC
  Filled 2018-07-10: qty 10

## 2018-07-10 MED ORDER — SODIUM CHLORIDE 0.9 % IV SOLN
INTRAVENOUS | Status: AC
  Administered 2018-07-10: 02:00:00 via INTRAVENOUS

## 2018-07-10 MED ORDER — ONDANSETRON HCL 4 MG/2ML IJ SOLN
INTRAMUSCULAR | Status: AC
  Filled 2018-07-10: qty 2

## 2018-07-10 MED ORDER — SODIUM CHLORIDE 0.9 % IV SOLN
INTRAVENOUS | Status: DC | PRN
  Administered 2018-07-10: 50 ug/min via INTRAVENOUS

## 2018-07-10 MED ORDER — SUGAMMADEX SODIUM 200 MG/2ML IV SOLN
INTRAVENOUS | Status: DC | PRN
  Administered 2018-07-10: 200 mg via INTRAVENOUS

## 2018-07-10 MED ORDER — ONDANSETRON HCL 4 MG PO TABS: 4.0000 mg | ORAL_TABLET | Freq: Four times a day (QID) | ORAL | Status: DC | PRN

## 2018-07-10 MED ORDER — STERILE WATER FOR IRRIGATION IR SOLN
Status: DC | PRN
  Administered 2018-07-10: 2000 mL

## 2018-07-10 MED ORDER — 0.9 % SODIUM CHLORIDE (POUR BTL) OPTIME
TOPICAL | Status: DC | PRN
  Administered 2018-07-10: 1000 mL

## 2018-07-10 MED ORDER — PROMETHAZINE HCL 25 MG/ML IJ SOLN: 6.2500 mg | INTRAMUSCULAR | Status: DC | PRN

## 2018-07-10 MED ORDER — POVIDONE-IODINE 10 % EX SWAB
2.0000 "application " | Freq: Once | CUTANEOUS | Status: AC
  Administered 2018-07-10: 2 via TOPICAL

## 2018-07-10 MED ORDER — DEXAMETHASONE SODIUM PHOSPHATE 10 MG/ML IJ SOLN
INTRAMUSCULAR | Status: DC | PRN
  Administered 2018-07-10: 4 mg via INTRAVENOUS

## 2018-07-10 MED ORDER — FENTANYL CITRATE (PF) 100 MCG/2ML IJ SOLN: 25.0000 ug | INTRAMUSCULAR | Status: DC | PRN

## 2018-07-10 MED ORDER — LIDOCAINE 2% (20 MG/ML) 5 ML SYRINGE
INTRAMUSCULAR | Status: AC
  Filled 2018-07-10: qty 5

## 2018-07-10 MED ORDER — MORPHINE SULFATE (PF) 2 MG/ML IV SOLN: 0.5000 mg | INTRAVENOUS | Status: DC | PRN

## 2018-07-10 MED ORDER — DEXAMETHASONE SODIUM PHOSPHATE 10 MG/ML IJ SOLN
INTRAMUSCULAR | Status: AC
  Filled 2018-07-10: qty 1

## 2018-07-10 MED ORDER — MEPERIDINE HCL 50 MG/ML IJ SOLN: 6.2500 mg | INTRAMUSCULAR | Status: DC | PRN

## 2018-07-10 MED ORDER — ENOXAPARIN SODIUM 40 MG/0.4ML ~~LOC~~ SOLN
40.0000 mg | SUBCUTANEOUS | Status: DC
  Administered 2018-07-11 – 2018-07-15 (×5): 40 mg via SUBCUTANEOUS
  Filled 2018-07-10 (×5): qty 0.4

## 2018-07-10 MED ORDER — LACTATED RINGERS IV SOLN
INTRAVENOUS | Status: DC | PRN
  Administered 2018-07-10: 13:00:00 via INTRAVENOUS

## 2018-07-10 MED ORDER — SUCCINYLCHOLINE CHLORIDE 200 MG/10ML IV SOSY
PREFILLED_SYRINGE | INTRAVENOUS | Status: AC
  Filled 2018-07-10: qty 10

## 2018-07-10 MED ORDER — ONDANSETRON HCL 4 MG/2ML IJ SOLN: 4.0000 mg | Freq: Four times a day (QID) | INTRAMUSCULAR | Status: DC | PRN

## 2018-07-10 MED ORDER — PROPOFOL 10 MG/ML IV BOLUS
INTRAVENOUS | Status: DC | PRN
  Administered 2018-07-10: 100 mg via INTRAVENOUS

## 2018-07-10 MED ORDER — ISOPROPYL ALCOHOL 70 % SOLN
Status: DC | PRN
  Administered 2018-07-10: 1 via TOPICAL

## 2018-07-10 MED ORDER — FENTANYL CITRATE (PF) 250 MCG/5ML IJ SOLN
INTRAMUSCULAR | Status: AC
  Filled 2018-07-10: qty 5

## 2018-07-10 MED ORDER — OXYCODONE HCL 5 MG PO TABS
2.5000 mg | ORAL_TABLET | ORAL | Status: DC | PRN
  Administered 2018-07-10 – 2018-07-13 (×3): 2.5 mg via ORAL
  Filled 2018-07-10 (×4): qty 1

## 2018-07-10 MED ORDER — CEFAZOLIN SODIUM-DEXTROSE 2-4 GM/100ML-% IV SOLN
2.0000 g | Freq: Four times a day (QID) | INTRAVENOUS | Status: AC
  Administered 2018-07-10 – 2018-07-11 (×2): 2 g via INTRAVENOUS
  Filled 2018-07-10 (×2): qty 100

## 2018-07-10 MED ORDER — OXYCODONE HCL 5 MG PO TABS
5.0000 mg | ORAL_TABLET | ORAL | Status: DC | PRN
  Administered 2018-07-13 – 2018-07-15 (×8): 5 mg via ORAL
  Filled 2018-07-10 (×9): qty 1

## 2018-07-10 MED ORDER — TRANEXAMIC ACID-NACL 1000-0.7 MG/100ML-% IV SOLN
1000.0000 mg | INTRAVENOUS | Status: AC
  Administered 2018-07-10: 1000 mg via INTRAVENOUS
  Filled 2018-07-10: qty 100

## 2018-07-10 MED ORDER — ONDANSETRON HCL 4 MG/2ML IJ SOLN
INTRAMUSCULAR | Status: DC | PRN
  Administered 2018-07-10: 4 mg via INTRAVENOUS

## 2018-07-10 MED ORDER — AMLODIPINE BESYLATE 5 MG PO TABS: 2.5000 mg | ORAL_TABLET | Freq: Every day | ORAL | Status: DC

## 2018-07-10 MED ORDER — FENTANYL CITRATE (PF) 250 MCG/5ML IJ SOLN
INTRAMUSCULAR | Status: DC | PRN
  Administered 2018-07-10: 100 ug via INTRAVENOUS
  Administered 2018-07-10 (×3): 50 ug via INTRAVENOUS

## 2018-07-10 MED ORDER — ACETAMINOPHEN 500 MG PO TABS: 1000.0000 mg | ORAL_TABLET | Freq: Three times a day (TID) | ORAL | Status: DC

## 2018-07-10 MED ORDER — METHOCARBAMOL 500 MG PO TABS: 500.0000 mg | ORAL_TABLET | Freq: Four times a day (QID) | ORAL | Status: DC | PRN

## 2018-07-10 MED ORDER — ROCURONIUM BROMIDE 10 MG/ML (PF) SYRINGE
PREFILLED_SYRINGE | INTRAVENOUS | Status: DC | PRN
  Administered 2018-07-10: 40 mg via INTRAVENOUS

## 2018-07-10 MED ORDER — METOCLOPRAMIDE HCL 5 MG/ML IJ SOLN: 5.0000 mg | Freq: Three times a day (TID) | INTRAMUSCULAR | Status: DC | PRN

## 2018-07-10 MED ORDER — SODIUM CHLORIDE 0.9 % IV SOLN
INTRAVENOUS | Status: AC
  Administered 2018-07-10: 17:00:00 via INTRAVENOUS

## 2018-07-10 MED ORDER — CEFAZOLIN SODIUM-DEXTROSE 2-4 GM/100ML-% IV SOLN
2.0000 g | INTRAVENOUS | Status: AC
  Administered 2018-07-10: 2 g via INTRAVENOUS
  Filled 2018-07-10: qty 100

## 2018-07-10 MED ORDER — MENTHOL 3 MG MT LOZG: 1.0000 | LOZENGE | OROMUCOSAL | Status: DC | PRN

## 2018-07-10 MED ORDER — METOCLOPRAMIDE HCL 5 MG PO TABS: 5.0000 mg | ORAL_TABLET | Freq: Three times a day (TID) | ORAL | Status: DC | PRN

## 2018-07-10 MED ORDER — HYDROCODONE-ACETAMINOPHEN 5-325 MG PO TABS: 1.0000 | ORAL_TABLET | Freq: Four times a day (QID) | ORAL | Status: DC | PRN

## 2018-07-10 MED ORDER — METHOCARBAMOL 1000 MG/10ML IJ SOLN
500.0000 mg | Freq: Four times a day (QID) | INTRAVENOUS | Status: DC | PRN
  Filled 2018-07-10: qty 5

## 2018-07-10 MED ORDER — ENSURE PRE-SURGERY PO LIQD
296.0000 mL | Freq: Once | ORAL | Status: AC
  Administered 2018-07-10: 02:00:00 296 mL via ORAL
  Filled 2018-07-10: qty 296

## 2018-07-10 MED ORDER — SENNA 8.6 MG PO TABS
1.0000 | ORAL_TABLET | Freq: Two times a day (BID) | ORAL | Status: DC
  Administered 2018-07-10 – 2018-07-15 (×10): 8.6 mg via ORAL
  Filled 2018-07-10 (×10): qty 1

## 2018-07-10 MED ORDER — CHLORHEXIDINE GLUCONATE 4 % EX LIQD
60.0000 mL | Freq: Once | CUTANEOUS | Status: AC
  Administered 2018-07-10: 4 via TOPICAL
  Filled 2018-07-10: qty 60

## 2018-07-10 MED ORDER — POVIDONE-IODINE 10 % EX SWAB: 2.0000 "application " | Freq: Once | CUTANEOUS | Status: DC

## 2018-07-10 MED ORDER — PHENOL 1.4 % MT LIQD: 1.0000 | OROMUCOSAL | Status: DC | PRN

## 2018-07-10 MED ORDER — SUCCINYLCHOLINE CHLORIDE 200 MG/10ML IV SOSY
PREFILLED_SYRINGE | INTRAVENOUS | Status: DC | PRN
  Administered 2018-07-10: 100 mg via INTRAVENOUS

## 2018-07-10 MED ORDER — DOCUSATE SODIUM 100 MG PO CAPS
100.0000 mg | ORAL_CAPSULE | Freq: Two times a day (BID) | ORAL | Status: DC
  Administered 2018-07-10 – 2018-07-15 (×10): 100 mg via ORAL
  Filled 2018-07-10 (×10): qty 1

## 2018-07-10 MED ORDER — LIDOCAINE 2% (20 MG/ML) 5 ML SYRINGE
INTRAMUSCULAR | Status: DC | PRN
  Administered 2018-07-10: 80 mg via INTRAVENOUS

## 2018-07-10 SURGICAL SUPPLY — 40 items
BAG ZIPLOCK 12X15 (MISCELLANEOUS) IMPLANT
BIT DRILL CANN LG 4.3MM (BIT) IMPLANT
CHLORAPREP W/TINT 26 (MISCELLANEOUS) ×3 IMPLANT
COVER PERINEAL POST (MISCELLANEOUS) ×3 IMPLANT
COVER SURGICAL LIGHT HANDLE (MISCELLANEOUS) ×3 IMPLANT
COVER WAND RF STERILE (DRAPES) IMPLANT
DERMABOND ADVANCED (GAUZE/BANDAGES/DRESSINGS) ×4
DERMABOND ADVANCED .7 DNX12 (GAUZE/BANDAGES/DRESSINGS) ×2 IMPLANT
DRAPE C-ARM 42X120 X-RAY (DRAPES) ×3 IMPLANT
DRAPE C-ARMOR (DRAPES) ×3 IMPLANT
DRAPE IMP U-DRAPE 54X76 (DRAPES) ×6 IMPLANT
DRAPE SHEET LG 3/4 BI-LAMINATE (DRAPES) ×6 IMPLANT
DRAPE STERI IOBAN 125X83 (DRAPES) ×3 IMPLANT
DRAPE U-SHAPE 47X51 STRL (DRAPES) ×6 IMPLANT
DRILL BIT CANN LG 4.3MM (BIT) ×3
DRSG AQUACEL AG ADV 3.5X 4 (GAUZE/BANDAGES/DRESSINGS) ×2 IMPLANT
DRSG AQUACEL AG ADV 3.5X 6 (GAUZE/BANDAGES/DRESSINGS) ×2 IMPLANT
DRSG MEPILEX BORDER 4X4 (GAUZE/BANDAGES/DRESSINGS) ×6 IMPLANT
DRSG MEPILEX BORDER 4X8 (GAUZE/BANDAGES/DRESSINGS) IMPLANT
ELECT BLADE TIP CTD 4 INCH (ELECTRODE) IMPLANT
FACESHIELD WRAPAROUND (MASK) ×6 IMPLANT
FACESHIELD WRAPAROUND OR TEAM (MASK) ×2 IMPLANT
GAUZE SPONGE 4X4 12PLY STRL (GAUZE/BANDAGES/DRESSINGS) ×3 IMPLANT
GLOVE BIO SURGEON STRL SZ8.5 (GLOVE) ×6 IMPLANT
GLOVE BIOGEL PI IND STRL 8.5 (GLOVE) ×1 IMPLANT
GLOVE BIOGEL PI INDICATOR 8.5 (GLOVE) ×2
GOWN SPEC L3 XXLG W/TWL (GOWN DISPOSABLE) ×3 IMPLANT
GUIDEPIN 3.2X17.5 THRD DISP (PIN) ×2 IMPLANT
HFN 125 DEG 9MM X 180MM (Nail) ×2 IMPLANT
KIT BASIN OR (CUSTOM PROCEDURE TRAY) ×3 IMPLANT
KIT TURNOVER KIT A (KITS) IMPLANT
MANIFOLD NEPTUNE II (INSTRUMENTS) ×3 IMPLANT
MARKER SKIN DUAL TIP RULER LAB (MISCELLANEOUS) ×3 IMPLANT
PACK GENERAL/GYN (CUSTOM PROCEDURE TRAY) ×3 IMPLANT
SCREW BONE CORTICAL 5.0X3 (Screw) ×2 IMPLANT
SCREW LAG HIP NAIL 10.5X95 (Screw) ×2 IMPLANT
SUT MNCRL AB 3-0 PS2 18 (SUTURE) ×3 IMPLANT
SUT MON AB 2-0 CT1 36 (SUTURE) ×2 IMPLANT
SUT VIC AB 1 CT1 36 (SUTURE) ×3 IMPLANT
TOWEL OR 17X26 10 PK STRL BLUE (TOWEL DISPOSABLE) ×3 IMPLANT

## 2018-07-10 NOTE — Anesthesia Procedure Notes (Signed)
Procedure Name: Intubation Date/Time: 07/10/2018 1:14 PM Performed by: Mitzie Na, CRNA Pre-anesthesia Checklist: Patient identified, Emergency Drugs available, Suction available and Patient being monitored Patient Re-evaluated:Patient Re-evaluated prior to induction Oxygen Delivery Method: Circle system utilized Preoxygenation: Pre-oxygenation with 100% oxygen Induction Type: IV induction and Rapid sequence Laryngoscope Size: Mac and 3 Grade View: Grade I Tube type: Oral Tube size: 7.0 mm Number of attempts: 1 Airway Equipment and Method: Stylet and Oral airway Placement Confirmation: ETT inserted through vocal cords under direct vision,  positive ETCO2 and breath sounds checked- equal and bilateral Secured at: 23 cm Tube secured with: Tape Dental Injury: Teeth and Oropharynx as per pre-operative assessment

## 2018-07-10 NOTE — Anesthesia Preprocedure Evaluation (Addendum)
Anesthesia Evaluation  Patient identified by MRN, date of birth, ID band Patient awake    Reviewed: Allergy & Precautions, NPO status , Patient's Chart, lab work & pertinent test results  Airway Mallampati: II  TM Distance: >3 FB Neck ROM: Full    Dental  (+) Dental Advisory Given   Pulmonary neg pulmonary ROS,    Pulmonary exam normal breath sounds clear to auscultation       Cardiovascular hypertension, Pt. on medications negative cardio ROS Normal cardiovascular exam Rhythm:Regular Rate:Normal     Neuro/Psych negative neurological ROS  negative psych ROS   GI/Hepatic negative GI ROS, Neg liver ROS,   Endo/Other  negative endocrine ROS  Renal/GU Renal disease     Musculoskeletal negative musculoskeletal ROS (+)   Abdominal   Peds  Hematology negative hematology ROS (+)   Anesthesia Other Findings   Reproductive/Obstetrics                             Anesthesia Physical Anesthesia Plan  ASA: III  Anesthesia Plan: General   Post-op Pain Management:    Induction: Intravenous  PONV Risk Score and Plan: 4 or greater and Ondansetron, Dexamethasone and Treatment may vary due to age or medical condition  Airway Management Planned: Oral ETT  Additional Equipment: None  Intra-op Plan:   Post-operative Plan:   Informed Consent: I have reviewed the patients History and Physical, chart, labs and discussed the procedure including the risks, benefits and alternatives for the proposed anesthesia with the patient or authorized representative who has indicated his/her understanding and acceptance.   Patient has DNR.  Discussed DNR with patient and Suspend DNR.   Dental advisory given  Plan Discussed with: CRNA  Anesthesia Plan Comments: (Discussed limited suspension of DNR for procedure. )       Anesthesia Quick Evaluation

## 2018-07-10 NOTE — Op Note (Signed)
OPERATIVE REPORT  SURGEON: Rod Can, MD   ASSISTANT: Staff.  PREOPERATIVE DIAGNOSIS: Right intertrochanteric femur fracture.   POSTOPERATIVE DIAGNOSIS: Right intertrochanteric femur fracture.   PROCEDURE: Intramedullary fixation, Right femur.   IMPLANTS: Biomet Affixus Hip Fracture Nail, 9 by 180 mm, 125 degrees. 10.5 x 95 mm Hip Fracture Nail Lag Screw. 5 x 34 mm distal interlocking screw 1.  ANESTHESIA:  General  ESTIMATED BLOOD LOSS:-50 mL    ANTIBIOTICS: 2 g Ancef.  DRAINS: None.  COMPLICATIONS: None.   CONDITION: PACU - hemodynamically stable.   BRIEF CLINICAL NOTE: [name] is a 85 y.o. female who presented with an intertrochanteric femur fracture. The patient was admitted to the hospitalist service and underwent perioperative risk stratification and medical optimization. The risks, benefits, and alternatives to the procedure were explained, and the patient elected to proceed.  PROCEDURE IN DETAIL: Surgical site was marked by myself. The patient was taken to the operating room and anesthesia was induced on the bed. The patient was then transferred to the [facility] table and the nonoperative lower extremity was scissored underneath the operative side. The fracture was reduced with traction, internal rotation, and adduction. The hip was prepped and draped in the normal sterile surgical fashion. Timeout was called verifying side and site of surgery. Preop antibiotics were given with 60 minutes of beginning the procedure.  Fluoroscopy was used to define the patient's anatomy. A 4 cm incision was made just proximal to the tip of the greater trochanter. The awl was used to obtain the standard starting point for a trochanteric entry nail under fluoroscopic control. The guidepin was placed. The entry reamer was used to open the proximal femur.  On the back table, the nail was assembled onto the jig. The nail was placed into the femur without any difficulty. Through a  separate stab incision, the cannula was placed down to the bone in preparation for the cephalomedullary device. A guidepin was placed into the femoral head using AP and lateral fluoroscopy views. The pin was measured, and then reaming was performed to the appropriate depth. The lag screw was inserted to the appropriate depth. The fracture was compressed through the jig. The setscrew was tightened and then loosened one quarter turn. A separate stab incision was created, and the distal interlocking screw was placed using standard AO technique. The jig was removed. Final AP and lateral fluoroscopy views were obtained to confirm fracture reduction and hardware placement. Tip apex distance was appropriate. There was no chondral penetration.  The wounds were copiously irrigated with saline. The wound was closed in layers with #1 Vicryl for the fascia, 2-0 Monocryl for the deep dermal layer, and 3-0 Monocryl subcuticular stitch. Glue was applied to the skin. Once the glue was fully hardened, sterile dressing was applied. The patient was then awakened from anesthesia and taken to the PACU in stable condition. Sponge needle and instrument counts were correct at the end of the case 2. There were no known complications.  We will readmit the patient to the hospitalist. Weightbearing status will be weightbearing as tolerated with a walker. We will begin Lovenox for DVT prophylaxis. The patient will work with physical therapy and undergo disposition planning.

## 2018-07-10 NOTE — Progress Notes (Addendum)
PROGRESS NOTE    [name]  RDE:[id number] DOB: 07-Mar-1922 DOA: 07/09/2018 PCP: Rachael Manes, MD   Brief Narrative:  85 yo with hx of HTN, breast cancer who presented after [name] mechanical fall with R femur fracture.   Assessment & Plan:   Active Problems:   History of breast cancer   Closed nondisplaced fracture of fourth metatarsal bone of left foot   Essential hypertension   AKI (acute kidney injury) ([place])   Closed right hip fracture (HCC)   Right hip fracture   Mechanical Fall:  Imaging with intertrochanteric fracture of the R femur with varus impaction Knee plain films negative Head and c spine CT without acute findings Plan for OR today with Dr. Lyla Glassing Follow vitamin D level  HTN: holding lasix, continue amlodipine  AKI: baseline creatinine appears to be ~1, creatinine was 1.36 at presentation, improved today, continue to monitor, hold lasix.   Pyuria: follow urine culture  LLE wound: wound care c/s, appreciate recommendations.    DVT prophylaxis: SCD Code Status: DNR Family Communication: none at bedside - discussed with husband Disposition Plan: pending OR and clearance by ortho   Consultants:   orthopedics  Procedures:   none  Antimicrobials:  Anti-infectives (From admission, onward)   Start     Dose/Rate Route Frequency Ordered Stop   07/10/18 0600  ceFAZolin (ANCEF) IVPB 2g/100 mL premix     2 g 200 mL/hr over 30 Minutes Intravenous On call to O.R. 07/10/18 0125 07/11/18 0559     Subjective: [name] after leaning on chair, just fell back.  No CP, LH, dizziness, or LOC.  Objective: Vitals:   07/09/18 2200 07/10/18 0027 07/10/18 0124 07/10/18 0520  BP: (!) 122/58 (!) 138/54 (!) 157/83 (!) 147/66  Pulse: (!) 48 81 88 87  Resp: 19 16 17 15   Temp:   97.9 F (36.6 C) 97.8 F (36.6 C)  TempSrc:   Oral Oral  SpO2: 93% 93% 93% 98%  Weight:   71.4 kg   Height:   5\' 3"  (1.6 m)     Intake/Output Summary (Last 24 hours) at 07/10/2018  1156 Last data filed at 07/10/2018 1025 Gross per 24 hour  Intake 898.07 ml  Output 450 ml  Net 448.07 ml   Filed Weights   07/09/18 1808 07/10/18 0124  Weight: 71.2 kg 71.4 kg    Examination:  General exam: Appears calm and comfortable  Respiratory system: Clear to auscultation. Respiratory effort normal. Cardiovascular system: S1 & S2 heard, RRR.  Gastrointestinal system: Abdomen is nondistended, soft and nontender. Central nervous system: Alert and oriented. No focal neurological deficits. Extremities: RLE with ttp at hip  Skin: No rashes, lesions or ulcers Psychiatry: Judgement and insight appear normal. Mood & affect appropriate.     Data Reviewed: I have personally reviewed following labs and imaging studies  CBC: Recent Labs  Lab 07/09/18 1852 07/10/18 0412  WBC 8.5 11.6*  NEUTROABS 6.0  --   HGB 11.6* 10.7*  HCT 35.6* 33.1*  MCV 105.6* 106.1*  PLT 271 631   Basic Metabolic Panel: Recent Labs  Lab 07/09/18 1852 07/10/18 0412  NA 137 138  K 4.5 4.7  CL 106 109  CO2 25 21*  GLUCOSE 133* 227*  BUN 35* 28*  CREATININE 1.36* 1.12*  CALCIUM 8.7* 8.4*   GFR: Estimated Creatinine Clearance: 29.1 mL/min ([name]) (by C-G formula based on SCr of 1.12 mg/dL (H)). Liver Function Tests: Recent Labs  Lab 07/10/18 0412  ALBUMIN 3.6  No results for input(s): LIPASE, AMYLASE in the last 168 hours. No results for input(s): AMMONIA in the last 168 hours. Coagulation Profile: Recent Labs  Lab 07/09/18 1852  INR 1.3*   Cardiac Enzymes: No results for input(s): CKTOTAL, CKMB, CKMBINDEX, TROPONINI in the last 168 hours. BNP (last 3 results) No results for input(s): PROBNP in the last 8760 hours. HbA1C: No results for input(s): HGBA1C in the last 72 hours. CBG: No results for input(s): GLUCAP in the last 168 hours. Lipid Profile: No results for input(s): CHOL, HDL, LDLCALC, TRIG, CHOLHDL, LDLDIRECT in the last 72 hours. Thyroid Function Tests: No results for  input(s): TSH, T4TOTAL, FREET4, T3FREE, THYROIDAB in the last 72 hours. Anemia Panel: No results for input(s): VITAMINB12, FOLATE, FERRITIN, TIBC, IRON, RETICCTPCT in the last 72 hours. Sepsis Labs: No results for input(s): PROCALCITON, LATICACIDVEN in the last 168 hours.  Recent Results (from the past 240 hour(s))  SARS Coronavirus 2 (CEPHEID - Performed in [name] hospital lab), Hosp Order     Status: None   Collection Time: 07/09/18  8:10 PM   Specimen: Nasopharyngeal Swab  Result Value Ref Range Status   SARS Coronavirus 2 NEGATIVE NEGATIVE Final    Comment: (NOTE) If result is NEGATIVE SARS-CoV-2 target nucleic acids are NOT DETECTED. The SARS-CoV-2 RNA is generally detectable in upper and lower  respiratory specimens during the acute phase of infection. The lowest  concentration of SARS-CoV-2 viral copies this assay can detect is 250  copies / mL. [name] negative result does not preclude SARS-CoV-2 infection  and should not be used as the sole basis for treatment or other  patient management decisions.  [name] negative result may occur with  improper specimen collection / handling, submission of specimen other  than nasopharyngeal swab, presence of viral mutation(s) within the  areas targeted by this assay, and inadequate number of viral copies  (<250 copies / mL). [name] negative result must be combined with clinical  observations, patient history, and epidemiological information. If result is POSITIVE SARS-CoV-2 target nucleic acids are DETECTED. The SARS-CoV-2 RNA is generally detectable in upper and lower  respiratory specimens dur ing the acute phase of infection.  Positive  results are indicative of active infection with SARS-CoV-2.  Clinical  correlation with patient history and other diagnostic information is  necessary to determine patient infection status.  Positive results do  not rule out bacterial infection or co-infection with other viruses. If result is PRESUMPTIVE  POSTIVE SARS-CoV-2 nucleic acids MAY BE PRESENT.   [name] presumptive positive result was obtained on the submitted specimen  and confirmed on repeat testing.  While 2019 novel coronavirus  (SARS-CoV-2) nucleic acids may be present in the submitted sample  additional confirmatory testing may be necessary for epidemiological  and / or clinical management purposes  to differentiate between  SARS-CoV-2 and other Sarbecovirus currently known to infect humans.  If clinically indicated additional testing with an alternate test  methodology [phone]) is advised. The SARS-CoV-2 RNA is generally  detectable in upper and lower respiratory sp ecimens during the acute  phase of infection. The expected result is Negative. Fact Sheet for Patients:  StrictlyIdeas.no Fact Sheet for Healthcare Providers: BankingDealers.co.za This test is not yet approved or cleared by the [place] FDA and has been authorized for detection and/or diagnosis of SARS-CoV-2 by FDA under an Emergency Use Authorization (EUA).  This EUA will remain in effect (meaning this test can be used) for the duration of the COVID-19 declaration under Section  564(b)(1) of the Act, 21 U.S.C. section 360bbb-3(b)(1), unless the authorization is terminated or revoked sooner. Performed at [facility], [place] [address] [place], [place] [address]   MRSA PCR Screening     Status: None   Collection Time: 07/10/18  1:56 AM   Specimen: Nasal Mucosa; Nasopharyngeal  Result Value Ref Range Status   MRSA by PCR NEGATIVE NEGATIVE Final    Comment:        The GeneXpert MRSA Assay (FDA approved for NASAL specimens only), is one component of Tannah Dreyfuss comprehensive MRSA colonization surveillance program. It is not intended to diagnose MRSA infection nor to guide or monitor treatment for MRSA infections. Performed at [facility], [place] [address] [place], [place]  [address]          Radiology Studies: Ct Head Wo Contrast  Result Date: 07/09/2018 CLINICAL DATA:  85 year old female status post fall on anticoagulation. EXAM: CT HEAD WITHOUT CONTRAST CT CERVICAL SPINE WITHOUT CONTRAST TECHNIQUE: Multidetector CT imaging of the head and cervical spine was performed following the standard protocol without intravenous contrast. Multiplanar CT image reconstructions of the cervical spine were also generated. COMPARISON:  Brain MRI 06/15/2011. FINDINGS: CT HEAD FINDINGS Brain: Stable cerebral volume. Chronic cerebral white matter changes appear similar to the prior MRI. No midline shift, ventriculomegaly, mass effect, evidence of mass lesion, intracranial hemorrhage or evidence of cortically based acute infarction. No cortical encephalomalacia identified. Vascular: Calcified atherosclerosis at the skull base. No suspicious intracranial vascular hyperdensity. Skull: No fracture identified. Sinuses/Orbits: Visualized paranasal sinuses and mastoids are stable and well pneumatized. Other: No scalp hematoma. Postoperative changes to both globes since the prior MRI. CT CERVICAL SPINE FINDINGS Alignment: Mild reversal of cervical lordosis. Mild degenerative appearing anterolisthesis at C3-C4 and C4-C5. Cervicothoracic junction alignment is within normal limits. Bilateral posterior element alignment is within normal limits. Skull base and vertebrae: Visualized skull base is intact. No atlanto-occipital dissociation. No acute osseous abnormality identified. Soft tissues and spinal canal: No prevertebral fluid or swelling. No visible canal hematoma. Mild calcified carotid atherosclerosis. Disc levels: Advanced left side facet arthropathy. Disc and endplate degeneration maximal at C5-C6. However, mild if any cervical spinal stenosis. Upper chest: Visible upper thoracic levels appear intact. Negative lung apices. IMPRESSION: 1. No acute traumatic injury identified in the head or cervical  spine. 2. Negative for age non contrast CT appearance of the brain. 3. Cervical spine degeneration. Electronically Signed   By: Genevie Ann M.D.   On: 07/09/2018 22:44   Ct Cervical Spine Wo Contrast  Result Date: 07/09/2018 CLINICAL DATA:  85 year old female status post fall on anticoagulation. EXAM: CT HEAD WITHOUT CONTRAST CT CERVICAL SPINE WITHOUT CONTRAST TECHNIQUE: Multidetector CT imaging of the head and cervical spine was performed following the standard protocol without intravenous contrast. Multiplanar CT image reconstructions of the cervical spine were also generated. COMPARISON:  Brain MRI 06/15/2011. FINDINGS: CT HEAD FINDINGS Brain: Stable cerebral volume. Chronic cerebral white matter changes appear similar to the prior MRI. No midline shift, ventriculomegaly, mass effect, evidence of mass lesion, intracranial hemorrhage or evidence of cortically based acute infarction. No cortical encephalomalacia identified. Vascular: Calcified atherosclerosis at the skull base. No suspicious intracranial vascular hyperdensity. Skull: No fracture identified. Sinuses/Orbits: Visualized paranasal sinuses and mastoids are stable and well pneumatized. Other: No scalp hematoma. Postoperative changes to both globes since the prior MRI. CT CERVICAL SPINE FINDINGS Alignment: Mild reversal of cervical lordosis. Mild degenerative appearing anterolisthesis at C3-C4 and C4-C5. Cervicothoracic junction alignment is within  normal limits. Bilateral posterior element alignment is within normal limits. Skull base and vertebrae: Visualized skull base is intact. No atlanto-occipital dissociation. No acute osseous abnormality identified. Soft tissues and spinal canal: No prevertebral fluid or swelling. No visible canal hematoma. Mild calcified carotid atherosclerosis. Disc levels: Advanced left side facet arthropathy. Disc and endplate degeneration maximal at C5-C6. However, mild if any cervical spinal stenosis. Upper chest: Visible  upper thoracic levels appear intact. Negative lung apices. IMPRESSION: 1. No acute traumatic injury identified in the head or cervical spine. 2. Negative for age non contrast CT appearance of the brain. 3. Cervical spine degeneration. Electronically Signed   By: Genevie Ann M.D.   On: 07/09/2018 22:44   Dg Chest Port 1 View  Result Date: 07/10/2018 CLINICAL DATA:  Preop EXAM: PORTABLE CHEST 1 VIEW COMPARISON:  01/16/2018 FINDINGS: Heart and mediastinal contours are within normal limits. No focal opacities or effusions. No acute bony abnormality. IMPRESSION: No active disease. Electronically Signed   By: Rolm Baptise M.D.   On: 07/10/2018 00:41   Dg Knee Right Port  Result Date: 07/09/2018 CLINICAL DATA:  Displaced intertrochanteric fracture EXAM: PORTABLE RIGHT KNEE - 1-2 VIEW COMPARISON:  None. FINDINGS: Moderate tricompartment degenerative changes with joint space narrowing and spurring, most pronounced in the medial compartment. No acute bony abnormality. Specifically, no fracture, subluxation, or dislocation. No joint effusion. IMPRESSION: Degenerative changes.  No acute bony abnormality. Electronically Signed   By: Rolm Baptise M.D.   On: 07/09/2018 21:44   Dg Hip Unilat With Pelvis 2-3 Views Right  Result Date: 07/09/2018 CLINICAL DATA:  85 year old female status post fall at home with pain. EXAM: DG HIP (WITH OR WITHOUT PELVIS) 2-3V RIGHT COMPARISON:  Pelvis radiograph 03/16/2015. FINDINGS: Comminuted intertrochanteric fracture of the right femur with varus impaction. Mild displacement of the lesser trochanter butterfly fragment. The right femoral head remains normally located. No fracture of the pelvis identified. Grossly intact proximal left femur. Negative visible bowel gas pattern. Partially visible lumbar vertebral augmentation. IMPRESSION: Comminuted intertrochanteric fracture of the right femur with varus impaction. Electronically Signed   By: Genevie Ann M.D.   On: 07/09/2018 19:58         Scheduled Meds:  [MAR Hold] acetaminophen  1,000 mg Oral Q8H   [MAR Hold] amLODipine  2.5 mg Oral Daily   povidone-iodine  2 application Topical Once   povidone-iodine  2 application Topical Once   Continuous Infusions:   ceFAZolin (ANCEF) IV     [MAR Hold] methocarbamol (ROBAXIN) IV     tranexamic acid       LOS: 1 day    Time spent: over 30 min    Fayrene Helper, MD [facility] Pager [email or web address]  If 7PM-7AM, please contact night-coverage [email or web address] Password [email or web address] 07/10/2018, 11:56 AM

## 2018-07-10 NOTE — Anesthesia Postprocedure Evaluation (Signed)
Anesthesia Post Note  Patient: [name]  Procedure(s) Performed: INTRAMEDULLARY (IM) NAIL INTERTROCHANTRIC (Right Hip)     Patient location during evaluation: PACU Anesthesia Type: General Level of consciousness: sedated and patient cooperative Pain management: pain level controlled Vital Signs Assessment: post-procedure vital signs reviewed and stable Respiratory status: spontaneous breathing Cardiovascular status: stable Anesthetic complications: no    Last Vitals:  Vitals:   07/10/18 0520 07/10/18 1445  BP: (!) 147/66 (!) 159/72  Pulse: 87 (!) 140  Resp: 15 14  Temp: 36.6 C 36.7 C  SpO2: 98% 100%    Last Pain:  Vitals:   07/10/18 1445  TempSrc:   PainSc: [place]

## 2018-07-10 NOTE — Progress Notes (Signed)
SLP Cancellation Note  Patient Details Name: [name] MRN: [id number] DOB: 07-Aug-1922   Cancelled treatment:       Reason Eval/Treat Not Completed: Other (comment)(Patient is NPO for surgery). SLP will follow for patient readiness for BSE.    [name] [name] 07/10/2018, 10:18 AM   Sonia Baller, MA, CCC-SLP Speech Therapy [facility] Pager: [phone]

## 2018-07-10 NOTE — Consult Note (Signed)
[place] Nurse wound consult note Patient receiving care in [place]. Reason for Consult: LLE wound Wound type: This represents a fluid filled area the patient burst prior to hospitalization, that she states went on to become "infected".  There are no signs of infection today. Measurement: 5 cm x 2.3 cm Wound bed: pink with a very thin layer of yellow. Drainage (amount, consistency, odor) none Periwound: intact, normal color and texture Dressing procedure/placement/frequency: Cleanse wound on left lateral lower leg with saline. Pat dry. Apply a Xeroform gauze Kellie Simmering [phone]) over the wound, then dry gauze, then wrap with kerlex. Change every 2 days.  Begin this order 07/11/18.  Considering this patient's age and fractured hip, I am also ordering a low air loss mattress. Monitor the wound area(s) for worsening of condition such as: Signs/symptoms of infection,  Increase in size,  Development of or worsening of odor, Development of pain, or increased pain at the affected locations.  Notify the medical team if any of these develop.  Thank you for the consult.  Discussed plan of care with the patient and bedside nurse.  [place] nurse will not follow at this time.  Please re-consult the [place] team if needed.  Val Riles, RN, MSN, CWOCN, CNS-BC, pager [phone]

## 2018-07-10 NOTE — Discharge Instructions (Signed)
 Dr. Aarion Kittrell Adult Hip & Knee Specialist [facility] [address] [place], [place] [address] [phone]   POSTOPERATIVE DIRECTIONS    Hip Rehabilitation, Guidelines Following Surgery   WEIGHT BEARING Weight bearing as tolerated with assist device (walker, cane, etc) as directed, use it as long as suggested by your surgeon or therapist, typically at least 4-6 weeks.   HOME CARE INSTRUCTIONS  Remove items at home which could result in a fall. This includes throw rugs or furniture in walking pathways.  Continue medications as instructed at time of discharge.  You may have some home medications which will be placed on hold until you complete the course of blood thinner medication.  4 days after discharge, you may start showering. No tub baths or soaking your incisions. Do not put on socks or shoes without following the instructions of your caregivers.   Sit on chairs with arms. Use the chair arms to help push yourself up when arising.  Arrange for the use of a toilet seat elevator so you are not sitting low.   Walk with walker as instructed.  You may resume a sexual relationship in one month or when given the OK by your caregiver.  Use walker as long as suggested by your caregivers.  Avoid periods of inactivity such as sitting longer than an hour when not asleep. This helps prevent blood clots.  You may return to work once you are cleared by your surgeon.  Do not drive a car for 6 weeks or until released by your surgeon.  Do not drive while taking narcotics.  Wear elastic stockings for two weeks following surgery during the day but you may remove then at night.  Make sure you keep all of your appointments after your operation with all of your doctors and caregivers. You should call the office at the above phone number and make an appointment for approximately two weeks after the date of your surgery. Please pick up a stool softener and laxative  for home use as long as you are requiring pain medications.  ICE to the affected hip every three hours for 30 minutes at a time and then as needed for pain and swelling. Continue to use ice on the hip for pain and swelling from surgery. You may notice swelling that will progress down to the foot and ankle.  This is normal after surgery.  Elevate the leg when you are not up walking on it.   It is important for you to complete the blood thinner medication as prescribed by your doctor.  Continue to use the breathing machine which will help keep your temperature down.  It is common for your temperature to cycle up and down following surgery, especially at night when you are not up moving around and exerting yourself.  The breathing machine keeps your lungs expanded and your temperature down.  RANGE OF MOTION AND STRENGTHENING EXERCISES  These exercises are designed to help you keep full movement of your hip joint. Follow your caregiver's or physical therapist's instructions. Perform all exercises about fifteen times, three times per day or as directed. Exercise both hips, even if you have had only one joint replacement. These exercises can be done on a training (exercise) mat, on the floor, on a table or on a bed. Use whatever works the best and is most comfortable for you. Use music or television while you are exercising so that the exercises are a pleasant break in your day. This   will make your life better with the exercises acting as a break in routine you can look forward to.  Lying on your back, slowly slide your foot toward your buttocks, raising your knee up off the floor. Then slowly slide your foot back down until your leg is straight again.  Lying on your back spread your legs as far apart as you can without causing discomfort.  Lying on your side, raise your upper leg and foot straight up from the floor as far as is comfortable. Slowly lower the leg and repeat.  Lying on your back, tighten up the  muscle in the front of your thigh (quadriceps muscles). You can do this by keeping your leg straight and trying to raise your heel off the floor. This helps strengthen the largest muscle supporting your knee.  Lying on your back, tighten up the muscles of your buttocks both with the legs straight and with the knee bent at a comfortable angle while keeping your heel on the floor.   SKILLED REHAB INSTRUCTIONS: If the patient is transferred to a skilled rehab facility following release from the hospital, a list of the current medications will be sent to the facility for the patient to continue.  When discharged from the skilled rehab facility, please have the facility set up the patient's Home Health Physical Therapy prior to being released. Also, the skilled facility will be responsible for providing the patient with their medications at time of release from the facility to include their pain medication and their blood thinner medication. If the patient is still at the rehab facility at time of the two week follow up appointment, the skilled rehab facility will also need to assist the patient in arranging follow up appointment in our office and any transportation needs.  MAKE SURE YOU:  Understand these instructions.  Will watch your condition.  Will get help right away if you are not doing well or get worse.  Pick up stool softner and laxative for home use following surgery while on pain medications. Daily dry dressing changes as needed. In 4 days, you may remove your dressings and begin taking showers - no tub baths or soaking the incisions. Continue to use ice for pain and swelling after surgery. Do not use any lotions or creams on the incision until instructed by your surgeon.   

## 2018-07-10 NOTE — Consult Note (Signed)
ORTHOPAEDIC CONSULTATION  REQUESTING PHYSICIAN: Elodia Florence., *  PCP:  Lajean Manes, MD  Chief Complaint: R hip fx  HPI: [name] is a 85 y.o. female with a history of HTN, breast cancer, L2 compression fracture who sustained a ground-level fall yesterday.  She injured her right hip.  She has right hip pain and inability to weight-bear.  She was brought to the emergency department at [facility], where x-rays revealed a comminuted right intertrochanteric femur fracture.  She was admitted by the hospitalist service for perioperative or stratification and medical optimization.  She lives at [facility].  She uses a walker.  She denies additional injuries.  Past Medical History:  Diagnosis Date   Cancer ([place])    breast - right   Hypertension    Pneumonia    Past Surgical History:  Procedure Laterality Date   ABDOMINAL HYSTERECTOMY  1977   HAND SURGERY  1978   TOE NAIL REMOVAL  2011   Social History   Socioeconomic History   Marital status: Married    Spouse name: Not on file   Number of children: Not on file   Years of education: Not on file   Highest education level: Not on file  Occupational History   Not on file  Social Needs   Financial resource strain: Not on file   Food insecurity    Worry: Not on file    Inability: Not on file   Transportation needs    Medical: Not on file    Non-medical: Not on file  Tobacco Use   Smoking status: Never Smoker   Smokeless tobacco: Never Used  Substance and Sexual Activity   Alcohol use: Yes    Comment: 1 GLASS A MONTH   Drug use: No   Sexual activity: Not on file  Lifestyle   Physical activity    Days per week: Not on file    Minutes per session: Not on file   Stress: Not on file  Relationships   Social connections    Talks on phone: Not on file    Gets together: Not on file    Attends religious service: Not on file    Active member of club or  organization: Not on file    Attends meetings of clubs or organizations: Not on file    Relationship status: Not on file  Other Topics Concern   Not on file  Social History Narrative   Not on file   Family History  Problem Relation Age of Onset   Stroke Mother    Cancer Father    Allergies  Allergen Reactions   Macrobid [Nitrofurantoin Macrocrystal]     Caused shortness of breath.   Prior to Admission medications   Medication Sig Start Date End Date Taking? Authorizing Provider  amLODipine (NORVASC) 2.5 MG tablet Take 2.5 mg by mouth daily.   Yes [provider]  calcium-vitamin D 250-100 MG-UNIT tablet Take 1 tablet by mouth 2 (two) times daily.   Yes [provider]  Cranberry 500 MG CAPS Take by mouth.   Yes [provider]  diphenhydrAMINE (BENADRYL) 25 MG tablet Take 25 mg by mouth at bedtime.   Yes [provider]  furosemide (LASIX) 40 MG tablet Take 40 mg by mouth Daily.  04/24/11  Yes [provider]  Glucosamine Sulfate 1000 MG CAPS Take 1 capsule by mouth daily.    Yes [provider]  naproxen sodium (ALEVE) 220  MG tablet Take 220 mg by mouth daily.   Yes [provider]  sulfamethoxazole-trimethoprim (BACTRIM DS) 800-160 MG tablet Take 1 tablet by mouth 2 (two) times daily.   Yes [provider]  diclofenac sodium (VOLTAREN) 1 % GEL Apply 2 g topically 4 (four) times daily. Rub into affected area of foot 2 to 4 times daily Patient not taking: Reported on 07/09/2018 11/29/14   Trula Slade, DPM   Ct Head Wo Contrast  Result Date: 07/09/2018 CLINICAL DATA:  85 year old female status post fall on anticoagulation. EXAM: CT HEAD WITHOUT CONTRAST CT CERVICAL SPINE WITHOUT CONTRAST TECHNIQUE: Multidetector CT imaging of the head and cervical spine was performed following the standard protocol without intravenous contrast. Multiplanar CT image reconstructions of the cervical spine were also generated.  COMPARISON:  Brain MRI 06/15/2011. FINDINGS: CT HEAD FINDINGS Brain: Stable cerebral volume. Chronic cerebral white matter changes appear similar to the prior MRI. No midline shift, ventriculomegaly, mass effect, evidence of mass lesion, intracranial hemorrhage or evidence of cortically based acute infarction. No cortical encephalomalacia identified. Vascular: Calcified atherosclerosis at the skull base. No suspicious intracranial vascular hyperdensity. Skull: No fracture identified. Sinuses/Orbits: Visualized paranasal sinuses and mastoids are stable and well pneumatized. Other: No scalp hematoma. Postoperative changes to both globes since the prior MRI. CT CERVICAL SPINE FINDINGS Alignment: Mild reversal of cervical lordosis. Mild degenerative appearing anterolisthesis at C3-C4 and C4-C5. Cervicothoracic junction alignment is within normal limits. Bilateral posterior element alignment is within normal limits. Skull base and vertebrae: Visualized skull base is intact. No atlanto-occipital dissociation. No acute osseous abnormality identified. Soft tissues and spinal canal: No prevertebral fluid or swelling. No visible canal hematoma. Mild calcified carotid atherosclerosis. Disc levels: Advanced left side facet arthropathy. Disc and endplate degeneration maximal at C5-C6. However, mild if any cervical spinal stenosis. Upper chest: Visible upper thoracic levels appear intact. Negative lung apices. IMPRESSION: 1. No acute traumatic injury identified in the head or cervical spine. 2. Negative for age non contrast CT appearance of the brain. 3. Cervical spine degeneration. Electronically Signed   By: Genevie Ann M.D.   On: 07/09/2018 22:44   Ct Cervical Spine Wo Contrast  Result Date: 07/09/2018 CLINICAL DATA:  85 year old female status post fall on anticoagulation. EXAM: CT HEAD WITHOUT CONTRAST CT CERVICAL SPINE WITHOUT CONTRAST TECHNIQUE: Multidetector CT imaging of the head and cervical spine was performed following  the standard protocol without intravenous contrast. Multiplanar CT image reconstructions of the cervical spine were also generated. COMPARISON:  Brain MRI 06/15/2011. FINDINGS: CT HEAD FINDINGS Brain: Stable cerebral volume. Chronic cerebral white matter changes appear similar to the prior MRI. No midline shift, ventriculomegaly, mass effect, evidence of mass lesion, intracranial hemorrhage or evidence of cortically based acute infarction. No cortical encephalomalacia identified. Vascular: Calcified atherosclerosis at the skull base. No suspicious intracranial vascular hyperdensity. Skull: No fracture identified. Sinuses/Orbits: Visualized paranasal sinuses and mastoids are stable and well pneumatized. Other: No scalp hematoma. Postoperative changes to both globes since the prior MRI. CT CERVICAL SPINE FINDINGS Alignment: Mild reversal of cervical lordosis. Mild degenerative appearing anterolisthesis at C3-C4 and C4-C5. Cervicothoracic junction alignment is within normal limits. Bilateral posterior element alignment is within normal limits. Skull base and vertebrae: Visualized skull base is intact. No atlanto-occipital dissociation. No acute osseous abnormality identified. Soft tissues and spinal canal: No prevertebral fluid or swelling. No visible canal hematoma. Mild calcified carotid atherosclerosis. Disc levels: Advanced left side facet arthropathy. Disc and endplate degeneration maximal at C5-C6. However, mild if any  cervical spinal stenosis. Upper chest: Visible upper thoracic levels appear intact. Negative lung apices. IMPRESSION: 1. No acute traumatic injury identified in the head or cervical spine. 2. Negative for age non contrast CT appearance of the brain. 3. Cervical spine degeneration. Electronically Signed   By: Genevie Ann M.D.   On: 07/09/2018 22:44   Dg Chest Port 1 View  Result Date: 07/10/2018 CLINICAL DATA:  Preop EXAM: PORTABLE CHEST 1 VIEW COMPARISON:  01/16/2018 FINDINGS: Heart and mediastinal  contours are within normal limits. No focal opacities or effusions. No acute bony abnormality. IMPRESSION: No active disease. Electronically Signed   By: Rolm Baptise M.D.   On: 07/10/2018 00:41   Dg Knee Right Port  Result Date: 07/09/2018 CLINICAL DATA:  Displaced intertrochanteric fracture EXAM: PORTABLE RIGHT KNEE - 1-2 VIEW COMPARISON:  None. FINDINGS: Moderate tricompartment degenerative changes with joint space narrowing and spurring, most pronounced in the medial compartment. No acute bony abnormality. Specifically, no fracture, subluxation, or dislocation. No joint effusion. IMPRESSION: Degenerative changes.  No acute bony abnormality. Electronically Signed   By: Rolm Baptise M.D.   On: 07/09/2018 21:44   Dg Hip Unilat With Pelvis 2-3 Views Right  Result Date: 07/09/2018 CLINICAL DATA:  85 year old female status post fall at home with pain. EXAM: DG HIP (WITH OR WITHOUT PELVIS) 2-3V RIGHT COMPARISON:  Pelvis radiograph 03/16/2015. FINDINGS: Comminuted intertrochanteric fracture of the right femur with varus impaction. Mild displacement of the lesser trochanter butterfly fragment. The right femoral head remains normally located. No fracture of the pelvis identified. Grossly intact proximal left femur. Negative visible bowel gas pattern. Partially visible lumbar vertebral augmentation. IMPRESSION: Comminuted intertrochanteric fracture of the right femur with varus impaction. Electronically Signed   By: Genevie Ann M.D.   On: 07/09/2018 19:58    Positive ROS: All other systems have been reviewed and were otherwise negative with the exception of those mentioned in the HPI and as above.  Physical Exam: General: Alert, no acute distress Cardiovascular: No pedal edema Respiratory: No cyanosis, no use of accessory musculature GI: No organomegaly, abdomen is soft and non-tender Skin: No lesions in the area of chief complaint Neurologic: Sensation intact distally Psychiatric: Patient is competent for  consent with normal mood and affect Lymphatic: No axillary or cervical lymphadenopathy  MUSCULOSKELETAL: Examination of the right lower extremity reveals that she is shortened and rotated.  No hip wounds or lesions.  She has positive motor function dorsiflexion, plantarflexion, and great toe extension with strength limited by pain.  She has palpable pulses.  She reports intact sensation.  Assessment: Comminuted displaced right intertrochanteric femur fracture.  Plan: I discussed the findings with the patient.  She has an unstable right intertrochanteric femur fracture that requires surgical stabilization.  We discussed the risk, benefits, and alternatives to intramedullary fixation of the right femur.  Plan for surgery today.  All questions solicited and answered.  The risks, benefits, and alternatives were discussed with the patient. There are risks associated with the surgery including, but not limited to, problems with anesthesia (death), infection, differences in leg length/angulation/rotation, fracture of bones, loosening or failure of implants, malunion, nonunion, hematoma (blood accumulation) which may require surgical drainage, blood clots, pulmonary embolism, nerve injury (foot drop), and blood vessel injury. The patient understands these risks and elects to proceed.   Bertram Savin, MD Cell [phone]    07/10/2018 10:12 AM

## 2018-07-10 NOTE — Transfer of Care (Signed)
Immediate Anesthesia Transfer of Care Note  Patient: [name]  Procedure(s) Performed: INTRAMEDULLARY (IM) NAIL INTERTROCHANTRIC (Right Hip)  Patient Location: PACU  Anesthesia Type:General  Level of Consciousness: awake, alert , oriented and patient cooperative  Airway & Oxygen Therapy: Patient Spontanous Breathing and Patient connected to face mask oxygen  Post-op Assessment: Report given to RN, Post -op Vital signs reviewed and stable and Patient moving all extremities  Post vital signs: Reviewed and stable  Last Vitals:  Vitals Value Taken Time  BP 159/72 07/10/18 1445  Temp    Pulse 82 07/10/18 1445  Resp 11 07/10/18 1445  SpO2 100 % 07/10/18 1445  Vitals shown include unvalidated device data.  Last Pain:  Vitals:   07/10/18 1149  TempSrc:   PainSc: 7       Patients Stated Pain Goal: 3 (50/35/46 5681)  Complications: No apparent anesthesia complications

## 2018-07-10 NOTE — ED Notes (Signed)
ED TO INPATIENT HANDOFF REPORT  Name/Age/Gender [name] 85 y.o. female  Code Status    Code Status Orders  (From admission, onward)         Start     Ordered   07/10/18 0006  Do not attempt resuscitation (DNR)  Continuous    Question Answer Comment  In the event of cardiac or respiratory ARREST Do not call a "code blue"   In the event of cardiac or respiratory ARREST Do not perform Intubation, CPR, defibrillation or ACLS   In the event of cardiac or respiratory ARREST Use medication by any route, position, wound care, and other measures to relive pain and suffering. May use oxygen, suction and manual treatment of airway obstruction as needed for comfort.      07/10/18 0005        Code Status History    This patient has a current code status but no historical code status.   Advance Care Planning Activity    Advance Directive Documentation     Most Recent Value  Type of Advance Directive  Healthcare Power of Attorney, Living will  Pre-existing out of facility DNR order (yellow form or pink MOST form)  -  "MOST" Form in Place?  -      Home/SNF/Other Nursing Home  Chief Complaint hip pain  Level of Care/Admitting Diagnosis ED Disposition    ED Disposition Condition [place]: [facility] [[id number]]  Level of Care: Med-Surg [16]  Covid Evaluation: Confirmed COVID Negative  Diagnosis: Closed right hip fracture [facility]) [[id number]]  Admitting Physician: Toy Baker [[id number]]  Attending Physician: Toy Baker [[id number]]  Estimated length of stay: 3 - 4 days  Certification:: I certify this patient will need inpatient services for at least 2 midnights  PT Class (Do Not Modify): Inpatient [101]  PT Acc Code (Do Not Modify): Private [1]       Medical History Past Medical History:  Diagnosis Date  . Cancer [facility])    breast - right  . Hypertension   . Pneumonia     Allergies Allergies  Allergen Reactions  .  Macrobid [Nitrofurantoin Macrocrystal]     Caused shortness of breath.    IV Location/Drains/Wounds Patient Lines/Drains/Airways Status   Active Line/Drains/Airways    Name:   Placement date:   Placement time:   Site:   Days:   Peripheral IV 07/09/18 Left Forearm   07/09/18    1844    Forearm   1   Peripheral IV 07/09/18 Right Forearm   07/09/18    1923    Forearm   1          Labs/Imaging Results for orders placed or performed during the hospital encounter of 07/09/18 (from the past 48 hour(s))  Basic metabolic panel     Status: Abnormal   Collection Time: 07/09/18  6:52 PM  Result Value Ref Range   Sodium 137 135 - 145 mmol/L   Potassium 4.5 3.5 - 5.1 mmol/L   Chloride 106 98 - 111 mmol/L   CO2 25 22 - 32 mmol/L   Glucose, Bld 133 (H) 70 - 99 mg/dL   BUN 35 (H) 8 - 23 mg/dL   Creatinine, Ser 1.36 (H) 0.44 - 1.00 mg/dL   Calcium 8.7 (L) 8.9 - 10.3 mg/dL   GFR calc non Af Amer 33 (L) >60 mL/min   GFR calc Af Amer 39 (L) >60 mL/min   Anion gap 6 5 -  15    Comment: Performed at [facility], [place] [address], [place], [place] [address]  CBC WITH DIFFERENTIAL     Status: Abnormal   Collection Time: 07/09/18  6:52 PM  Result Value Ref Range   WBC 8.5 4.0 - 10.5 K/uL   RBC 3.37 (L) 3.87 - 5.11 MIL/uL   Hemoglobin 11.6 (L) 12.0 - 15.0 g/dL   HCT 35.6 (L) 36.0 - 46.0 %   MCV 105.6 (H) 80.0 - 100.0 fL   MCH 34.4 (H) 26.0 - 34.0 pg   MCHC 32.6 30.0 - 36.0 g/dL   RDW 13.4 11.5 - 15.5 %   Platelets 271 150 - 400 K/uL   nRBC 0.0 0.0 - 0.2 %   Neutrophils Relative % 70 %   Neutro Abs 6.0 1.7 - 7.7 K/uL   Lymphocytes Relative 19 %   Lymphs Abs 1.6 0.7 - 4.0 K/uL   Monocytes Relative 10 %   Monocytes Absolute 0.8 0.1 - 1.0 K/uL   Eosinophils Relative 1 %   Eosinophils Absolute 0.0 0.0 - 0.5 K/uL   Basophils Relative 0 %   Basophils Absolute 0.0 0.0 - 0.1 K/uL   Immature Granulocytes 0 %   Abs Immature Granulocytes 0.03 0.00 - 0.07 K/uL    Comment: Performed  at [facility], [place] [address], [place], [place] [address]  Protime-INR     Status: Abnormal   Collection Time: 07/09/18  6:52 PM  Result Value Ref Range   Prothrombin Time 15.8 (H) 11.4 - 15.2 seconds   INR 1.3 (H) 0.8 - 1.2    Comment: (NOTE) INR goal varies based on device and disease states. Performed at [facility], [place] [address], [place], [place] [address]   ABO/Rh     Status: None (Preliminary result)   Collection Time: 07/09/18  6:52 PM  Result Value Ref Range   ABO/RH(D)      Jenetta Downer NEG Performed at [facility], [place] [address] [place], [place] [address]   Type and screen [place]     Status: None   Collection Time: 07/09/18  7:23 PM  Result Value Ref Range   ABO/RH(D) O NEG    Antibody Screen NEG    Sample Expiration      07/12/2018,2359 Performed at [facility], [place] [address], [place], [place] [address]   SARS Coronavirus 2 (CEPHEID - Performed in [place] hospital lab), Hosp Order     Status: None   Collection Time: 07/09/18  8:10 PM   Specimen: Nasopharyngeal Swab  Result Value Ref Range   SARS Coronavirus 2 NEGATIVE NEGATIVE    Comment: (NOTE) If result is NEGATIVE SARS-CoV-2 target nucleic acids are NOT DETECTED. The SARS-CoV-2 RNA is generally detectable in upper and lower  respiratory specimens during the acute phase of infection. The lowest  concentration of SARS-CoV-2 viral copies this assay can detect is 250  copies / mL. A negative result does not preclude SARS-CoV-2 infection  and should not be used as the sole basis for treatment or other  patient management decisions.  A negative result may occur with  improper specimen collection / handling, submission of specimen other  than nasopharyngeal swab, presence of viral mutation(s) within the  areas targeted by this assay, and inadequate number of viral copies  (<250 copies / mL). A negative result must  be combined with clinical  observations, patient history, and epidemiological information. If result is  POSITIVE SARS-CoV-2 target nucleic acids are DETECTED. The SARS-CoV-2 RNA is generally detectable in upper and lower  respiratory specimens dur ing the acute phase of infection.  Positive  results are indicative of active infection with SARS-CoV-2.  Clinical  correlation with patient history and other diagnostic information is  necessary to determine patient infection status.  Positive results do  not rule out bacterial infection or co-infection with other viruses. If result is PRESUMPTIVE POSTIVE SARS-CoV-2 nucleic acids MAY BE PRESENT.   A presumptive positive result was obtained on the submitted specimen  and confirmed on repeat testing.  While 2019 novel coronavirus  (SARS-CoV-2) nucleic acids may be present in the submitted sample  additional confirmatory testing may be necessary for epidemiological  and / or clinical management purposes  to differentiate between  SARS-CoV-2 and other Sarbecovirus currently known to infect humans.  If clinically indicated additional testing with an alternate test  methodology [id number]) is advised. The SARS-CoV-2 RNA is generally  detectable in upper and lower respiratory sp ecimens during the acute  phase of infection. The expected result is Negative. Fact Sheet for Patients:  StrictlyIdeas.no Fact Sheet for Healthcare Providers: BankingDealers.co.za This test is not yet approved or cleared by the [place] FDA and has been authorized for detection and/or diagnosis of SARS-CoV-2 by FDA under an Emergency Use Authorization (EUA).  This EUA will remain in effect (meaning this test can be used) for the duration of the COVID-19 declaration under Section 564(b)(1) of the Act, 21 U.S.C. section 360bbb-3(b)(1), unless the authorization is terminated or revoked sooner. Performed at [facility], [place] [address] [place], [place] [address]    Ct Head Wo Contrast  Result Date: 07/09/2018 CLINICAL DATA:  85 year old female status post fall on anticoagulation. EXAM: CT HEAD WITHOUT CONTRAST CT CERVICAL SPINE WITHOUT CONTRAST TECHNIQUE: Multidetector CT imaging of the head and cervical spine was performed following the standard protocol without intravenous contrast. Multiplanar CT image reconstructions of the cervical spine were also generated. COMPARISON:  Brain MRI 06/15/2011. FINDINGS: CT HEAD FINDINGS Brain: Stable cerebral volume. Chronic cerebral white matter changes appear similar to the prior MRI. No midline shift, ventriculomegaly, mass effect, evidence of mass lesion, intracranial hemorrhage or evidence of cortically based acute infarction. No cortical encephalomalacia identified. Vascular: Calcified atherosclerosis at the skull base. No suspicious intracranial vascular hyperdensity. Skull: No fracture identified. Sinuses/Orbits: Visualized paranasal sinuses and mastoids are stable and well pneumatized. Other: No scalp hematoma. Postoperative changes to both globes since the prior MRI. CT CERVICAL SPINE FINDINGS Alignment: Mild reversal of cervical lordosis. Mild degenerative appearing anterolisthesis at C3-C4 and C4-C5. Cervicothoracic junction alignment is within normal limits. Bilateral posterior element alignment is within normal limits. Skull base and vertebrae: Visualized skull base is intact. No atlanto-occipital dissociation. No acute osseous abnormality identified. Soft tissues and spinal canal: No prevertebral fluid or swelling. No visible canal hematoma. Mild calcified carotid atherosclerosis. Disc levels: Advanced left side facet arthropathy. Disc and endplate degeneration maximal at C5-C6. However, mild if any cervical spinal stenosis. Upper chest: Visible upper thoracic levels appear intact. Negative lung apices. IMPRESSION: 1. No acute traumatic injury identified  in the head or cervical spine. 2. Negative for age non contrast CT appearance of the brain. 3. Cervical spine degeneration. Electronically Signed   By: Genevie Ann M.D.   On: 07/09/2018 22:44   Ct Cervical Spine Wo Contrast  Result Date: 07/09/2018 CLINICAL DATA:  85 year old female status post fall on anticoagulation. EXAM: CT HEAD WITHOUT CONTRAST  CT CERVICAL SPINE WITHOUT CONTRAST TECHNIQUE: Multidetector CT imaging of the head and cervical spine was performed following the standard protocol without intravenous contrast. Multiplanar CT image reconstructions of the cervical spine were also generated. COMPARISON:  Brain MRI 06/15/2011. FINDINGS: CT HEAD FINDINGS Brain: Stable cerebral volume. Chronic cerebral white matter changes appear similar to the prior MRI. No midline shift, ventriculomegaly, mass effect, evidence of mass lesion, intracranial hemorrhage or evidence of cortically based acute infarction. No cortical encephalomalacia identified. Vascular: Calcified atherosclerosis at the skull base. No suspicious intracranial vascular hyperdensity. Skull: No fracture identified. Sinuses/Orbits: Visualized paranasal sinuses and mastoids are stable and well pneumatized. Other: No scalp hematoma. Postoperative changes to both globes since the prior MRI. CT CERVICAL SPINE FINDINGS Alignment: Mild reversal of cervical lordosis. Mild degenerative appearing anterolisthesis at C3-C4 and C4-C5. Cervicothoracic junction alignment is within normal limits. Bilateral posterior element alignment is within normal limits. Skull base and vertebrae: Visualized skull base is intact. No atlanto-occipital dissociation. No acute osseous abnormality identified. Soft tissues and spinal canal: No prevertebral fluid or swelling. No visible canal hematoma. Mild calcified carotid atherosclerosis. Disc levels: Advanced left side facet arthropathy. Disc and endplate degeneration maximal at C5-C6. However, mild if any cervical spinal stenosis.  Upper chest: Visible upper thoracic levels appear intact. Negative lung apices. IMPRESSION: 1. No acute traumatic injury identified in the head or cervical spine. 2. Negative for age non contrast CT appearance of the brain. 3. Cervical spine degeneration. Electronically Signed   By: Genevie Ann M.D.   On: 07/09/2018 22:44   Dg Knee Right Port  Result Date: 07/09/2018 CLINICAL DATA:  Displaced intertrochanteric fracture EXAM: PORTABLE RIGHT KNEE - 1-2 VIEW COMPARISON:  None. FINDINGS: Moderate tricompartment degenerative changes with joint space narrowing and spurring, most pronounced in the medial compartment. No acute bony abnormality. Specifically, no fracture, subluxation, or dislocation. No joint effusion. IMPRESSION: Degenerative changes.  No acute bony abnormality. Electronically Signed   By: Rolm Baptise M.D.   On: 07/09/2018 21:44   Dg Hip Unilat With Pelvis 2-3 Views Right  Result Date: 07/09/2018 CLINICAL DATA:  85 year old female status post fall at home with pain. EXAM: DG HIP (WITH OR WITHOUT PELVIS) 2-3V RIGHT COMPARISON:  Pelvis radiograph 03/16/2015. FINDINGS: Comminuted intertrochanteric fracture of the right femur with varus impaction. Mild displacement of the lesser trochanter butterfly fragment. The right femoral head remains normally located. No fracture of the pelvis identified. Grossly intact proximal left femur. Negative visible bowel gas pattern. Partially visible lumbar vertebral augmentation. IMPRESSION: Comminuted intertrochanteric fracture of the right femur with varus impaction. Electronically Signed   By: Genevie Ann M.D.   On: 07/09/2018 19:58    Pending Labs Unresulted Labs (From admission, onward)    Start     Ordered   07/09/18 2327  Creatinine, urine, random  Once,   STAT     07/09/18 2326   07/09/18 2327  Sodium, urine, random  Once,   STAT     07/09/18 2326   Signed and Held  CBC  Tomorrow morning,   R     Signed and Held   Signed and Held  Basic metabolic panel   Tomorrow morning,   R     Signed and Held   Signed and Held  Albumin  Tomorrow morning,   R     Signed and Held   Signed and Held  VITAMIN D 25 Hydroxy (Vit-D Deficiency, Fractures)  Tomorrow morning,   R     Signed and  Held          Vitals/Pain Today's Vitals   07/09/18 2130 07/09/18 2200 07/09/18 2322 07/10/18 0027  BP: (!) 169/143 (!) 122/58  (!) 138/54  Pulse: 87 (!) 48  81  Resp: 15 19  16   Temp:      TempSrc:      SpO2: 95% 93%  93%  Weight:      Height:      PainSc:   5      Isolation Precautions No active isolations  Medications Medications  fentaNYL (SUBLIMAZE) injection 50 mcg (50 mcg Intravenous Given 07/09/18 1827)  sodium chloride 0.9 % bolus 500 mL (0 mLs Intravenous Stopped 07/09/18 2159)  morphine 4 MG/ML injection 4 mg (4 mg Intravenous Given 07/09/18 2131)    Mobility walks with device

## 2018-07-11 LAB — URINE CULTURE: Culture: NO GROWTH

## 2018-07-11 LAB — CBC
HCT: 29.9 % — ABNORMAL LOW (ref 36.0–46.0)
Hemoglobin: 9.5 g/dL — ABNORMAL LOW (ref 12.0–15.0)
MCH: 33.8 pg (ref 26.0–34.0)
MCHC: 31.8 g/dL (ref 30.0–36.0)
MCV: 106.4 fL — ABNORMAL HIGH (ref 80.0–100.0)
Platelets: 219 10*3/uL (ref 150–400)
RBC: 2.81 MIL/uL — ABNORMAL LOW (ref 3.87–5.11)
RDW: 13.3 % (ref 11.5–15.5)
WBC: 10.6 10*3/uL — ABNORMAL HIGH (ref 4.0–10.5)
nRBC: 0 % (ref 0.0–0.2)

## 2018-07-11 LAB — BASIC METABOLIC PANEL
Anion gap: 7 (ref 5–15)
BUN: 16 mg/dL (ref 8–23)
CO2: 22 mmol/L (ref 22–32)
Calcium: 8.1 mg/dL — ABNORMAL LOW (ref 8.9–10.3)
Chloride: 109 mmol/L (ref 98–111)
Creatinine, Ser: 0.86 mg/dL (ref 0.44–1.00)
GFR calc Af Amer: >60 mL/min (ref >60)
GFR calc non Af Amer: 58 mL/min — ABNORMAL LOW (ref >60)
Glucose, Bld: 154 mg/dL — ABNORMAL HIGH (ref 70–99)
Potassium: 4.7 mmol/L (ref 3.5–5.1)
Sodium: 138 mmol/L (ref 135–145)

## 2018-07-11 LAB — VITAMIN D 25 HYDROXY (VIT D DEFICIENCY, FRACTURES): Vit D, 25-Hydroxy: 29.2 ng/mL — ABNORMAL LOW (ref 30.0–100.0)

## 2018-07-11 MED ORDER — OXYCODONE HCL 5 MG PO TABS: 2.5000 mg | ORAL_TABLET | ORAL | 0 refills | Status: DC | PRN

## 2018-07-11 MED ORDER — ASPIRIN 81 MG PO CHEW: 81.0000 mg | CHEWABLE_TABLET | Freq: Two times a day (BID) | ORAL | 1 refills | Status: DC

## 2018-07-11 MED ORDER — VITAMIN D 25 MCG (1000 UNIT) PO TABS
1000.0000 [IU] | ORAL_TABLET | Freq: Every day | ORAL | Status: DC
  Administered 2018-07-11 – 2018-07-15 (×5): 1000 [IU] via ORAL
  Filled 2018-07-11 (×5): qty 1

## 2018-07-11 MED ORDER — ACETAMINOPHEN 500 MG PO TABS
1000.0000 mg | ORAL_TABLET | Freq: Three times a day (TID) | ORAL | Status: AC
  Administered 2018-07-11 – 2018-07-13 (×9): 1000 mg via ORAL
  Filled 2018-07-11 (×9): qty 2

## 2018-07-11 NOTE — Progress Notes (Signed)
PROGRESS NOTE    [name]  IRS:[id number] DOB: 1922/06/25 DOA: 07/09/2018 PCP: Rachael Manes, MD   Brief Narrative:  85 yo with hx of HTN, breast cancer who presented after [name] mechanical fall with R femur fracture.   Assessment & Plan:   Active Problems:   History of breast cancer   Essential hypertension   AKI (acute kidney injury) ([place])   Closed right hip fracture (HCC)   Closed intertrochanteric fracture of hip, right, initial encounter [facility])   Right hip fracture   Mechanical Fall:  Imaging with intertrochanteric fracture of the R femur with varus impaction Knee plain films negative Head and c spine CT without acute findings S/p intramedullary fixation of R femur on 7/2 Start vitamin D supplementation with deficiency Plan for lovenox while in house and d/c on ASA for DVT ppx  HTN: holding lasix, continue amlodipine  AKI: baseline creatinine appears to be ~1, creatinine was 1.36 at presentation.  Improved.  Pyuria: follow urine culture -> no growth  LLE wound: wound care c/s, appreciate recommendations.    DVT prophylaxis: SCD Code Status: DNR Family Communication: none at bedside - discussed with husband Disposition Plan: pending OR and clearance by ortho   Consultants:   orthopedics  Procedures:   none  Antimicrobials:  Anti-infectives (From admission, onward)   Start     Dose/Rate Route Frequency Ordered Stop   07/10/18 1800  ceFAZolin (ANCEF) IVPB 2g/100 mL premix     2 g 200 mL/hr over 30 Minutes Intravenous Every 6 hours 07/10/18 1645 07/11/18 0034   07/10/18 0600  ceFAZolin (ANCEF) IVPB 2g/100 mL premix     2 g 200 mL/hr over 30 Minutes Intravenous On call to O.R. 07/10/18 0125 07/10/18 1315     Subjective: Feels ok today.    Objective: Vitals:   07/11/18 0056 07/11/18 0459 07/11/18 1000 07/11/18 1333  BP: (!) 155/72 (!) 152/75 137/64 (!) 124/97  Pulse: 81 81 89 86  Resp: 16 16 16 16   Temp: 98.5 F (36.9 C) 98.4 F (36.9 C) 98 F  (36.7 C) 97.8 F (36.6 C)  TempSrc: Oral     SpO2: 97% 99% 98% 99%  Weight:      Height:        Intake/Output Summary (Last 24 hours) at 07/11/2018 1644 Last data filed at 07/11/2018 1401 Gross per 24 hour  Intake 1373.9 ml  Output 1750 ml  Net -376.1 ml   Filed Weights   07/09/18 1808 07/10/18 0124  Weight: 71.2 kg 71.4 kg    Examination:  General: No acute distress. Cardiovascular: Heart sounds show [name] regular rate, and rhythm.  Lungs: Clear to auscultation bilaterally Abdomen: Soft, nontender, nondistended Neurological: Alert and oriented 3. Moves all extremities 4. Cranial nerves II through XII grossly intact. Skin: Warm and dry. No rashes or lesions. Extremities: RLE with dressing intact     Data Reviewed: I have personally reviewed following labs and imaging studies  CBC: Recent Labs  Lab 07/09/18 1852 07/10/18 0412 07/11/18 0254  WBC 8.5 11.6* 10.6*  NEUTROABS 6.0  --   --   HGB 11.6* 10.7* 9.5*  HCT 35.6* 33.1* 29.9*  MCV 105.6* 106.1* 106.4*  PLT 271 224 009   Basic Metabolic Panel: Recent Labs  Lab 07/09/18 1852 07/10/18 0412 07/11/18 0254  NA 137 138 138  K 4.5 4.7 4.7  CL 106 109 109  CO2 25 21* 22  GLUCOSE 133* 227* 154*  BUN 35* 28* 16  CREATININE 1.36*  1.12* 0.86  CALCIUM 8.7* 8.4* 8.1*   GFR: Estimated Creatinine Clearance: 37.9 mL/min (by C-G formula based on SCr of 0.86 mg/dL). Liver Function Tests: Recent Labs  Lab 07/10/18 0412  ALBUMIN 3.6   No results for input(s): LIPASE, AMYLASE in the last 168 hours. No results for input(s): AMMONIA in the last 168 hours. Coagulation Profile: Recent Labs  Lab 07/09/18 1852  INR 1.3*   Cardiac Enzymes: No results for input(s): CKTOTAL, CKMB, CKMBINDEX, TROPONINI in the last 168 hours. BNP (last 3 results) No results for input(s): PROBNP in the last 8760 hours. HbA1C: No results for input(s): HGBA1C in the last 72 hours. CBG: No results for input(s): GLUCAP in the last 168  hours. Lipid Profile: No results for input(s): CHOL, HDL, LDLCALC, TRIG, CHOLHDL, LDLDIRECT in the last 72 hours. Thyroid Function Tests: No results for input(s): TSH, T4TOTAL, FREET4, T3FREE, THYROIDAB in the last 72 hours. Anemia Panel: No results for input(s): VITAMINB12, FOLATE, FERRITIN, TIBC, IRON, RETICCTPCT in the last 72 hours. Sepsis Labs: No results for input(s): PROCALCITON, LATICACIDVEN in the last 168 hours.  Recent Results (from the past 240 hour(s))  SARS Coronavirus 2 (CEPHEID - Performed in [place] hospital lab), Hosp Order     Status: None   Collection Time: 07/09/18  8:10 PM   Specimen: Nasopharyngeal Swab  Result Value Ref Range Status   SARS Coronavirus 2 NEGATIVE NEGATIVE Final    Comment: (NOTE) If result is NEGATIVE SARS-CoV-2 target nucleic acids are NOT DETECTED. The SARS-CoV-2 RNA is generally detectable in upper and lower  respiratory specimens during the acute phase of infection. The lowest  concentration of SARS-CoV-2 viral copies this assay can detect is 250  copies / mL. [name] negative result does not preclude SARS-CoV-2 infection  and should not be used as the sole basis for treatment or other  patient management decisions.  [name] negative result may occur with  improper specimen collection / handling, submission of specimen other  than nasopharyngeal swab, presence of viral mutation(s) within the  areas targeted by this assay, and inadequate number of viral copies  (<250 copies / mL). [name] negative result must be combined with clinical  observations, patient history, and epidemiological information. If result is POSITIVE SARS-CoV-2 target nucleic acids are DETECTED. The SARS-CoV-2 RNA is generally detectable in upper and lower  respiratory specimens dur ing the acute phase of infection.  Positive  results are indicative of active infection with SARS-CoV-2.  Clinical  correlation with patient history and other diagnostic information is  necessary to  determine patient infection status.  Positive results do  not rule out bacterial infection or co-infection with other viruses. If result is PRESUMPTIVE POSTIVE SARS-CoV-2 nucleic acids MAY BE PRESENT.   [name] presumptive positive result was obtained on the submitted specimen  and confirmed on repeat testing.  While 2019 novel coronavirus  (SARS-CoV-2) nucleic acids may be present in the submitted sample  additional confirmatory testing may be necessary for epidemiological  and / or clinical management purposes  to differentiate between  SARS-CoV-2 and other Sarbecovirus currently known to infect humans.  If clinically indicated additional testing with an alternate test  methodology [phone]) is advised. The SARS-CoV-2 RNA is generally  detectable in upper and lower respiratory sp ecimens during the acute  phase of infection. The expected result is Negative. Fact Sheet for Patients:  StrictlyIdeas.no Fact Sheet for Healthcare Providers: BankingDealers.co.za This test is not yet approved or cleared by the [place] FDA and has  been authorized for detection and/or diagnosis of SARS-CoV-2 by FDA under an Emergency Use Authorization (EUA).  This EUA will remain in effect (meaning this test can be used) for the duration of the COVID-19 declaration under Section 564(b)(1) of the Act, 21 U.S.C. section 360bbb-3(b)(1), unless the authorization is terminated or revoked sooner. Performed at [facility], [place] [address], [place], [place] [address]   MRSA PCR Screening     Status: None   Collection Time: 07/10/18  1:56 AM   Specimen: Nasal Mucosa; Nasopharyngeal  Result Value Ref Range Status   MRSA by PCR NEGATIVE NEGATIVE Final    Comment:        The GeneXpert MRSA Assay (FDA approved for NASAL specimens only), is one component of Dequavius Kuhner comprehensive MRSA colonization surveillance program. It is not intended to diagnose  MRSA infection nor to guide or monitor treatment for MRSA infections. Performed at [facility], [place] [address], [place], [place] [address]   Culture, Urine     Status: None   Collection Time: 07/10/18  6:28 AM   Specimen: Urine, Clean Catch  Result Value Ref Range Status   Specimen Description   Final    URINE, CLEAN CATCH Performed at [facility], [place] [address] [place], [place] [address]    Special Requests   Final    NONE Performed at [facility], [place] [address], [place], [place] [address]    Culture   Final    NO GROWTH Performed at [facility], [place] [address], [place], [place] [address]    Report Status 07/11/2018 FINAL  Final         Radiology Studies: Ct Head Wo Contrast  Result Date: 07/09/2018 CLINICAL DATA:  85 year old female status post fall on anticoagulation. EXAM: CT HEAD WITHOUT CONTRAST CT CERVICAL SPINE WITHOUT CONTRAST TECHNIQUE: Multidetector CT imaging of the head and cervical spine was performed following the standard protocol without intravenous contrast. Multiplanar CT image reconstructions of the cervical spine were also generated. COMPARISON:  Brain MRI 06/15/2011. FINDINGS: CT HEAD FINDINGS Brain: Stable cerebral volume. Chronic cerebral white matter changes appear similar to the prior MRI. No midline shift, ventriculomegaly, mass effect, evidence of mass lesion, intracranial hemorrhage or evidence of cortically based acute infarction. No cortical encephalomalacia identified. Vascular: Calcified atherosclerosis at the skull base. No suspicious intracranial vascular hyperdensity. Skull: No fracture identified. Sinuses/Orbits: Visualized paranasal sinuses and mastoids are stable and well pneumatized. Other: No scalp hematoma. Postoperative changes to both globes since the prior MRI. CT CERVICAL SPINE FINDINGS Alignment: Mild reversal of cervical lordosis. Mild degenerative appearing  anterolisthesis at C3-C4 and C4-C5. Cervicothoracic junction alignment is within normal limits. Bilateral posterior element alignment is within normal limits. Skull base and vertebrae: Visualized skull base is intact. No atlanto-occipital dissociation. No acute osseous abnormality identified. Soft tissues and spinal canal: No prevertebral fluid or swelling. No visible canal hematoma. Mild calcified carotid atherosclerosis. Disc levels: Advanced left side facet arthropathy. Disc and endplate degeneration maximal at C5-C6. However, mild if any cervical spinal stenosis. Upper chest: Visible upper thoracic levels appear intact. Negative lung apices. IMPRESSION: 1. No acute traumatic injury identified in the head or cervical spine. 2. Negative for age non contrast CT appearance of the brain. 3. Cervical spine degeneration. Electronically Signed   By: Genevie Ann M.D.   On: 07/09/2018 22:44   Ct Cervical Spine Wo Contrast  Result Date: 07/09/2018 CLINICAL DATA:  85 year old female status post fall  on anticoagulation. EXAM: CT HEAD WITHOUT CONTRAST CT CERVICAL SPINE WITHOUT CONTRAST TECHNIQUE: Multidetector CT imaging of the head and cervical spine was performed following the standard protocol without intravenous contrast. Multiplanar CT image reconstructions of the cervical spine were also generated. COMPARISON:  Brain MRI 06/15/2011. FINDINGS: CT HEAD FINDINGS Brain: Stable cerebral volume. Chronic cerebral white matter changes appear similar to the prior MRI. No midline shift, ventriculomegaly, mass effect, evidence of mass lesion, intracranial hemorrhage or evidence of cortically based acute infarction. No cortical encephalomalacia identified. Vascular: Calcified atherosclerosis at the skull base. No suspicious intracranial vascular hyperdensity. Skull: No fracture identified. Sinuses/Orbits: Visualized paranasal sinuses and mastoids are stable and well pneumatized. Other: No scalp hematoma. Postoperative changes to  both globes since the prior MRI. CT CERVICAL SPINE FINDINGS Alignment: Mild reversal of cervical lordosis. Mild degenerative appearing anterolisthesis at C3-C4 and C4-C5. Cervicothoracic junction alignment is within normal limits. Bilateral posterior element alignment is within normal limits. Skull base and vertebrae: Visualized skull base is intact. No atlanto-occipital dissociation. No acute osseous abnormality identified. Soft tissues and spinal canal: No prevertebral fluid or swelling. No visible canal hematoma. Mild calcified carotid atherosclerosis. Disc levels: Advanced left side facet arthropathy. Disc and endplate degeneration maximal at C5-C6. However, mild if any cervical spinal stenosis. Upper chest: Visible upper thoracic levels appear intact. Negative lung apices. IMPRESSION: 1. No acute traumatic injury identified in the head or cervical spine. 2. Negative for age non contrast CT appearance of the brain. 3. Cervical spine degeneration. Electronically Signed   By: Genevie Ann M.D.   On: 07/09/2018 22:44   Pelvis Portable  Result Date: 07/10/2018 CLINICAL DATA:  ORIF for right femur fracture. EXAM: PORTABLE PELVIS 1-2 VIEWS COMPARISON:  Intraoperative films from earlier the same day. FINDINGS: Patient is status post dynamic hip screw placement for right femoral neck fracture. No evidence for immediate hardware complications. Gas in the overlying soft tissues is compatible the immediate postoperative state. IMPRESSION: Status post ORIF for right femoral neck fracture. No evidence for immediate hardware complications. Electronically Signed   By: Misty Stanley M.D.   On: 07/10/2018 17:46   Dg Chest Port 1 View  Result Date: 07/10/2018 CLINICAL DATA:  Preop EXAM: PORTABLE CHEST 1 VIEW COMPARISON:  01/16/2018 FINDINGS: Heart and mediastinal contours are within normal limits. No focal opacities or effusions. No acute bony abnormality. IMPRESSION: No active disease. Electronically Signed   By: Rolm Baptise  M.D.   On: 07/10/2018 00:41   Dg Knee Right Port  Result Date: 07/09/2018 CLINICAL DATA:  Displaced intertrochanteric fracture EXAM: PORTABLE RIGHT KNEE - 1-2 VIEW COMPARISON:  None. FINDINGS: Moderate tricompartment degenerative changes with joint space narrowing and spurring, most pronounced in the medial compartment. No acute bony abnormality. Specifically, no fracture, subluxation, or dislocation. No joint effusion. IMPRESSION: Degenerative changes.  No acute bony abnormality. Electronically Signed   By: Rolm Baptise M.D.   On: 07/09/2018 21:44   Dg C-arm 1-60 Min-no Report  Result Date: 07/10/2018 Fluoroscopy was utilized by the requesting physician.  No radiographic interpretation.   Dg Hip Operative Unilat W Or W/o Pelvis Right  Result Date: 07/10/2018 CLINICAL DATA:  Right intertrochanteric femur fracture ORIF. EXAM: OPERATIVE RIGHT HIP (WITH PELVIS IF PERFORMED) TECHNIQUE: Fluoroscopic spot image(s) were submitted for interpretation post-operatively. Fluoroscopy time is 39 seconds. COMPARISON:  Right hip x-rays from same day. FINDINGS: Multiple intraoperative fluoroscopic images demonstrate interval gamma nail fixation of the right intertrochanteric femur fracture, in near anatomic alignment. The lesser trochanter fragment  remains displaced. IMPRESSION: 1. Intraoperative fluoroscopic guidance for right intertrochanteric femur fracture ORIF. Electronically Signed   By: Titus Dubin M.D.   On: 07/10/2018 14:27   Dg Hip Unilat With Pelvis 2-3 Views Right  Result Date: 07/09/2018 CLINICAL DATA:  85 year old female status post fall at home with pain. EXAM: DG HIP (WITH OR WITHOUT PELVIS) 2-3V RIGHT COMPARISON:  Pelvis radiograph 03/16/2015. FINDINGS: Comminuted intertrochanteric fracture of the right femur with varus impaction. Mild displacement of the lesser trochanter butterfly fragment. The right femoral head remains normally located. No fracture of the pelvis identified. Grossly intact  proximal left femur. Negative visible bowel gas pattern. Partially visible lumbar vertebral augmentation. IMPRESSION: Comminuted intertrochanteric fracture of the right femur with varus impaction. Electronically Signed   By: Genevie Ann M.D.   On: 07/09/2018 19:58        Scheduled Meds:  acetaminophen  1,000 mg Oral Q8H   docusate sodium  100 mg Oral BID   enoxaparin (LOVENOX) injection  40 mg Subcutaneous Q24H   senna  1 tablet Oral BID   Continuous Infusions:    LOS: 2 days    Time spent: over 30 min    Fayrene Helper, MD [facility] Pager AMION  If 7PM-7AM, please contact night-coverage [email or web address] Password [email or web address] 07/11/2018, 4:44 PM

## 2018-07-11 NOTE — NC FL2 (Signed)
[place] LEVEL OF CARE SCREENING TOOL     IDENTIFICATION  Patient Name: [name] Birthdate: 22-Nov-1922 Sex: female Admission Date (Current Location): 07/09/2018  [facility] and [place] Number:  Herbalist and Address:  [facility],  [place], [place]      Provider Number: [id number]  Attending Physician Name and Address:  Elodia Florence., *  Relative Name and Phone Number:       Current Level of Care: Hospital Recommended Level of Care: [place] Prior Approval Number:    Date Approved/Denied:   PASRR Number: [id number]  Discharge Plan: SNF    Current Diagnoses: Patient Active Problem List   Diagnosis Date Noted  . Closed intertrochanteric fracture of hip, right, initial encounter ([place])   . Essential hypertension 07/09/2018  . AKI (acute kidney injury) ([place]) 07/09/2018  . Closed right hip fracture ([place]) 07/09/2018  . History of breast cancer 11/27/2011    Orientation RESPIRATION BLADDER Height & Weight     Self, Time, Situation, Place  Normal Continent Weight: 71.4 kg Height:  5\' 3"  (160 cm)  BEHAVIORAL SYMPTOMS/MOOD NEUROLOGICAL BOWEL NUTRITION STATUS      Continent Diet  AMBULATORY STATUS COMMUNICATION OF NEEDS Skin   Extensive Assist Verbally Normal                       Personal Care Assistance Level of Assistance  Bathing, Dressing, Feeding Bathing Assistance: Maximum assistance Feeding assistance: Limited assistance Dressing Assistance: Maximum assistance     Functional Limitations Info  Sight Sight Info: Impaired        SPECIAL CARE FACTORS FREQUENCY  PT (By licensed PT), OT (By licensed OT)     PT Frequency: 5 x weekly OT Frequency: 5 x weekly            Contractures Contractures Info: Not present    Additional Factors Info  Code Status, Allergies Code Status Info: DNR Allergies Info: Macrobid           Current Medications (07/11/2018):   This is the current hospital active medication list Current Facility-Administered Medications  Medication Dose Route Frequency Provider Last Rate Last Dose  . acetaminophen (TYLENOL) tablet 1,000 mg  1,000 mg Oral Q8H Elodia Florence., MD   1,000 mg at 07/11/18 0940  . docusate sodium (COLACE) capsule 100 mg  100 mg Oral BID Rod Can, MD   100 mg at 07/11/18 3790  . enoxaparin (LOVENOX) injection 40 mg  40 mg Subcutaneous Q24H Rod Can, MD   40 mg at 07/11/18 2409  . menthol-cetylpyridinium (CEPACOL) lozenge 3 mg  1 lozenge Oral PRN Swinteck, Aaron Edelman, MD       Or  . phenol (CHLORASEPTIC) mouth spray 1 spray  1 spray Mouth/Throat PRN Swinteck, Aaron Edelman, MD      . metoCLOPramide (REGLAN) tablet 5-10 mg  5-10 mg Oral Q8H PRN Swinteck, Aaron Edelman, MD       Or  . metoCLOPramide (REGLAN) injection 5-10 mg  5-10 mg Intravenous Q8H PRN Swinteck, Aaron Edelman, MD      . ondansetron (ZOFRAN) tablet 4 mg  4 mg Oral Q6H PRN Swinteck, Aaron Edelman, MD       Or  . ondansetron (ZOFRAN) injection 4 mg  4 mg Intravenous Q6H PRN Swinteck, Aaron Edelman, MD      . oxyCODONE (Oxy IR/ROXICODONE) immediate release tablet 2.5 mg  2.5 mg Oral Q4H PRN Rod Can, MD   2.5  mg at 07/10/18 1811   Or  . oxyCODONE (Oxy IR/ROXICODONE) immediate release tablet 5 mg  5 mg Oral Q4H PRN Swinteck, Aaron Edelman, MD      . senna (SENOKOT) tablet 8.6 mg  1 tablet Oral BID Rod Can, MD   8.6 mg at 07/11/18 5498     Discharge Medications: Please see discharge summary for a list of discharge medications.  Relevant Imaging Results:  Relevant Lab Results:   Additional Information SS# [id number]  Nini Cavan, Marjie Skiff, RN

## 2018-07-11 NOTE — Progress Notes (Signed)
    Subjective:  Patient reports pain as mild to moderate.  Denies N/V/CP/SOB. No c/o.  Objective:   VITALS:   Vitals:   07/10/18 1953 07/10/18 2133 07/11/18 0056 07/11/18 0459  BP: (!) 135/56 126/66 (!) 155/72 (!) 152/75  Pulse: 77 80 81 81  Resp: 16 16 16 16   Temp: 98.4 F (36.9 C) 98.5 F (36.9 C) 98.5 F (36.9 C) 98.4 F (36.9 C)  TempSrc:   Oral   SpO2: 97% 98% 97% 99%  Weight:      Height:        NAD ABD soft Sensation intact distally Intact pulses distally Dorsiflexion/Plantar flexion intact Incision: dressing C/D/I Compartment soft   Lab Results  Component Value Date   WBC 10.6 (H) 07/11/2018   HGB 9.5 (L) 07/11/2018   HCT 29.9 (L) 07/11/2018   MCV 106.4 (H) 07/11/2018   PLT 219 07/11/2018   BMET    Component Value Date/Time   NA 138 07/11/2018 0254   NA 140 09/01/2012 1008   K 4.7 07/11/2018 0254   K 4.7 09/01/2012 1008   CL 109 07/11/2018 0254   CL 108 (H) 11/28/2011 1030   CO2 22 07/11/2018 0254   CO2 24 09/01/2012 1008   GLUCOSE 154 (H) 07/11/2018 0254   GLUCOSE 82 09/01/2012 1008   GLUCOSE 73 11/28/2011 1030   BUN 16 07/11/2018 0254   BUN 21.0 09/01/2012 1008   CREATININE 0.86 07/11/2018 0254   CREATININE 1.0 09/01/2012 1008   CALCIUM 8.1 (L) 07/11/2018 0254   CALCIUM 9.2 09/01/2012 1008   GFRNONAA 58 (L) 07/11/2018 0254   GFRAA >60 07/11/2018 0254     Assessment/Plan: 1 Day Post-Op   Active Problems:   History of breast cancer   Essential hypertension   AKI (acute kidney injury) (HCC)   Closed right hip fracture (HCC)   Closed intertrochanteric fracture of hip, right, initial encounter ([place])   WBAT with walker DVT ppx: Lovenox in house --> d/c on ASA 81 mg PO BID, SCDs, TEDS PO pain control PT/OT Dispo: [facility] SNF placement   Hilton Cork Jvion Turgeon 07/11/2018, 8:31 AM   Rod Can, MD Cell: [phone] [place] [facility] now [facility]  [facility] [address], [place], [place], [place]  [address] Phone: [phone] [email or web address] Facebook  [employer]

## 2018-07-11 NOTE — Care Management Important Message (Signed)
Important Message  Patient Details  Name: [name] MRN: [id number] Date of Birth: Nov 19, 1922   Medicare Important Message Given:  Yes. CMA printed out patient's IM for the CSW or Case Manager to give to the patient.      Teyonna Plaisted 07/11/2018, 8:11 AM

## 2018-07-11 NOTE — Progress Notes (Signed)
SLP Cancellation Note  Patient Details Name: [name] MRN: [id number] DOB: 1922-09-26   Cancelled treatment:       Reason Eval/Treat Not Completed: SLP screened, no needs identified, will sign off. No indications of swallow concerns in chart. Attending MD also did not have any concerns. SLP to s/o, patient is tolerating regular, thin liquids without difficulty. Thank you!   [name] [name] 07/11/2018, 12:25 PM   Sonia Baller, MA, [place] [facility] Pager: [phone]

## 2018-07-11 NOTE — Evaluation (Signed)
Physical Therapy Evaluation Patient Details Name: [name] MRN: [id number] DOB: 1922-08-13 Today's Date: 07/11/2018   History of Present Illness  85 yo female admitted after fall resulting in right hip fx. s/p IM nail per Dr. Lyla Glassing on 07/10/18. PMHx:RUE birth (brachial plexus) injury  Clinical Impression  Pt admitted with above diagnosis. Pt currently with functional limitations due to the deficits listed below (see PT Problem List).  Pt requiring +2 assist mod/max assist  for basic transfers. Will benefit from SNF post acute   Pt will benefit from skilled PT to increase their independence and safety with mobility to allow discharge to the venue listed below.       Follow Up Recommendations SNF    Equipment Recommendations  None recommended by PT    Recommendations for Other Services       Precautions / Restrictions Precautions Precautions: Fall Restrictions Weight Bearing Restrictions: No      Mobility  Bed Mobility Overal bed mobility: Needs Assistance Bed Mobility: Supine to Sit     Supine to sit: Mod assist     General bed mobility comments: assist with RLE and trunk  Transfers Overall transfer level: Needs assistance Equipment used: Rolling walker (2 wheeled) Transfers: Sit to/from [facility] Sit to Stand: Mod assist;Max assist;+2 physical assistance;+2 safety/equipment Stand pivot transfers: Mod assist;Max assist;+2 physical assistance;+2 safety/equipment       General transfer comment: assist to rise and stabilize, cues for hand placement, safety, use of UEs  Ambulation/Gait             General Gait Details: difficulty with wt shifting to RLE to advance RLE  Stairs            Wheelchair Mobility    Modified Rankin (Stroke Patients Only)       Balance Overall balance assessment: Needs assistance;History of Falls Sitting-balance support: Feet supported;No upper extremity supported Sitting balance-Leahy Scale:  Fair       Standing balance-Leahy Scale: Poor Standing balance comment: heavily reliant on UEs and needing external support                             Pertinent Vitals/Pain Pain Assessment: Faces Faces Pain Scale: Hurts even more Pain Location: right hip Pain Descriptors / Indicators: Grimacing;Sore Pain Intervention(s): Premedicated before session;Monitored during session    [place] expects to be discharged to:: Skilled nursing facility Living Arrangements: Spouse/significant other             Home Equipment: Walker - 4 wheels;Walker - 2 wheels Additional Comments: pt  is from [place] [facility]    Prior Function Level of Independence: Independent with assistive device(s)         Comments: amb with rollator     Hand Dominance        Extremity/Trunk Assessment   Upper Extremity Assessment Upper Extremity Assessment: RUE deficits/detail RUE Deficits / Details: baseline limitations d/t remote injury    Lower Extremity Assessment Lower Extremity Assessment: RLE deficits/detail RLE Deficits / Details: ankle WFL, knee and hip limited by pain and post op weakness       Communication   Communication: No difficulties  Cognition Arousal/Alertness: Awake/alert Behavior During Therapy: WFL for tasks assessed/performed Overall Cognitive Status: Within Functional Limits for tasks assessed  General Comments      Exercises     Assessment/Plan    PT Assessment Patient needs continued PT services  PT Problem List Decreased strength;Decreased range of motion;Decreased mobility;Decreased activity tolerance;Pain;Decreased balance;Decreased knowledge of use of DME;Decreased safety awareness       PT Treatment Interventions DME instruction;Gait training;Functional mobility training;Therapeutic activities;Therapeutic exercise;Patient/family education    PT Goals (Current goals  can be found in the Care Plan section)  Acute Rehab PT Goals PT Goal Formulation: With patient Time For Goal Achievement: 07/25/18 Potential to Achieve Goals: Good    Frequency Min 3X/week   Barriers to discharge        Co-evaluation               AM-PAC PT "6 Clicks" Mobility  Outcome Measure Help needed turning from your back to your side while in a flat bed without using bedrails?: A Lot Help needed moving from lying on your back to sitting on the side of a flat bed without using bedrails?: A Lot Help needed moving to and from a bed to a chair (including a wheelchair)?: A Lot Help needed standing up from a chair using your arms (e.g., wheelchair or bedside chair)?: A Lot Help needed to walk in hospital room?: Total Help needed climbing 3-5 steps with a railing? : Total 6 Click Score: 10    End of Session Equipment Utilized During Treatment: Gait belt Activity Tolerance: Patient tolerated treatment well Patient left: in chair;with call bell/phone within reach Nurse Communication: Mobility status PT Visit Diagnosis: Difficulty in walking, not elsewhere classified (R26.2)    Time: 3149-7026 PT Time Calculation (min) (ACUTE ONLY): 30 min   Charges:   PT Evaluation $PT Eval Low Complexity: 1 Low PT Treatments $Therapeutic Activity: 8-22 mins        Kenyon Ana, PT  Pager: [phone] Acute Rehab Dept [facility]): [phone]   07/11/2018   [facility] 07/11/2018, 1:29 PM

## 2018-07-12 LAB — CBC
HCT: 28.1 % — ABNORMAL LOW (ref 36.0–46.0)
Hemoglobin: 9.1 g/dL — ABNORMAL LOW (ref 12.0–15.0)
MCH: 34.6 pg — ABNORMAL HIGH (ref 26.0–34.0)
MCHC: 32.4 g/dL (ref 30.0–36.0)
MCV: 106.8 fL — ABNORMAL HIGH (ref 80.0–100.0)
Platelets: 237 10*3/uL (ref 150–400)
RBC: 2.63 MIL/uL — ABNORMAL LOW (ref 3.87–5.11)
RDW: 13.6 % (ref 11.5–15.5)
WBC: 10.4 10*3/uL (ref 4.0–10.5)
nRBC: 0 % (ref 0.0–0.2)

## 2018-07-12 LAB — COMPREHENSIVE METABOLIC PANEL
ALT: 13 U/L (ref 0–44)
AST: 27 U/L (ref 15–41)
Albumin: 3.2 g/dL — ABNORMAL LOW (ref 3.5–5.0)
Alkaline Phosphatase: 47 U/L (ref 38–126)
Anion gap: 8 (ref 5–15)
BUN: 20 mg/dL (ref 8–23)
CO2: 22 mmol/L (ref 22–32)
Calcium: 8.5 mg/dL — ABNORMAL LOW (ref 8.9–10.3)
Chloride: 109 mmol/L (ref 98–111)
Creatinine, Ser: 0.83 mg/dL (ref 0.44–1.00)
GFR calc Af Amer: >60 mL/min (ref >60)
GFR calc non Af Amer: >60 mL/min (ref >60)
Glucose, Bld: 105 mg/dL — ABNORMAL HIGH (ref 70–99)
Potassium: 4.5 mmol/L (ref 3.5–5.1)
Sodium: 139 mmol/L (ref 135–145)
Total Bilirubin: 0.3 mg/dL (ref 0.3–1.2)
Total Protein: 5.9 g/dL — ABNORMAL LOW (ref 6.5–8.1)

## 2018-07-12 NOTE — Progress Notes (Signed)
Subjective: 2 Days Post-Op Procedure(s) (LRB): INTRAMEDULLARY (IM) NAIL INTERTROCHANTRIC (Right)  Patient reports pain as mild to moderate.  Resting comfortably in bed.  Tolerating POs well.  Admits to flatus.  Denies fever, chills, N/V, CP, SOB.  Reports that she worked with therapy yesterday, but that it was tougher than she expected.   Objective:   VITALS:  Temp:  [97.7 F (36.5 C)-98 F (36.7 C)] 97.7 F (36.5 C) (07/04 0615) Pulse Rate:  [77-89] 77 (07/04 0615) Resp:  [16] 16 (07/04 0615) BP: (124-158)/(64-97) 145/75 (07/04 0615) SpO2:  [97 %-99 %] 97 % (07/04 0615)  General: WDWN patient in NAD. Psych:  Appropriate mood and affect. Neuro:  A&O x 3, Moving all extremities, sensation intact to light touch HEENT:  EOMs intact Chest:  Even non-labored respirations Skin:  Dressing C/D/I, no rashes or lesions Extremities: warm/dry, mild edema, no erythema or echymosis.  No lymphadenopathy. Pulses: Popliteus 2+ MSK:  ROM: TKE, MMT: able to perform quad set, (-) Homan's   LABS Recent Labs    07/09/18 1852 07/10/18 0412 07/11/18 0254 07/12/18 0258  HGB 11.6* 10.7* 9.5* 9.1*  WBC 8.5 11.6* 10.6* 10.4  PLT 271 224 219 237   Recent Labs    07/11/18 0254 07/12/18 0258  NA 138 139  K 4.7 4.5  CL 109 109  CO2 22 22  BUN 16 20  CREATININE 0.86 0.83  GLUCOSE 154* 105*   Recent Labs    07/09/18 1852  INR 1.3*     Assessment/Plan: 2 Days Post-Op Procedure(s) (LRB): INTRAMEDULLARY (IM) NAIL INTERTROCHANTRIC (Right)  Patient seen in rounds for Dr. Areta Haber RLE with rolling walker DVT prophylaxis: lovenox with transition to 81mg  ASA BID upon D/C Up with therapy Disp: [facility] placement when ready Plan for outpatient post-op visit with Dr. Riley Lam PA-C [facility] Office:  [phone]

## 2018-07-12 NOTE — Progress Notes (Signed)
Physical Therapy Treatment Patient Details Name: [name] MRN: [id number] DOB: May 06, 1922 Today's Date: 07/12/2018    History of Present Illness 85 yo female admitted after fall resulting in right hip fx. s/p IM nail per Dr. Lyla Glassing on 07/10/18. PMHx:RUE birth (brachial plexus) injury    PT Comments    POD # 2  Assisted OOB.  General bed mobility comments: assist with RLE and trunk with increased time and use of bed pad to complete scooting.  General transfer comment: + 2 side by side assist and 75% VC's on proper hand placement  from elevated bed and on/off BSC.General Gait Details: difficulty with wt shifting to RLE to advance RLE   Much side by side assist and recliner following.  Only able to advance gait to 4 feet.   Pt will need ST Rehab at SNF prior to returning home.    Follow Up Recommendations  SNF     Equipment Recommendations  None recommended by PT    Recommendations for Other Services       Precautions / Restrictions Precautions Precautions: Fall Restrictions Weight Bearing Restrictions: No Other Position/Activity Restrictions: WBAT    Mobility  Bed Mobility Overal bed mobility: Needs Assistance Bed Mobility: Supine to Sit     Supine to sit: Mod assist     General bed mobility comments: assist with RLE and trunk with increased time and use of bed pad to complete scooting  Transfers Overall transfer level: Needs assistance Equipment used: Rolling walker (2 wheeled) Transfers: Sit to/from [facility] Sit to Stand: Mod assist;Max assist;+2 physical assistance;+2 safety/equipment Stand pivot transfers: Max assist;+2 physical assistance;+2 safety/equipment       General transfer comment: + 2 side by side assist and 75% VC's on proper hand placement  from elevated bed and on/off BSC.  Ambulation/Gait Ambulation/Gait assistance: Max assist;+2 physical assistance;+2 safety/equipment Gait Distance (Feet): 3 Feet Assistive device:  Rolling walker (2 wheeled) Gait Pattern/deviations: Step-to pattern;Decreased step length - right;Decreased step length - left;Decreased stance time - right Gait velocity: decreased   General Gait Details: difficulty with wt shifting to RLE to advance RLE   Much side by side assist and recliner following   Stairs             Wheelchair Mobility    Modified Rankin (Stroke Patients Only)       Balance                                            Cognition Arousal/Alertness: Awake/alert Behavior During Therapy: WFL for tasks assessed/performed Overall Cognitive Status: Within Functional Limits for tasks assessed                                 General Comments: required repeat instruction but AxO x 3      Exercises      General Comments        Pertinent Vitals/Pain Pain Assessment: Faces Faces Pain Scale: Hurts even more Pain Location: right hip Pain Descriptors / Indicators: Grimacing;Sore Pain Intervention(s): Premedicated before session;Monitored during session;Repositioned    Home Living                      Prior Function            PT Goals (current goals can  now be found in the care plan section) Progress towards PT goals: Progressing toward goals    Frequency    Min 3X/week      PT Plan      Co-evaluation              AM-PAC PT "6 Clicks" Mobility   Outcome Measure  Help needed turning from your back to your side while in a flat bed without using bedrails?: A Lot Help needed moving from lying on your back to sitting on the side of a flat bed without using bedrails?: A Lot Help needed moving to and from a bed to a chair (including a wheelchair)?: A Lot Help needed standing up from a chair using your arms (e.g., wheelchair or bedside chair)?: A Lot Help needed to walk in hospital room?: Total Help needed climbing 3-5 steps with a railing? : Total 6 Click Score: 10    End of Session  Equipment Utilized During Treatment: Gait belt Activity Tolerance: Patient limited by fatigue;Patient limited by pain Patient left: in chair;with call bell/phone within reach Nurse Communication: Mobility status PT Visit Diagnosis: Difficulty in walking, not elsewhere classified (R26.2)     Time: 1037-1100 PT Time Calculation (min) (ACUTE ONLY): 23 min  Charges:  $Gait Training: 8-22 mins $Therapeutic Activity: 8-22 mins                     Rica Koyanagi  PTA Acute  Rehabilitation Services Pager      [phone] Office      [phone]

## 2018-07-12 NOTE — Plan of Care (Signed)

## 2018-07-12 NOTE — Progress Notes (Addendum)
PROGRESS NOTE    [name]  ZDG:[id number] DOB: 08/14/1922 DOA: 07/09/2018 PCP: Lajean Manes, MD   Brief Narrative:  85 yo with hx of HTN, breast cancer who presented after [name] mechanical fall with R femur fracture.   Assessment & Plan:   Active Problems:   History of breast cancer   Essential hypertension   AKI (acute kidney injury) ([place])   Closed right hip fracture (HCC)   Closed intertrochanteric fracture of hip, right, initial encounter [name])   Right hip fracture  Mechanical Fall:  Imaging with intertrochanteric fracture of the R femur with varus impaction Knee plain films negative Head and c spine CT without acute findings S/p intramedullary fixation of R femur on 7/2 Start vitamin D supplementation with deficiency WBAT RLE with rolling walker Plan for lovenox while in house and d/c on ASA for DVT ppx D/c pending SNF  HTN: holding lasix, continue amlodipine  AKI: baseline creatinine appears to be ~1, creatinine was 1.36 at presentation.  Improved.  Pyuria: follow urine culture -> no growth  LLE wound: wound care c/s, appreciate recommendations.    DVT prophylaxis: SCD Code Status: DNR Family Communication: none at bedside  Disposition Plan: pending OR and clearance by ortho   Consultants:   orthopedics  Procedures:   none  Antimicrobials:  Anti-infectives (From admission, onward)   Start     Dose/Rate Route Frequency Ordered Stop   07/10/18 1800  ceFAZolin (ANCEF) IVPB 2g/100 mL premix     2 g 200 mL/hr over 30 Minutes Intravenous Every 6 hours 07/10/18 1645 07/11/18 0034   07/10/18 0600  ceFAZolin (ANCEF) IVPB 2g/100 mL premix     2 g 200 mL/hr over 30 Minutes Intravenous On call to O.R. 07/10/18 0125 07/10/18 1315     Subjective: Doing ok today, no complaints  Objective: Vitals:   07/11/18 1000 07/11/18 1333 07/11/18 2107 07/12/18 0615  BP: 137/64 (!) 124/97 (!) 158/67 (!) 145/75  Pulse: 89 86 88 77  Resp: 16 16 16 16   Temp: 98 F  (36.7 C) 97.8 F (36.6 C) 97.7 F (36.5 C) 97.7 F (36.5 C)  TempSrc:   Oral Oral  SpO2: 98% 99% 98% 97%  Weight:      Height:        Intake/Output Summary (Last 24 hours) at 07/12/2018 1228 Last data filed at 07/12/2018 0910 Gross per 24 hour  Intake 1440 ml  Output 1630 ml  Net -190 ml   Filed Weights   07/09/18 1808 07/10/18 0124  Weight: 71.2 kg 71.4 kg    Examination:  General: No acute distress. Cardiovascular: Heart sounds show [name] regular rate, and rhythm.  Lungs: Clear to auscultation bilaterally  Abdomen: Soft, nontender, nondistended  Neurological: Alert and oriented 3. Moves all extremities 4. Cranial nerves II through XII grossly intact. Skin: Warm and dry. No rashes or lesions. Extremities: RLE with dressing intact.  Dressing intact to LLE      Data Reviewed: I have personally reviewed following labs and imaging studies  CBC: Recent Labs  Lab 07/09/18 1852 07/10/18 0412 07/11/18 0254 07/12/18 0258  WBC 8.5 11.6* 10.6* 10.4  NEUTROABS 6.0  --   --   --   HGB 11.6* 10.7* 9.5* 9.1*  HCT 35.6* 33.1* 29.9* 28.1*  MCV 105.6* 106.1* 106.4* 106.8*  PLT 271 224 219 951   Basic Metabolic Panel: Recent Labs  Lab 07/09/18 1852 07/10/18 0412 07/11/18 0254 07/12/18 0258  NA 137 138 138 139  K 4.5  4.7 4.7 4.5  CL 106 109 109 109  CO2 25 21* 22 22  GLUCOSE 133* 227* 154* 105*  BUN 35* 28* 16 20  CREATININE 1.36* 1.12* 0.86 0.83  CALCIUM 8.7* 8.4* 8.1* 8.5*   GFR: Estimated Creatinine Clearance: 39.3 mL/min (by C-G formula based on SCr of 0.83 mg/dL). Liver Function Tests: Recent Labs  Lab 07/10/18 0412 07/12/18 0258  AST  --  27  ALT  --  13  ALKPHOS  --  47  BILITOT  --  0.3  PROT  --  5.9*  ALBUMIN 3.6 3.2*   No results for input(s): LIPASE, AMYLASE in the last 168 hours. No results for input(s): AMMONIA in the last 168 hours. Coagulation Profile: Recent Labs  Lab 07/09/18 1852  INR 1.3*   Cardiac Enzymes: No results for  input(s): CKTOTAL, CKMB, CKMBINDEX, TROPONINI in the last 168 hours. BNP (last 3 results) No results for input(s): PROBNP in the last 8760 hours. HbA1C: No results for input(s): HGBA1C in the last 72 hours. CBG: No results for input(s): GLUCAP in the last 168 hours. Lipid Profile: No results for input(s): CHOL, HDL, LDLCALC, TRIG, CHOLHDL, LDLDIRECT in the last 72 hours. Thyroid Function Tests: No results for input(s): TSH, T4TOTAL, FREET4, T3FREE, THYROIDAB in the last 72 hours. Anemia Panel: No results for input(s): VITAMINB12, FOLATE, FERRITIN, TIBC, IRON, RETICCTPCT in the last 72 hours. Sepsis Labs: No results for input(s): PROCALCITON, LATICACIDVEN in the last 168 hours.  Recent Results (from the past 240 hour(s))  SARS Coronavirus 2 (CEPHEID - Performed in [facility] hospital lab), Hosp Order     Status: None   Collection Time: 07/09/18  8:10 PM   Specimen: Nasopharyngeal Swab  Result Value Ref Range Status   SARS Coronavirus 2 NEGATIVE NEGATIVE Final    Comment: (NOTE) If result is NEGATIVE SARS-CoV-2 target nucleic acids are NOT DETECTED. The SARS-CoV-2 RNA is generally detectable in upper and lower  respiratory specimens during the acute phase of infection. The lowest  concentration of SARS-CoV-2 viral copies this assay can detect is 250  copies / mL. [name] negative result does not preclude SARS-CoV-2 infection  and should not be used as the sole basis for treatment or other  patient management decisions.  [name] negative result may occur with  improper specimen collection / handling, submission of specimen other  than nasopharyngeal swab, presence of viral mutation(s) within the  areas targeted by this assay, and inadequate number of viral copies  (<250 copies / mL). [name] negative result must be combined with clinical  observations, patient history, and epidemiological information. If result is POSITIVE SARS-CoV-2 target nucleic acids are DETECTED. The SARS-CoV-2 RNA is  generally detectable in upper and lower  respiratory specimens dur ing the acute phase of infection.  Positive  results are indicative of active infection with SARS-CoV-2.  Clinical  correlation with patient history and other diagnostic information is  necessary to determine patient infection status.  Positive results do  not rule out bacterial infection or co-infection with other viruses. If result is PRESUMPTIVE POSTIVE SARS-CoV-2 nucleic acids MAY BE PRESENT.   [name] presumptive positive result was obtained on the submitted specimen  and confirmed on repeat testing.  While 2019 novel coronavirus  (SARS-CoV-2) nucleic acids may be present in the submitted sample  additional confirmatory testing may be necessary for epidemiological  and / or clinical management purposes  to differentiate between  SARS-CoV-2 and other Sarbecovirus currently known to infect humans.  If clinically  indicated additional testing with an alternate test  methodology [id number]) is advised. The SARS-CoV-2 RNA is generally  detectable in upper and lower respiratory sp ecimens during the acute  phase of infection. The expected result is Negative. Fact Sheet for Patients:  StrictlyIdeas.no Fact Sheet for Healthcare Providers: BankingDealers.co.za This test is not yet approved or cleared by the [place] FDA and has been authorized for detection and/or diagnosis of SARS-CoV-2 by FDA under an Emergency Use Authorization (EUA).  This EUA will remain in effect (meaning this test can be used) for the duration of the COVID-19 declaration under Section 564(b)(1) of the Act, 21 U.S.C. section 360bbb-3(b)(1), unless the authorization is terminated or revoked sooner. Performed at [facility], [place] [address], [place], [place] [address]   MRSA PCR Screening     Status: None   Collection Time: 07/10/18  1:56 AM   Specimen: Nasal Mucosa; Nasopharyngeal   Result Value Ref Range Status   MRSA by PCR NEGATIVE NEGATIVE Final    Comment:        The GeneXpert MRSA Assay (FDA approved for NASAL specimens only), is one component of [name] comprehensive MRSA colonization surveillance program. It is not intended to diagnose MRSA infection nor to guide or monitor treatment for MRSA infections. Performed at [facility], [place] [address] [place], [place] [address]   Culture, Urine     Status: None   Collection Time: 07/10/18  6:28 AM   Specimen: Urine, Clean Catch  Result Value Ref Range Status   Specimen Description   Final    URINE, CLEAN CATCH Performed at [facility], [place] [address], [place], [place] [address]    Special Requests   Final    NONE Performed at [facility], [place] [address], [place], [place] [address]    Culture   Final    NO GROWTH Performed at [facility], [place] [address] [place], [place] [address]    Report Status 07/11/2018 FINAL  Final         Radiology Studies: Pelvis Portable  Result Date: 07/10/2018 CLINICAL DATA:  ORIF for right femur fracture. EXAM: PORTABLE PELVIS 1-2 VIEWS COMPARISON:  Intraoperative films from earlier the same day. FINDINGS: Patient is status post dynamic hip screw placement for right femoral neck fracture. No evidence for immediate hardware complications. Gas in the overlying soft tissues is compatible the immediate postoperative state. IMPRESSION: Status post ORIF for right femoral neck fracture. No evidence for immediate hardware complications. Electronically Signed   By: Misty Stanley M.D.   On: 07/10/2018 17:46   Dg C-arm 1-60 Min-no Report  Result Date: 07/10/2018 Fluoroscopy was utilized by the requesting physician.  No radiographic interpretation.   Dg Hip Operative Unilat W Or W/o Pelvis Right  Result Date: 07/10/2018 CLINICAL DATA:  Right intertrochanteric femur fracture ORIF. EXAM: OPERATIVE RIGHT HIP (WITH PELVIS IF  PERFORMED) TECHNIQUE: Fluoroscopic spot image(s) were submitted for interpretation post-operatively. Fluoroscopy time is 39 seconds. COMPARISON:  Right hip x-rays from same day. FINDINGS: Multiple intraoperative fluoroscopic images demonstrate interval gamma nail fixation of the right intertrochanteric femur fracture, in near anatomic alignment. The lesser trochanter fragment remains displaced. IMPRESSION: 1. Intraoperative fluoroscopic guidance for right intertrochanteric femur fracture ORIF. Electronically Signed   By: Titus Dubin M.D.   On: 07/10/2018 14:27        Scheduled Meds: . acetaminophen  1,000 mg Oral Q8H  . cholecalciferol  1,000 Units Oral Daily  .  docusate sodium  100 mg Oral BID  . enoxaparin (LOVENOX) injection  40 mg Subcutaneous Q24H  . senna  1 tablet Oral BID   Continuous Infusions:    LOS: 3 days    Time spent: over 30 min    Fayrene Helper, MD [facility] Pager AMION  If 7PM-7AM, please contact night-coverage [email or web address] Password [facility] 07/12/2018, 12:28 PM

## 2018-07-12 NOTE — Progress Notes (Signed)
Physical Therapy Treatment Patient Details Name: [name] MRN: [id number] DOB: 12-08-1922 Today's Date: 07/12/2018    History of Present Illness 85 yo female admitted after fall resulting in right hip fx. s/p IM nail per Dr. Lyla Glassing on 07/10/18. PMHx:RUE birth (brachial plexus) injury    PT Comments    POD # 2 pm session Assisted back to bed.  General transfer comment: + 2 side by side assist and 75% VC's on proper hand placement  from recliner to bed with difficulty weight shifting and difficulty supporting self upright..  Sit to supine: +2 for physical assistance;+2 for safety/equipment;Max assist.  Positioned to comfort.   Pt progressing slowly and will need ST Rehab at SNF.   Follow Up Recommendations  SNF     Equipment Recommendations  None recommended by PT    Recommendations for Other Services       Precautions / Restrictions Precautions Precautions: Fall Restrictions Weight Bearing Restrictions: No Other Position/Activity Restrictions: WBAT    Mobility  Bed Mobility Overal bed mobility: Needs Assistance Bed Mobility: Sit to Supine     Supine to sit: Mod assist Sit to supine: +2 for physical assistance;+2 for safety/equipment;Max assist   General bed mobility comments: assisted back to bed  Transfers Overall transfer level: Needs assistance Equipment used: Rolling walker (2 wheeled) Transfers: Sit to/from [facility] Sit to Stand: Mod assist;Max assist;+2 physical assistance;+2 safety/equipment Stand pivot transfers: Max assist;+2 physical assistance;+2 safety/equipment       General transfer comment: + 2 side by side assist and 75% VC's on proper hand placement  from recliner to bed with difficulty weight shifting and difficulty supporting self upright.  Ambulation/Gait Ambulation/Gait assistance: Max assist;+2 physical assistance;+2 safety/equipment Gait Distance (Feet): 3 Feet Assistive device: Rolling walker (2 wheeled) Gait  Pattern/deviations: Step-to pattern;Decreased step length - right;Decreased step length - left;Decreased stance time - right Gait velocity: decreased   General Gait Details: assisted back to bed a few pivot steps   Stairs             Wheelchair Mobility    Modified Rankin (Stroke Patients Only)       Balance                                            Cognition Arousal/Alertness: Awake/alert Behavior During Therapy: WFL for tasks assessed/performed Overall Cognitive Status: Within Functional Limits for tasks assessed                                 General Comments: required repeat instruction but AxO x 3      Exercises      General Comments        Pertinent Vitals/Pain Pain Assessment: Faces Faces Pain Scale: Hurts even more Pain Location: right hip Pain Descriptors / Indicators: Grimacing;Sore Pain Intervention(s): Premedicated before session;Monitored during session;Repositioned    Home Living                      Prior Function            PT Goals (current goals can now be found in the care plan section) Progress towards PT goals: Progressing toward goals    Frequency    Min 3X/week      PT Plan  Co-evaluation              AM-PAC PT "6 Clicks" Mobility   Outcome Measure  Help needed turning from your back to your side while in a flat bed without using bedrails?: A Lot Help needed moving from lying on your back to sitting on the side of a flat bed without using bedrails?: A Lot Help needed moving to and from a bed to a chair (including a wheelchair)?: A Lot Help needed standing up from a chair using your arms (e.g., wheelchair or bedside chair)?: A Lot Help needed to walk in hospital room?: Total Help needed climbing 3-5 steps with a railing? : Total 6 Click Score: 10    End of Session Equipment Utilized During Treatment: Gait belt Activity Tolerance: Patient limited by  fatigue;Patient limited by pain Patient left: in chair;with call bell/phone within reach Nurse Communication: Mobility status PT Visit Diagnosis: Difficulty in walking, not elsewhere classified (R26.2)     Time: 8182-9937 PT Time Calculation (min) (ACUTE ONLY): 14 min  Charges:  $Gait Training: 8-22 mins $Therapeutic Activity: 8-22 mins                     Rica Koyanagi  PTA Acute  Rehabilitation Services Pager      [phone] Office      [phone]

## 2018-07-13 LAB — COMPREHENSIVE METABOLIC PANEL
ALT: 12 U/L (ref 0–44)
AST: 22 U/L (ref 15–41)
Albumin: 2.9 g/dL — ABNORMAL LOW (ref 3.5–5.0)
Alkaline Phosphatase: 44 U/L (ref 38–126)
Anion gap: 6 (ref 5–15)
BUN: 15 mg/dL (ref 8–23)
CO2: 24 mmol/L (ref 22–32)
Calcium: 8.4 mg/dL — ABNORMAL LOW (ref 8.9–10.3)
Chloride: 111 mmol/L (ref 98–111)
Creatinine, Ser: 0.63 mg/dL (ref 0.44–1.00)
GFR calc Af Amer: >60 mL/min (ref >60)
GFR calc non Af Amer: >60 mL/min (ref >60)
Glucose, Bld: 102 mg/dL — ABNORMAL HIGH (ref 70–99)
Potassium: 4.5 mmol/L (ref 3.5–5.1)
Sodium: 141 mmol/L (ref 135–145)
Total Bilirubin: 0.8 mg/dL (ref 0.3–1.2)
Total Protein: 5.8 g/dL — ABNORMAL LOW (ref 6.5–8.1)

## 2018-07-13 LAB — CBC
HCT: 29.3 % — ABNORMAL LOW (ref 36.0–46.0)
Hemoglobin: 9.1 g/dL — ABNORMAL LOW (ref 12.0–15.0)
MCH: 33.7 pg (ref 26.0–34.0)
MCHC: 31.1 g/dL (ref 30.0–36.0)
MCV: 108.5 fL — ABNORMAL HIGH (ref 80.0–100.0)
Platelets: 254 10*3/uL (ref 150–400)
RBC: 2.7 MIL/uL — ABNORMAL LOW (ref 3.87–5.11)
RDW: 13.7 % (ref 11.5–15.5)
WBC: 8.1 10*3/uL (ref 4.0–10.5)
nRBC: 0 % (ref 0.0–0.2)

## 2018-07-13 LAB — MAGNESIUM: Magnesium: 2.2 mg/dL (ref 1.7–2.4)

## 2018-07-13 NOTE — Progress Notes (Signed)
   Subjective: 3 Days Post-Op Procedure(s) (LRB): INTRAMEDULLARY (IM) NAIL INTERTROCHANTRIC (Right) Patient reports pain as mild.   Patient seen in rounds for Dr. Lyla Glassing. Patient is well, and has had no acute complaints or problems. No acute events overnight. Patient states she had a big breakfast this morning. Denies CP/SHOB/calf pain. Foley catheter removed. She is eager to progress with therapy. We will continue therapy today.   Objective: Vital signs in last 24 hours: Temp:  [97.7 F (36.5 C)-98.5 F (36.9 C)] 97.7 F (36.5 C) (07/05 0557) Pulse Rate:  [84-98] 84 (07/05 0601) Resp:  [16] 16 (07/05 0557) BP: (139-155)/(53-61) 139/54 (07/05 0601) SpO2:  [96 %-99 %] 98 % (07/05 0601)  Intake/Output from previous day:  Intake/Output Summary (Last 24 hours) at 07/13/2018 1155 Last data filed at 07/13/2018 0820 Gross per 24 hour  Intake 1080 ml  Output 1800 ml  Net -720 ml     Intake/Output this shift: Total I/O In: 240 [P.O.:240] Out: 200 [Urine:200]  Labs: Recent Labs    07/11/18 0254 07/12/18 0258 07/13/18 0303  HGB 9.5* 9.1* 9.1*   Recent Labs    07/12/18 0258 07/13/18 0303  WBC 10.4 8.1  RBC 2.63* 2.70*  HCT 28.1* 29.3*  PLT 237 254   Recent Labs    07/12/18 0258 07/13/18 0303  NA 139 141  K 4.5 4.5  CL 109 111  CO2 22 24  BUN 20 15  CREATININE 0.83 0.63  GLUCOSE 105* 102*  CALCIUM 8.5* 8.4*   No results for input(s): LABPT, INR in the last 72 hours.  Exam: General - Patient is Alert and Oriented Extremity - Neurologically intact Sensation intact distally Intact pulses distally Dorsiflexion/Plantar flexion intact Dressing - dressing C/D/I Motor Function - intact, moving foot and toes well on exam.   Past Medical History:  Diagnosis Date  . Cancer [facility])    breast - right  . Hypertension   . Pneumonia     Assessment/Plan: 3 Days Post-Op Procedure(s) (LRB): INTRAMEDULLARY (IM) NAIL INTERTROCHANTRIC (Right) Active Problems:  History of breast cancer   Essential hypertension   AKI (acute kidney injury) ([place])   Closed right hip fracture (HCC)   Closed intertrochanteric fracture of hip, right, initial encounter ([place])  Estimated body mass index is 27.88 kg/m as calculated from the following:   Height as of this encounter: 5\' 3"  (1.6 m).   Weight as of this encounter: 71.4 kg. Advance diet Up with therapy  DVT Prophylaxis - Lovenox with transition to 81mg  ASA BID upon d/c x6 weeks Weight bearing as tolerated RLE  Plan is to go Skilled nursing facility at [facility] after hospital stay. Plan for discharge to SNF on Monday. Continue working with therapy. Follow up with Dr. Lyla Glassing in the office for post-op visit.  Griffith Citron, PA-C Orthopedic Surgery 07/13/2018, 11:55 AM

## 2018-07-13 NOTE — Progress Notes (Signed)
PROGRESS NOTE    [name]  MWN:[id number] DOB: [birth year] DOA: 07/09/2018 PCP: Lajean Manes, MD   Brief Narrative:  85 yo with hx of HTN, breast cancer who presented after a mechanical fall with R femur fracture.   Assessment & Plan:   Active Problems:   History of breast cancer   Essential hypertension   AKI (acute kidney injury) ([place])   Closed right hip fracture (HCC)   Closed intertrochanteric fracture of hip, right, initial encounter [facility])   Right hip fracture  Mechanical Fall:  Imaging with intertrochanteric fracture of the R femur with varus impaction Knee plain films negative Head and c spine CT without acute findings S/p intramedullary fixation of R femur on 7/2 Start vitamin D supplementation with deficiency WBAT RLE with rolling walker Plan for lovenox while in house and d/c on ASA for DVT ppx Doing well, actually had LLE pain last night now resolved after pain meds (suspect muscle spasm) D/c pending SNF placement  HTN: holding lasix, continue amlodipine  AKI: baseline creatinine appears to be ~1, creatinine was 1.36 at presentation.  Improved.  Pyuria: follow urine culture -> no growth  LLE wound: wound care c/s, appreciate recommendations.    DVT prophylaxis: SCD Code Status: DNR Family Communication: none at bedside  Disposition Plan: pending OR and clearance by ortho   Consultants:   orthopedics  Procedures:   none  Antimicrobials:  Anti-infectives (From admission, onward)   Start     Dose/Rate Route Frequency Ordered Stop   07/10/18 1800  ceFAZolin (ANCEF) IVPB 2g/100 mL premix     2 g 200 mL/hr over 30 Minutes Intravenous Every 6 hours 07/10/18 1645 07/11/18 0034   07/10/18 0600  ceFAZolin (ANCEF) IVPB 2g/100 mL premix     2 g 200 mL/hr over 30 Minutes Intravenous On call to O.R. 07/10/18 0125 07/10/18 1315     Subjective: C/o LLE pain last night, now resolved  Objective: Vitals:   07/12/18 1406 07/12/18 2117 07/13/18 0557  07/13/18 0601  BP: (!) 143/61 (!) 155/53 (!) 139/54 (!) 139/54  Pulse: 90 87 98 84  Resp: 16 16 16    Temp: 97.8 F (36.6 C) 98.5 F (36.9 C) 97.7 F (36.5 C)   TempSrc: Oral Oral    SpO2: 99% 98% 96% 98%  Weight:      Height:        Intake/Output Summary (Last 24 hours) at 07/13/2018 1203 Last data filed at 07/13/2018 0820 Gross per 24 hour  Intake 1080 ml  Output 1800 ml  Net -720 ml   Filed Weights   07/09/18 1808 07/10/18 0124  Weight: 71.2 kg 71.4 kg    Examination:  General: No acute distress. Cardiovascular: Heart sounds show a regular rate, and rhythm. Lungs: Clear to auscultation bilaterally  Abdomen: Soft, nontender, nondistended Neurological: Alert and oriented 3. Moves all extremities 4. Cranial nerves II through XII grossly intact. Skin: Warm and dry. No rashes or lesions. Extremities: RLE with dressing intact from surgery.         Data Reviewed: I have personally reviewed following labs and imaging studies  CBC: Recent Labs  Lab 07/09/18 1852 07/10/18 0412 07/11/18 0254 07/12/18 0258 07/13/18 0303  WBC 8.5 11.6* 10.6* 10.4 8.1  NEUTROABS 6.0  --   --   --   --   HGB 11.6* 10.7* 9.5* 9.1* 9.1*  HCT 35.6* 33.1* 29.9* 28.1* 29.3*  MCV 105.6* 106.1* 106.4* 106.8* 108.5*  PLT 271 224 219 237 254  Basic Metabolic Panel: Recent Labs  Lab 07/09/18 1852 07/10/18 0412 07/11/18 0254 07/12/18 0258 07/13/18 0303  NA 137 138 138 139 141  K 4.5 4.7 4.7 4.5 4.5  CL 106 109 109 109 111  CO2 25 21* 22 22 24   GLUCOSE 133* 227* 154* 105* 102*  BUN 35* 28* 16 20 15   CREATININE 1.36* 1.12* 0.86 0.83 0.63  CALCIUM 8.7* 8.4* 8.1* 8.5* 8.4*  MG  --   --   --   --  2.2   GFR: Estimated Creatinine Clearance: 40.7 mL/min (by C-G formula based on SCr of 0.63 mg/dL). Liver Function Tests: Recent Labs  Lab 07/10/18 0412 07/12/18 0258 07/13/18 0303  AST  --  27 22  ALT  --  13 12  ALKPHOS  --  47 44  BILITOT  --  0.3 0.8  PROT  --  5.9* 5.8*   ALBUMIN 3.6 3.2* 2.9*   No results for input(s): LIPASE, AMYLASE in the last 168 hours. No results for input(s): AMMONIA in the last 168 hours. Coagulation Profile: Recent Labs  Lab 07/09/18 1852  INR 1.3*   Cardiac Enzymes: No results for input(s): CKTOTAL, CKMB, CKMBINDEX, TROPONINI in the last 168 hours. BNP (last 3 results) No results for input(s): PROBNP in the last 8760 hours. HbA1C: No results for input(s): HGBA1C in the last 72 hours. CBG: No results for input(s): GLUCAP in the last 168 hours. Lipid Profile: No results for input(s): CHOL, HDL, LDLCALC, TRIG, CHOLHDL, LDLDIRECT in the last 72 hours. Thyroid Function Tests: No results for input(s): TSH, T4TOTAL, FREET4, T3FREE, THYROIDAB in the last 72 hours. Anemia Panel: No results for input(s): VITAMINB12, FOLATE, FERRITIN, TIBC, IRON, RETICCTPCT in the last 72 hours. Sepsis Labs: No results for input(s): PROCALCITON, LATICACIDVEN in the last 168 hours.  Recent Results (from the past 240 hour(s))  SARS Coronavirus 2 (CEPHEID - Performed in [place] hospital lab), Hosp Order     Status: None   Collection Time: 07/09/18  8:10 PM   Specimen: Nasopharyngeal Swab  Result Value Ref Range Status   SARS Coronavirus 2 NEGATIVE NEGATIVE Final    Comment: (NOTE) If result is NEGATIVE SARS-CoV-2 target nucleic acids are NOT DETECTED. The SARS-CoV-2 RNA is generally detectable in upper and lower  respiratory specimens during the acute phase of infection. The lowest  concentration of SARS-CoV-2 viral copies this assay can detect is 250  copies / mL. A negative result does not preclude SARS-CoV-2 infection  and should not be used as the sole basis for treatment or other  patient management decisions.  A negative result may occur with  improper specimen collection / handling, submission of specimen other  than nasopharyngeal swab, presence of viral mutation(s) within the  areas targeted by this assay, and inadequate number of  viral copies  (<250 copies / mL). A negative result must be combined with clinical  observations, patient history, and epidemiological information. If result is POSITIVE SARS-CoV-2 target nucleic acids are DETECTED. The SARS-CoV-2 RNA is generally detectable in upper and lower  respiratory specimens dur ing the acute phase of infection.  Positive  results are indicative of active infection with SARS-CoV-2.  Clinical  correlation with patient history and other diagnostic information is  necessary to determine patient infection status.  Positive results do  not rule out bacterial infection or co-infection with other viruses. If result is PRESUMPTIVE POSTIVE SARS-CoV-2 nucleic acids MAY BE PRESENT.   A presumptive positive result was obtained on the  submitted specimen  and confirmed on repeat testing.  While 2019 novel coronavirus  (SARS-CoV-2) nucleic acids may be present in the submitted sample  additional confirmatory testing may be necessary for epidemiological  and / or clinical management purposes  to differentiate between  SARS-CoV-2 and other Sarbecovirus currently known to infect humans.  If clinically indicated additional testing with an alternate test  methodology [phone]) is advised. The SARS-CoV-2 RNA is generally  detectable in upper and lower respiratory sp ecimens during the acute  phase of infection. The expected result is Negative. Fact Sheet for Patients:  StrictlyIdeas.no Fact Sheet for Healthcare Providers: BankingDealers.co.za This test is not yet approved or cleared by the [place] FDA and has been authorized for detection and/or diagnosis of SARS-CoV-2 by FDA under an Emergency Use Authorization (EUA).  This EUA will remain in effect (meaning this test can be used) for the duration of the COVID-19 declaration under Section 564(b)(1) of the Act, 21 U.S.C. section 360bbb-3(b)(1), unless the authorization is  terminated or revoked sooner. Performed at [facility], [place] [address] [place], [place] [address]   MRSA PCR Screening     Status: None   Collection Time: 07/10/18  1:56 AM   Specimen: Nasal Mucosa; Nasopharyngeal  Result Value Ref Range Status   MRSA by PCR NEGATIVE NEGATIVE Final    Comment:        The GeneXpert MRSA Assay (FDA approved for NASAL specimens only), is one component of a comprehensive MRSA colonization surveillance program. It is not intended to diagnose MRSA infection nor to guide or monitor treatment for MRSA infections. Performed at [facility], [place] [address] [place], [place] [address]   Culture, Urine     Status: None   Collection Time: 07/10/18  6:28 AM   Specimen: Urine, Clean Catch  Result Value Ref Range Status   Specimen Description   Final    URINE, CLEAN CATCH Performed at [facility], [place] [address], [place], [place] [address]    Special Requests   Final    NONE Performed at [facility], [place] [address] [place], [place] [address]    Culture   Final    NO GROWTH Performed at [facility], [place] [address] [place],  [address]    Report Status 07/11/2018 FINAL  Final         Radiology Studies: No results found.      Scheduled Meds: . acetaminophen  1,000 mg Oral Q8H  . cholecalciferol  1,000 Units Oral Daily  . docusate sodium  100 mg Oral BID  . enoxaparin (LOVENOX) injection  40 mg Subcutaneous Q24H  . senna  1 tablet Oral BID   Continuous Infusions:    LOS: 4 days    Time spent: over 30 min    Fayrene Helper, MD [facility] Pager AMION  If 7PM-7AM, please contact night-coverage [email or web address] Password [email or web address] 07/13/2018, 12:03 PM

## 2018-07-14 ENCOUNTER — Encounter (HOSPITAL_COMMUNITY): Payer: Self-pay | Admitting: Orthopedic Surgery

## 2018-07-14 MED ORDER — ACETAMINOPHEN 325 MG PO TABS
650.0000 mg | ORAL_TABLET | Freq: Four times a day (QID) | ORAL | Status: DC | PRN
  Administered 2018-07-14 – 2018-07-15 (×2): 650 mg via ORAL
  Filled 2018-07-14 (×2): qty 2

## 2018-07-14 NOTE — Care Management Important Message (Signed)
Important Message  Patient Details  Name: [name] MRN: [id number] Date of Birth: 1922-12-07   Medicare Important Message Given:  Yes. CMA printed out patient's IM for the CSW or Case Manager Nurse to give to the patient.      [name] 07/14/2018, 9:10 AM

## 2018-07-14 NOTE — Progress Notes (Signed)
Physical Therapy Treatment Patient Details Name: [name] MRN: [id number] DOB: 1922/05/29 Today's Date: 07/14/2018    History of Present Illness 85 yo female admitted after fall resulting in right hip fx. s/p IM nail per Dr. Lyla Glassing on 07/10/18. PMHx:RUE birth (brachial plexus) injury    PT Comments    POD # 4 Pt was OOB in recliner.  Pulled recliner into hallway for more room.  Assisted with amb pt twice.  General transfer comment: + 2 side by side assist and 75% VC's on proper hand placement  from recliner to bed with difficulty weight shifting and difficulty supporting self upright. General Gait Details: + 2 side by side assist with 75% VC's on proper hand placement to push up from recliner vs pull on walker.  Limited distanc3e due to weakness, fatigue and pain.  Amb twice with seated rest break. Pt is from [place] and will need ST Rehab at SNF prior to safely returning  Follow Up Recommendations  SNF     Equipment Recommendations  None recommended by PT    Recommendations for Other Services       Precautions / Restrictions Precautions Precautions: Fall Precaution Comments: limited R shoulder Restrictions Weight Bearing Restrictions: No Other Position/Activity Restrictions: WBAT    Mobility  Bed Mobility               General bed mobility comments: OOB in recliner  Transfers Overall transfer level: Needs assistance Equipment used: Rolling walker (2 wheeled) Transfers: Sit to/from [facility] Sit to Stand: Mod assist;Max assist;+2 physical assistance;+2 safety/equipment Stand pivot transfers: Max assist;+2 physical assistance;+2 safety/equipment       General transfer comment: + 2 side by side assist and 75% VC's on proper hand placement  from recliner to bed with difficulty weight shifting and difficulty supporting self upright.  Ambulation/Gait Ambulation/Gait assistance: Max assist;+2 physical assistance;+2 safety/equipment Gait  Distance (Feet): 8 Feet(5 feet then another 3 feet) Assistive device: Rolling walker (2 wheeled) Gait Pattern/deviations: Step-to pattern;Decreased step length - right;Decreased step length - left;Decreased stance time - right Gait velocity: decreased   General Gait Details: + 2 side by side assist with 75% VC's on proper hand placement to push up from recliner vs pull on walker.  Limited distanc3e due to weakness, fatigue and pain.  Amb twice with seated rest break.   Stairs             Wheelchair Mobility    Modified Rankin (Stroke Patients Only)       Balance                                            Cognition Arousal/Alertness: Awake/alert Behavior During Therapy: WFL for tasks assessed/performed Overall Cognitive Status: Within Functional Limits for tasks assessed                                 General Comments: pleasant      Exercises      General Comments        Pertinent Vitals/Pain Pain Assessment: 0-10 Pain Score: 3  Pain Location: right hip Pain Descriptors / Indicators: Grimacing;Sore;Operative site guarding Pain Intervention(s): Monitored during session;Repositioned    Home Living  Prior Function            PT Goals (current goals can now be found in the care plan section) Progress towards PT goals: Progressing toward goals    Frequency    Min 3X/week      PT Plan Current plan remains appropriate    Co-evaluation              AM-PAC PT "6 Clicks" Mobility   Outcome Measure  Help needed turning from your back to your side while in a flat bed without using bedrails?: A Lot Help needed moving from lying on your back to sitting on the side of a flat bed without using bedrails?: A Lot Help needed moving to and from a bed to a chair (including a wheelchair)?: A Lot Help needed standing up from a chair using your arms (e.g., wheelchair or bedside chair)?: A Lot Help  needed to walk in hospital room?: A Lot Help needed climbing 3-5 steps with a railing? : Total 6 Click Score: 11    End of Session Equipment Utilized During Treatment: Gait belt Activity Tolerance: Patient limited by fatigue;Patient limited by pain Patient left: in chair;with call bell/phone within reach Nurse Communication: Mobility status PT Visit Diagnosis: Difficulty in walking, not elsewhere classified (R26.2)     Time: 8115-7262 PT Time Calculation (min) (ACUTE ONLY): 25 min  Charges:  $Gait Training: 8-22 mins $Therapeutic Activity: 8-22 mins                     Rica Koyanagi  PTA Acute  Rehabilitation Services Pager      [phone] Office      [phone]

## 2018-07-14 NOTE — Progress Notes (Signed)
PROGRESS NOTE    [name]  XBM:[id number] DOB: [birth year] DOA: 07/09/2018 PCP: Rachael Manes, MD   Brief Narrative:  85 yo with hx of HTN, breast cancer who presented after [name] mechanical fall with R femur fracture.   Assessment & Plan:   Active Problems:   History of breast cancer   Essential hypertension   AKI (acute kidney injury) ([place])   Closed right hip fracture (HCC)   Closed intertrochanteric fracture of hip, right, initial encounter [facility])   Right hip fracture  Mechanical Fall:  Imaging with intertrochanteric fracture of the R femur with varus impaction Knee plain films negative Head and c spine CT without acute findings S/p intramedullary fixation of R femur on 7/2 Start vitamin D supplementation with deficiency WBAT RLE with rolling walker Plan for lovenox while in house and d/c on ASA for DVT ppx Doing well, actually had LLE pain last night now resolved after pain meds (suspect muscle spasm) D/c pending SNF placement.  Discussed with SW today.  HTN: holding lasix, continue amlodipine  AKI: baseline creatinine appears to be ~1, creatinine was 1.36 at presentation.  Improved.  Pyuria: follow urine culture -> no growth  LLE wound: wound care c/s, appreciate recommendations.    DVT prophylaxis: SCD Code Status: DNR Family Communication: none at bedside  Disposition Plan: pending OR and clearance by ortho   Consultants:   orthopedics  Procedures:   none  Antimicrobials:  Anti-infectives (From admission, onward)   Start     Dose/Rate Route Frequency Ordered Stop   07/10/18 1800  ceFAZolin (ANCEF) IVPB 2g/100 mL premix     2 g 200 mL/hr over 30 Minutes Intravenous Every 6 hours 07/10/18 1645 07/11/18 0034   07/10/18 0600  ceFAZolin (ANCEF) IVPB 2g/100 mL premix     2 g 200 mL/hr over 30 Minutes Intravenous On call to O.R. 07/10/18 0125 07/10/18 1315     Subjective: Pt with muscle spasms to L leg, but better now after meds  Objective:  Vitals:   07/13/18 1329 07/13/18 2023 07/14/18 0547 07/14/18 1403  BP: (!) 143/67 (!) 139/53 108/89 (!) 146/52  Pulse: 85 90 86 83  Resp: 17 20 16 16   Temp: 98.1 F (36.7 C) 97.9 F (36.6 C) 98 F (36.7 C)   TempSrc: Oral Oral Oral   SpO2: 100% 97% 97% 99%  Weight:      Height:        Intake/Output Summary (Last 24 hours) at 07/14/2018 1418 Last data filed at 07/14/2018 0930 Gross per 24 hour  Intake 600 ml  Output 1500 ml  Net -900 ml   Filed Weights   07/09/18 1808 07/10/18 0124  Weight: 71.2 kg 71.4 kg    Examination:  General: No acute distress. Cardiovascular: Heart sounds show [name] regular rate, and rhythm. Lungs: Clear to auscultation bilaterally  Abdomen: Soft, nontender, nondistended  Neurological: Alert and oriented 3. Moves all extremities 4 with equal strength. Cranial nerves II through XII grossly intact. Skin: Warm and dry. No rashes or lesions. Extremities:RLE with intact dressing, LLE with drssing to ankle No TTP to LLE        Data Reviewed: I have personally reviewed following labs and imaging studies  CBC: Recent Labs  Lab 07/09/18 1852 07/10/18 0412 07/11/18 0254 07/12/18 0258 07/13/18 0303  WBC 8.5 11.6* 10.6* 10.4 8.1  NEUTROABS 6.0  --   --   --   --   HGB 11.6* 10.7* 9.5* 9.1* 9.1*  HCT 35.6* 33.1*  29.9* 28.1* 29.3*  MCV 105.6* 106.1* 106.4* 106.8* 108.5*  PLT 271 224 219 237 761   Basic Metabolic Panel: Recent Labs  Lab 07/09/18 1852 07/10/18 0412 07/11/18 0254 07/12/18 0258 07/13/18 0303  NA 137 138 138 139 141  K 4.5 4.7 4.7 4.5 4.5  CL 106 109 109 109 111  CO2 25 21* 22 22 24   GLUCOSE 133* 227* 154* 105* 102*  BUN 35* 28* 16 20 15   CREATININE 1.36* 1.12* 0.86 0.83 0.63  CALCIUM 8.7* 8.4* 8.1* 8.5* 8.4*  MG  --   --   --   --  2.2   GFR: Estimated Creatinine Clearance: 40.7 mL/min (by C-G formula based on SCr of 0.63 mg/dL). Liver Function Tests: Recent Labs  Lab 07/10/18 0412 07/12/18 0258 07/13/18 0303  AST   --  27 22  ALT  --  13 12  ALKPHOS  --  47 44  BILITOT  --  0.3 0.8  PROT  --  5.9* 5.8*  ALBUMIN 3.6 3.2* 2.9*   No results for input(s): LIPASE, AMYLASE in the last 168 hours. No results for input(s): AMMONIA in the last 168 hours. Coagulation Profile: Recent Labs  Lab 07/09/18 1852  INR 1.3*   Cardiac Enzymes: No results for input(s): CKTOTAL, CKMB, CKMBINDEX, TROPONINI in the last 168 hours. BNP (last 3 results) No results for input(s): PROBNP in the last 8760 hours. HbA1C: No results for input(s): HGBA1C in the last 72 hours. CBG: No results for input(s): GLUCAP in the last 168 hours. Lipid Profile: No results for input(s): CHOL, HDL, LDLCALC, TRIG, CHOLHDL, LDLDIRECT in the last 72 hours. Thyroid Function Tests: No results for input(s): TSH, T4TOTAL, FREET4, T3FREE, THYROIDAB in the last 72 hours. Anemia Panel: No results for input(s): VITAMINB12, FOLATE, FERRITIN, TIBC, IRON, RETICCTPCT in the last 72 hours. Sepsis Labs: No results for input(s): PROCALCITON, LATICACIDVEN in the last 168 hours.  Recent Results (from the past 240 hour(s))  SARS Coronavirus 2 (CEPHEID - Performed in [place] hospital lab), Hosp Order     Status: None   Collection Time: 07/09/18  8:10 PM   Specimen: Nasopharyngeal Swab  Result Value Ref Range Status   SARS Coronavirus 2 NEGATIVE NEGATIVE Final    Comment: (NOTE) If result is NEGATIVE SARS-CoV-2 target nucleic acids are NOT DETECTED. The SARS-CoV-2 RNA is generally detectable in upper and lower  respiratory specimens during the acute phase of infection. The lowest  concentration of SARS-CoV-2 viral copies this assay can detect is 250  copies / mL. [name] negative result does not preclude SARS-CoV-2 infection  and should not be used as the sole basis for treatment or other  patient management decisions.  [name] negative result may occur with  improper specimen collection / handling, submission of specimen other  than nasopharyngeal swab,  presence of viral mutation(s) within the  areas targeted by this assay, and inadequate number of viral copies  (<250 copies / mL). [name] negative result must be combined with clinical  observations, patient history, and epidemiological information. If result is POSITIVE SARS-CoV-2 target nucleic acids are DETECTED. The SARS-CoV-2 RNA is generally detectable in upper and lower  respiratory specimens dur ing the acute phase of infection.  Positive  results are indicative of active infection with SARS-CoV-2.  Clinical  correlation with patient history and other diagnostic information is  necessary to determine patient infection status.  Positive results do  not rule out bacterial infection or co-infection with other viruses. If result  is PRESUMPTIVE POSTIVE SARS-CoV-2 nucleic acids MAY BE PRESENT.   Damarious Holtsclaw presumptive positive result was obtained on the submitted specimen  and confirmed on repeat testing.  While 2019 novel coronavirus  (SARS-CoV-2) nucleic acids may be present in the submitted sample  additional confirmatory testing may be necessary for epidemiological  and / or clinical management purposes  to differentiate between  SARS-CoV-2 and other Sarbecovirus currently known to infect humans.  If clinically indicated additional testing with an alternate test  methodology [phone]) is advised. The SARS-CoV-2 RNA is generally  detectable in upper and lower respiratory sp ecimens during the acute  phase of infection. The expected result is Negative. Fact Sheet for Patients:  StrictlyIdeas.no Fact Sheet for Healthcare Providers: BankingDealers.co.za This test is not yet approved or cleared by the [place] FDA and has been authorized for detection and/or diagnosis of SARS-CoV-2 by FDA under an Emergency Use Authorization (EUA).  This EUA will remain in effect (meaning this test can be used) for the duration of the COVID-19 declaration under  Section 564(b)(1) of the Act, 21 U.S.C. section 360bbb-3(b)(1), unless the authorization is terminated or revoked sooner. Performed at [facility], [place] [address], [place], [place] [address]   MRSA PCR Screening     Status: None   Collection Time: 07/10/18  1:56 AM   Specimen: Nasal Mucosa; Nasopharyngeal  Result Value Ref Range Status   MRSA by PCR NEGATIVE NEGATIVE Final    Comment:        The GeneXpert MRSA Assay (FDA approved for NASAL specimens only), is one component of Kecia Swoboda comprehensive MRSA colonization surveillance program. It is not intended to diagnose MRSA infection nor to guide or monitor treatment for MRSA infections. Performed at [facility], [place] [address], [place], [place] [address]   Culture, Urine     Status: None   Collection Time: 07/10/18  6:28 AM   Specimen: Urine, Clean Catch  Result Value Ref Range Status   Specimen Description   Final    URINE, CLEAN CATCH Performed at [facility], [place] [address], [place], [place] [address]    Special Requests   Final    NONE Performed at [facility], [place] [address] [place], [place] [address]    Culture   Final    NO GROWTH Performed at [facility], [place] [address] [place], [place] [address]    Report Status 07/11/2018 FINAL  Final         Radiology Studies: No results found.      Scheduled Meds: . cholecalciferol  1,000 Units Oral Daily  . docusate sodium  100 mg Oral BID  . enoxaparin (LOVENOX) injection  40 mg Subcutaneous Q24H  . senna  1 tablet Oral BID   Continuous Infusions:    LOS: 5 days    Time spent: over 30 min    Fayrene Helper, MD [facility] Pager [email or web address]  If 7PM-7AM, please contact night-coverage [email or web address] Password [email or web address] 07/14/2018, 2:18 PM

## 2018-07-14 NOTE — TOC Initial Note (Signed)
Transition of Care [facility]) - Initial/Assessment Note    Patient Details  Name: [name] MRN: [id number] Date of Birth: 1922-08-04  Transition of [facility]) CM/SW Contact:    Lia Hopping, [place] Phone Number: 07/14/2018, 9:47 AM  Clinical Narrative:   Presented with   fall while walking with her walker and was leaning to the sofa and tipped over she hit her head on the floor no LOC.           Patient live at [facility] with Spouse. Patient progressing slowly with physical therpay and will need short rehab stay before returning home. Patient and spouse agreeable. CSW reached out to Rep. Claiborne Billings. Ship broker process started. CSW will inform medical team when received.   FL2 completed.   Expected Discharge Plan: Skilled Nursing Facility Barriers to Discharge: Insurance Authorization   Patient Goals and CMS Choice Patient states their goals for this hospitalization and ongoing recovery are:: Rehab      Expected Discharge Plan and Services Expected Discharge Plan: [name]       Living arrangements for the past 2 months: [place]                                      Prior Living Arrangements/Services Living arrangements for the past 2 months: [place] Lives with:: Spouse Patient language and need for interpreter reviewed:: No Do you feel safe going back to the place where you live?: Yes      Need for Family Participation in Patient Care: Yes (Comment) Care giver support system in place?: Yes (comment)   Criminal Activity/Legal Involvement Pertinent to Current Situation/Hospitalization: No - Comment as needed  Activities of Daily Living Home Assistive Devices/Equipment: Walker (specify type) ADL Screening (condition at time of admission) Patient's cognitive ability adequate to safely complete daily activities?: Yes Is the patient deaf or have difficulty hearing?: No Does the  patient have difficulty seeing, even when wearing glasses/contacts?: No Does the patient have difficulty concentrating, remembering, or making decisions?: No Patient able to express need for assistance with ADLs?: Yes Does the patient have difficulty dressing or bathing?: No Independently performs ADLs?: Yes (appropriate for developmental age) Does the patient have difficulty walking or climbing stairs?: Yes Weakness of Legs: Both Weakness of Arms/Hands: Right  Permission Sought/Granted Permission sought to share information with : Family Supports Permission granted to share information with : Yes, Verbal Permission Granted     Permission granted to share info w AGENCY: [place] granted to share info w Relationship: Spouse     Emotional Assessment Appearance:: Appears stated age   Affect (typically observed): Calm Orientation: : Oriented to Self, Oriented to Place, Oriented to  Time, Oriented to Situation Alcohol / Substance Use: Not Applicable Psych Involvement: No (comment)  Admission diagnosis:  Preop examination [Z01.818] Displaced intertrochanteric fracture of right femur ([place]) [S72.141A] Closed intertrochanteric fracture of hip, right, initial encounter [facility]) [S72.141A] Patient Active Problem List   Diagnosis Date Noted  . Closed intertrochanteric fracture of hip, right, initial encounter ([place])   . Essential hypertension 07/09/2018  . AKI (acute kidney injury) ([place]) 07/09/2018  . Closed right hip fracture ([place]) 07/09/2018  . History of breast cancer 11/27/2011   PCP:  Lajean Manes, MD Pharmacy:   [facility], [address] [place] [address]  Phone: [phone] Fax: [facility] [place], [place] [address] Phone: [phone] Fax: [phone]     Social Determinants of Health (SDOH)  Interventions    Readmission Risk Interventions No flowsheet data found.

## 2018-07-15 DIAGNOSIS — G8929 Other chronic pain: Secondary | ICD-10-CM | POA: Diagnosis not present

## 2018-07-15 DIAGNOSIS — L97921 Non-pressure chronic ulcer of unspecified part of left lower leg limited to breakdown of skin: Secondary | ICD-10-CM | POA: Diagnosis not present

## 2018-07-15 DIAGNOSIS — S72141A Displaced intertrochanteric fracture of right femur, initial encounter for closed fracture: Secondary | ICD-10-CM | POA: Diagnosis not present

## 2018-07-15 DIAGNOSIS — S81802A Unspecified open wound, left lower leg, initial encounter: Secondary | ICD-10-CM | POA: Diagnosis not present

## 2018-07-15 DIAGNOSIS — R5381 Other malaise: Secondary | ICD-10-CM | POA: Diagnosis not present

## 2018-07-15 DIAGNOSIS — M2550 Pain in unspecified joint: Secondary | ICD-10-CM | POA: Diagnosis not present

## 2018-07-15 DIAGNOSIS — R2689 Other abnormalities of gait and mobility: Secondary | ICD-10-CM | POA: Diagnosis not present

## 2018-07-15 DIAGNOSIS — G8911 Acute pain due to trauma: Secondary | ICD-10-CM | POA: Diagnosis not present

## 2018-07-15 DIAGNOSIS — G4700 Insomnia, unspecified: Secondary | ICD-10-CM | POA: Diagnosis not present

## 2018-07-15 DIAGNOSIS — M6281 Muscle weakness (generalized): Secondary | ICD-10-CM | POA: Diagnosis not present

## 2018-07-15 DIAGNOSIS — Z853 Personal history of malignant neoplasm of breast: Secondary | ICD-10-CM | POA: Diagnosis not present

## 2018-07-15 DIAGNOSIS — N179 Acute kidney failure, unspecified: Secondary | ICD-10-CM | POA: Diagnosis not present

## 2018-07-15 DIAGNOSIS — Z7982 Long term (current) use of aspirin: Secondary | ICD-10-CM | POA: Diagnosis not present

## 2018-07-15 DIAGNOSIS — Z111 Encounter for screening for respiratory tuberculosis: Secondary | ICD-10-CM | POA: Diagnosis not present

## 2018-07-15 DIAGNOSIS — K5900 Constipation, unspecified: Secondary | ICD-10-CM | POA: Diagnosis not present

## 2018-07-15 DIAGNOSIS — R8281 Pyuria: Secondary | ICD-10-CM | POA: Diagnosis not present

## 2018-07-15 DIAGNOSIS — Z9181 History of falling: Secondary | ICD-10-CM | POA: Diagnosis not present

## 2018-07-15 DIAGNOSIS — S72141D Displaced intertrochanteric fracture of right femur, subsequent encounter for closed fracture with routine healing: Secondary | ICD-10-CM | POA: Diagnosis not present

## 2018-07-15 DIAGNOSIS — E559 Vitamin D deficiency, unspecified: Secondary | ICD-10-CM | POA: Diagnosis not present

## 2018-07-15 DIAGNOSIS — M1991 Primary osteoarthritis, unspecified site: Secondary | ICD-10-CM | POA: Diagnosis not present

## 2018-07-15 DIAGNOSIS — Z7401 Bed confinement status: Secondary | ICD-10-CM | POA: Diagnosis not present

## 2018-07-15 DIAGNOSIS — R609 Edema, unspecified: Secondary | ICD-10-CM | POA: Diagnosis not present

## 2018-07-15 DIAGNOSIS — I10 Essential (primary) hypertension: Secondary | ICD-10-CM | POA: Diagnosis not present

## 2018-07-15 DIAGNOSIS — R52 Pain, unspecified: Secondary | ICD-10-CM | POA: Diagnosis not present

## 2018-07-15 DIAGNOSIS — M9701XA Periprosthetic fracture around internal prosthetic right hip joint, initial encounter: Secondary | ICD-10-CM | POA: Diagnosis not present

## 2018-07-15 DIAGNOSIS — M80051D Age-related osteoporosis with current pathological fracture, right femur, subsequent encounter for fracture with routine healing: Secondary | ICD-10-CM | POA: Diagnosis not present

## 2018-07-15 MED ORDER — VITAMIN D 25 MCG (1000 UNIT) PO TABS: 1000.0000 [IU] | ORAL_TABLET | Freq: Every day | ORAL | 0 refills | Status: AC

## 2018-07-15 MED ORDER — CYCLOBENZAPRINE HCL 5 MG PO TABS
5.0000 mg | ORAL_TABLET | Freq: Three times a day (TID) | ORAL | Status: DC | PRN
  Administered 2018-07-15: 5 mg via ORAL
  Filled 2018-07-15: qty 1

## 2018-07-15 NOTE — Discharge Summary (Addendum)
Physician Discharge Summary  [name] MWN:[id number] DOB: [birth year] DOA: 07/09/2018  PCP: Rachael Manes, MD  Admit date: 07/09/2018 Discharge date: 07/15/2018  Time spent: 40 minutes  Recommendations for Outpatient Follow-up:  1. Follow outpatient CBC/CMP 2. Vit D def, started on vit D - follow outpatient  3. Follow up osteoporosis evaluation and management with PCP   Discharge Diagnoses:  Active Problems:   History of breast cancer   Essential hypertension   AKI (acute kidney injury) ([place])   Closed right hip fracture (HCC)   Closed intertrochanteric fracture of hip, right, initial encounter [facility])   Discharge Condition: stable  Diet recommendation: heart healthy  Filed Weights   07/09/18 1808 07/10/18 0124  Weight: 71.2 kg 71.4 kg    History of present illness:  85 yo with hx of HTN, breast cancer who presented after [name] mechanical fall with R femur fracture.  She's now s/p IM fixation of R femur on 7/2.    See below for additional details  Hospital Course:  Right hip fracture  Mechanical Fall:  Imaging with intertrochanteric fracture of the R femur with varus impaction Knee plain films negative Head and c spine CT without acute findings S/p intramedullary fixation of R femur on 7/2 Start vitamin D supplementation with deficiency WBAT RLE with rolling walker S/p lovenox in house, discharge with aspirin twice daily x 6 weeks She's had some muscle spasms in bilateral LE's,improved with meds - no sig abnormality on my exam D/c pending SNF placement  HTN: holding lasix, continue amlodipine  AKI: baseline creatinine appears to be ~1, creatinine was 1.36 at presentation.  Improved.  Pyuria: follow urine culture -> no growth  LLE wound: wound care c/s, appreciate recommendations.   Procedures: S/p intramedullary fixation of R femur on 7/2  Consultations:  orthopedics  Discharge Exam: Vitals:   07/14/18 2105 07/15/18 0510  BP: 139/61 (!) 164/74   Pulse: 84 96  Resp: 19 18  Temp: 98.5 F (36.9 C) 97.6 F (36.4 C)  SpO2: 97% 97%   C/o cramping in R and L legs Hoping for discharge today. Discussed with husband.   General: No acute distress. Cardiovascular: Heart sounds show [name] regular rate, and rhythm. Lungs: Clear to auscultation bilaterally Abdomen: Soft, nontender, nondistended with normal active bowel sounds. No masses. No hepatosplenomegaly. Neurological: Alert and oriented 3. Moves all extremities 4 with equal strength. Cranial nerves II through XII grossly intact. Skin: Warm and dry. No rashes or lesions. Extremities: RLE with intact dressing to R hip, no sig ttp.  LLE without significant ttp at hip.  LLE dressing to ankle intact.   Discharge Instructions   Discharge Instructions    Call MD for:  difficulty breathing, headache or visual disturbances   Complete by: As directed    Call MD for:  extreme fatigue   Complete by: As directed    Call MD for:  hives   Complete by: As directed    Call MD for:  persistant dizziness or light-headedness   Complete by: As directed    Call MD for:  persistant nausea and vomiting   Complete by: As directed    Call MD for:  redness, tenderness, or signs of infection (pain, swelling, redness, odor or green/yellow discharge around incision site)   Complete by: As directed    Call MD for:  severe uncontrolled pain   Complete by: As directed    Call MD for:  temperature >100.4   Complete by: As directed  Diet - low sodium heart healthy   Complete by: As directed    Discharge instructions   Complete by: As directed    You were seen for [name] right hip fracture.  This was repaired by surgery.  You'll be discharged to skilled nursing facility today.  You'll continue aspirin twice daily to help prevent DVTs (follow up with orthopedics for instructions on when this can be discontinued).  Please follow up with orthopedics as an outpatient.  Please follow up with your PCP and  orthopedics to discuss osteoporosis evaluation and management.    Please follow up with your PCP for your lower extremity wound.  Follow the dressing changes as recommended by our wound care nurse (cleanse wound with saline, pat dry, apply [name] xeroform gauze over the wound, then [name] dry gauze, then wrap with kerlix.  Change every 2 days).    Return for new, recurrent, or worsening symptoms.  Please ask your PCP to request records from this hospitalization so they know what was done and what the next steps will be.   Increase activity slowly   Complete by: As directed      Allergies as of 07/15/2018      Reactions   Macrobid [nitrofurantoin Macrocrystal]    Caused shortness of breath.      Medication List    STOP taking these medications   diclofenac sodium 1 % Gel Commonly known as: VOLTAREN   sulfamethoxazole-trimethoprim 800-160 MG tablet Commonly known as: BACTRIM DS     TAKE these medications   amLODipine 2.5 MG tablet Commonly known as: NORVASC Take 2.5 mg by mouth daily.   aspirin 81 MG chewable tablet Commonly known as: Aspirin Childrens Chew 1 tablet (81 mg total) by mouth 2 (two) times daily with [name] meal.   calcium-vitamin D 250-100 MG-UNIT tablet Take 1 tablet by mouth 2 (two) times daily.   Cranberry 500 MG Caps Take by mouth.   diphenhydrAMINE 25 MG tablet Commonly known as: BENADRYL Take 25 mg by mouth at bedtime.   furosemide 40 MG tablet Commonly known as: LASIX Take 40 mg by mouth Daily.   Glucosamine Sulfate 1000 MG Caps Take 1 capsule by mouth daily.   naproxen sodium 220 MG tablet Commonly known as: ALEVE Take 220 mg by mouth daily.   oxyCODONE 5 MG immediate release tablet Commonly known as: Oxy IR/ROXICODONE Take 0.5 tablets (2.5 mg total) by mouth every 4 (four) hours as needed for severe pain.      Allergies  Allergen Reactions  . Macrobid [Nitrofurantoin Macrocrystal]     Caused shortness of breath.   Follow-up Information     [name], Rachael Edelman, MD. Schedule an appointment as soon as possible for [name] visit in 2 weeks.   Specialty: Orthopedic Surgery Why: For wound re-check Contact information: [address] [place] [address] [phone]        Rachael Manes, MD Follow up.   Specialty: Internal Medicine Contact information: [address] [place] [address] [phone]            The results of significant diagnostics from this hospitalization (including imaging, microbiology, ancillary and laboratory) are listed below for reference.    Significant Diagnostic Studies: Ct Head Wo Contrast  Result Date: 07/09/2018 CLINICAL DATA:  85 year old female status post fall on anticoagulation. EXAM: CT HEAD WITHOUT CONTRAST CT CERVICAL SPINE WITHOUT CONTRAST TECHNIQUE: Multidetector CT imaging of the head and cervical spine was performed following the standard protocol without  intravenous contrast. Multiplanar CT image reconstructions of the cervical spine were also generated. COMPARISON:  Brain MRI 06/15/2011. FINDINGS: CT HEAD FINDINGS Brain: Stable cerebral volume. Chronic cerebral white matter changes appear similar to the prior MRI. No midline shift, ventriculomegaly, mass effect, evidence of mass lesion, intracranial hemorrhage or evidence of cortically based acute infarction. No cortical encephalomalacia identified. Vascular: Calcified atherosclerosis at the skull base. No suspicious intracranial vascular hyperdensity. Skull: No fracture identified. Sinuses/Orbits: Visualized paranasal sinuses and mastoids are stable and well pneumatized. Other: No scalp hematoma. Postoperative changes to both globes since the prior MRI. CT CERVICAL SPINE FINDINGS Alignment: Mild reversal of cervical lordosis. Mild degenerative appearing anterolisthesis at C3-C4 and C4-C5. Cervicothoracic junction alignment is within normal limits. Bilateral posterior element alignment is within normal limits.  Skull base and vertebrae: Visualized skull base is intact. No atlanto-occipital dissociation. No acute osseous abnormality identified. Soft tissues and spinal canal: No prevertebral fluid or swelling. No visible canal hematoma. Mild calcified carotid atherosclerosis. Disc levels: Advanced left side facet arthropathy. Disc and endplate degeneration maximal at C5-C6. However, mild if any cervical spinal stenosis. Upper chest: Visible upper thoracic levels appear intact. Negative lung apices. IMPRESSION: 1. No acute traumatic injury identified in the head or cervical spine. 2. Negative for age non contrast CT appearance of the brain. 3. Cervical spine degeneration. Electronically Signed   By: Genevie Ann M.D.   On: 07/09/2018 22:44   Ct Cervical Spine Wo Contrast  Result Date: 07/09/2018 CLINICAL DATA:  85 year old female status post fall on anticoagulation. EXAM: CT HEAD WITHOUT CONTRAST CT CERVICAL SPINE WITHOUT CONTRAST TECHNIQUE: Multidetector CT imaging of the head and cervical spine was performed following the standard protocol without intravenous contrast. Multiplanar CT image reconstructions of the cervical spine were also generated. COMPARISON:  Brain MRI 06/15/2011. FINDINGS: CT HEAD FINDINGS Brain: Stable cerebral volume. Chronic cerebral white matter changes appear similar to the prior MRI. No midline shift, ventriculomegaly, mass effect, evidence of mass lesion, intracranial hemorrhage or evidence of cortically based acute infarction. No cortical encephalomalacia identified. Vascular: Calcified atherosclerosis at the skull base. No suspicious intracranial vascular hyperdensity. Skull: No fracture identified. Sinuses/Orbits: Visualized paranasal sinuses and mastoids are stable and well pneumatized. Other: No scalp hematoma. Postoperative changes to both globes since the prior MRI. CT CERVICAL SPINE FINDINGS Alignment: Mild reversal of cervical lordosis. Mild degenerative appearing anterolisthesis at C3-C4  and C4-C5. Cervicothoracic junction alignment is within normal limits. Bilateral posterior element alignment is within normal limits. Skull base and vertebrae: Visualized skull base is intact. No atlanto-occipital dissociation. No acute osseous abnormality identified. Soft tissues and spinal canal: No prevertebral fluid or swelling. No visible canal hematoma. Mild calcified carotid atherosclerosis. Disc levels: Advanced left side facet arthropathy. Disc and endplate degeneration maximal at C5-C6. However, mild if any cervical spinal stenosis. Upper chest: Visible upper thoracic levels appear intact. Negative lung apices. IMPRESSION: 1. No acute traumatic injury identified in the head or cervical spine. 2. Negative for age non contrast CT appearance of the brain. 3. Cervical spine degeneration. Electronically Signed   By: Genevie Ann M.D.   On: 07/09/2018 22:44   Pelvis Portable  Result Date: 07/10/2018 CLINICAL DATA:  ORIF for right femur fracture. EXAM: PORTABLE PELVIS 1-2 VIEWS COMPARISON:  Intraoperative films from earlier the same day. FINDINGS: Patient is status post dynamic hip screw placement for right femoral neck fracture. No evidence for immediate hardware complications. Gas in the overlying soft tissues is compatible the immediate postoperative state. IMPRESSION: Status post ORIF  for right femoral neck fracture. No evidence for immediate hardware complications. Electronically Signed   By: Misty Stanley M.D.   On: 07/10/2018 17:46   Dg Chest Port 1 View  Result Date: 07/10/2018 CLINICAL DATA:  Preop EXAM: PORTABLE CHEST 1 VIEW COMPARISON:  01/16/2018 FINDINGS: Heart and mediastinal contours are within normal limits. No focal opacities or effusions. No acute bony abnormality. IMPRESSION: No active disease. Electronically Signed   By: Rolm Baptise M.D.   On: 07/10/2018 00:41   Dg Knee Right Port  Result Date: 07/09/2018 CLINICAL DATA:  Displaced intertrochanteric fracture EXAM: PORTABLE RIGHT KNEE - 1-2  VIEW COMPARISON:  None. FINDINGS: Moderate tricompartment degenerative changes with joint space narrowing and spurring, most pronounced in the medial compartment. No acute bony abnormality. Specifically, no fracture, subluxation, or dislocation. No joint effusion. IMPRESSION: Degenerative changes.  No acute bony abnormality. Electronically Signed   By: Rolm Baptise M.D.   On: 07/09/2018 21:44   Dg C-arm 1-60 Min-no Report  Result Date: 07/10/2018 Fluoroscopy was utilized by the requesting physician.  No radiographic interpretation.   Dg Hip Operative Unilat W Or W/o Pelvis Right  Result Date: 07/10/2018 CLINICAL DATA:  Right intertrochanteric femur fracture ORIF. EXAM: OPERATIVE RIGHT HIP (WITH PELVIS IF PERFORMED) TECHNIQUE: Fluoroscopic spot image(s) were submitted for interpretation post-operatively. Fluoroscopy time is 39 seconds. COMPARISON:  Right hip x-rays from same day. FINDINGS: Multiple intraoperative fluoroscopic images demonstrate interval gamma nail fixation of the right intertrochanteric femur fracture, in near anatomic alignment. The lesser trochanter fragment remains displaced. IMPRESSION: 1. Intraoperative fluoroscopic guidance for right intertrochanteric femur fracture ORIF. Electronically Signed   By: Titus Dubin M.D.   On: 07/10/2018 14:27   Dg Hip Unilat With Pelvis 2-3 Views Right  Result Date: 07/09/2018 CLINICAL DATA:  85 year old female status post fall at home with pain. EXAM: DG HIP (WITH OR WITHOUT PELVIS) 2-3V RIGHT COMPARISON:  Pelvis radiograph 03/16/2015. FINDINGS: Comminuted intertrochanteric fracture of the right femur with varus impaction. Mild displacement of the lesser trochanter butterfly fragment. The right femoral head remains normally located. No fracture of the pelvis identified. Grossly intact proximal left femur. Negative visible bowel gas pattern. Partially visible lumbar vertebral augmentation. IMPRESSION: Comminuted intertrochanteric fracture of the right  femur with varus impaction. Electronically Signed   By: Genevie Ann M.D.   On: 07/09/2018 19:58    Microbiology: Recent Results (from the past 240 hour(s))  SARS Coronavirus 2 (CEPHEID - Performed in [place] hospital lab), Hosp Order     Status: None   Collection Time: 07/09/18  8:10 PM   Specimen: Nasopharyngeal Swab  Result Value Ref Range Status   SARS Coronavirus 2 NEGATIVE NEGATIVE Final    Comment: (NOTE) If result is NEGATIVE SARS-CoV-2 target nucleic acids are NOT DETECTED. The SARS-CoV-2 RNA is generally detectable in upper and lower  respiratory specimens during the acute phase of infection. The lowest  concentration of SARS-CoV-2 viral copies this assay can detect is 250  copies / mL. Jeromiah Ohalloran negative result does not preclude SARS-CoV-2 infection  and should not be used as the sole basis for treatment or other  patient management decisions.  Symiah Nowotny negative result may occur with  improper specimen collection / handling, submission of specimen other  than nasopharyngeal swab, presence of viral mutation(s) within the  areas targeted by this assay, and inadequate number of viral copies  (<250 copies / mL). Sorcha Rotunno negative result must be combined with clinical  observations, patient history, and epidemiological information. If result  is POSITIVE SARS-CoV-2 target nucleic acids are DETECTED. The SARS-CoV-2 RNA is generally detectable in upper and lower  respiratory specimens dur ing the acute phase of infection.  Positive  results are indicative of active infection with SARS-CoV-2.  Clinical  correlation with patient history and other diagnostic information is  necessary to determine patient infection status.  Positive results do  not rule out bacterial infection or co-infection with other viruses. If result is PRESUMPTIVE POSTIVE SARS-CoV-2 nucleic acids MAY BE PRESENT.   Ninnie Fein presumptive positive result was obtained on the submitted specimen  and confirmed on repeat testing.  While 2019  novel coronavirus  (SARS-CoV-2) nucleic acids may be present in the submitted sample  additional confirmatory testing may be necessary for epidemiological  and / or clinical management purposes  to differentiate between  SARS-CoV-2 and other Sarbecovirus currently known to infect humans.  If clinically indicated additional testing with an alternate test  methodology [id number]) is advised. The SARS-CoV-2 RNA is generally  detectable in upper and lower respiratory sp ecimens during the acute  phase of infection. The expected result is Negative. Fact Sheet for Patients:  StrictlyIdeas.no Fact Sheet for Healthcare Providers: BankingDealers.co.za This test is not yet approved or cleared by the [place] FDA and has been authorized for detection and/or diagnosis of SARS-CoV-2 by FDA under an Emergency Use Authorization (EUA).  This EUA will remain in effect (meaning this test can be used) for the duration of the COVID-19 declaration under Section 564(b)(1) of the Act, 21 U.S.C. section 360bbb-3(b)(1), unless the authorization is terminated or revoked sooner. Performed at [facility], [place] [address] [place], [place] [address]   MRSA PCR Screening     Status: None   Collection Time: 07/10/18  1:56 AM   Specimen: Nasal Mucosa; Nasopharyngeal  Result Value Ref Range Status   MRSA by PCR NEGATIVE NEGATIVE Final    Comment:        The GeneXpert MRSA Assay (FDA approved for NASAL specimens only), is one component of Desmon Hitchner comprehensive MRSA colonization surveillance program. It is not intended to diagnose MRSA infection nor to guide or monitor treatment for MRSA infections. Performed at [facility], [place] [address], [place], [place] [address]   Culture, Urine     Status: None   Collection Time: 07/10/18  6:28 AM   Specimen: Urine, Clean Catch  Result Value Ref Range Status   Specimen Description    Final    URINE, CLEAN CATCH Performed at [facility], [place] [address] [place], [place] [address]    Special Requests   Final    NONE Performed at [facility], [place] [address], [place], [place] [address]    Culture   Final    NO GROWTH Performed at [facility], [place] [address] [place],  [address]    Report Status 07/11/2018 FINAL  Final     Labs: Basic Metabolic Panel: Recent Labs  Lab 07/09/18 1852 07/10/18 0412 07/11/18 0254 07/12/18 0258 07/13/18 0303  NA 137 138 138 139 141  K 4.5 4.7 4.7 4.5 4.5  CL 106 109 109 109 111  CO2 25 21* 22 22 24   GLUCOSE 133* 227* 154* 105* 102*  BUN 35* 28* 16 20 15   CREATININE 1.36* 1.12* 0.86 0.83 0.63  CALCIUM 8.7* 8.4* 8.1* 8.5* 8.4*  MG  --   --   --   --  2.2   Liver Function Tests: Recent Labs  Lab 07/10/18 0412 07/12/18 0258 07/13/18 0303  AST  --  27 22  ALT  --  13 12  ALKPHOS  --  47 44  BILITOT  --  0.3 0.8  PROT  --  5.9* 5.8*  ALBUMIN 3.6 3.2* 2.9*   No results for input(s): LIPASE, AMYLASE in the last 168 hours. No results for input(s): AMMONIA in the last 168 hours. CBC: Recent Labs  Lab 07/09/18 1852 07/10/18 0412 07/11/18 0254 07/12/18 0258 07/13/18 0303  WBC 8.5 11.6* 10.6* 10.4 8.1  NEUTROABS 6.0  --   --   --   --   HGB 11.6* 10.7* 9.5* 9.1* 9.1*  HCT 35.6* 33.1* 29.9* 28.1* 29.3*  MCV 105.6* 106.1* 106.4* 106.8* 108.5*  PLT 271 224 219 237 254   Cardiac Enzymes: No results for input(s): CKTOTAL, CKMB, CKMBINDEX, TROPONINI in the last 168 hours. BNP: BNP (last 3 results) No results for input(s): BNP in the last 8760 hours.  ProBNP (last 3 results) No results for input(s): PROBNP in the last 8760 hours.  CBG: No results for input(s): GLUCAP in the last 168 hours.     Signed:  Fayrene Helper MD.  [facility] 07/15/2018, 10:18 AM

## 2018-07-15 NOTE — TOC Transition Note (Addendum)
Transition of Care [facility]) - CM/SW Discharge Note   Patient Details  Name: [name] MRN: [id number] Date of Birth: Jul 09, 1922  [facility] CM/SW Contact:  Lia Hopping, [place] Phone Number: 07/15/2018, 10:08 AM   Clinical Narrative:    Ship broker received. Patient can transfer today. Physician notified.  CSW notified the patient spouse.  Nurse will call report: [phone], PTAR to pick up patient at 1:00pm   Final next level of care: Skilled Nursing Facility Barriers to Discharge: Insurance Authorization   Patient Goals and CMS Choice Patient states their goals for this hospitalization and ongoing recovery are:: Rehab   Choice offered to / list presented to : ([facility] a [facility] already prearranged.)  Discharge Placement PASRR number recieved: 07/11/18            Patient chooses bed at: [facility] Patient to be transferred to facility by: [facility] Name of family member notified: Spouse Patient and family notified of of transfer: 07/15/18  Discharge Plan and Services                                     Social Determinants of Health (SDOH) Interventions     Readmission Risk Interventions No flowsheet data found.

## 2018-07-15 NOTE — Progress Notes (Signed)
[name] was called and report was provided to the receiving nurse. Any questions were addressed.

## 2018-07-16 DIAGNOSIS — R8281 Pyuria: Secondary | ICD-10-CM | POA: Diagnosis not present

## 2018-07-16 DIAGNOSIS — M9701XA Periprosthetic fracture around internal prosthetic right hip joint, initial encounter: Secondary | ICD-10-CM | POA: Diagnosis not present

## 2018-07-16 DIAGNOSIS — R609 Edema, unspecified: Secondary | ICD-10-CM | POA: Diagnosis not present

## 2018-07-16 DIAGNOSIS — R52 Pain, unspecified: Secondary | ICD-10-CM | POA: Diagnosis not present

## 2018-07-16 DIAGNOSIS — S81802A Unspecified open wound, left lower leg, initial encounter: Secondary | ICD-10-CM | POA: Diagnosis not present

## 2018-07-16 DIAGNOSIS — M6281 Muscle weakness (generalized): Secondary | ICD-10-CM | POA: Diagnosis not present

## 2018-07-16 DIAGNOSIS — N179 Acute kidney failure, unspecified: Secondary | ICD-10-CM | POA: Diagnosis not present

## 2018-07-16 DIAGNOSIS — E559 Vitamin D deficiency, unspecified: Secondary | ICD-10-CM | POA: Diagnosis not present

## 2018-07-16 DIAGNOSIS — I10 Essential (primary) hypertension: Secondary | ICD-10-CM | POA: Diagnosis not present

## 2018-07-18 DIAGNOSIS — R609 Edema, unspecified: Secondary | ICD-10-CM | POA: Diagnosis not present

## 2018-07-18 DIAGNOSIS — R52 Pain, unspecified: Secondary | ICD-10-CM | POA: Diagnosis not present

## 2018-07-18 DIAGNOSIS — S81802A Unspecified open wound, left lower leg, initial encounter: Secondary | ICD-10-CM | POA: Diagnosis not present

## 2018-07-18 DIAGNOSIS — M6281 Muscle weakness (generalized): Secondary | ICD-10-CM | POA: Diagnosis not present

## 2018-07-18 DIAGNOSIS — N179 Acute kidney failure, unspecified: Secondary | ICD-10-CM | POA: Diagnosis not present

## 2018-07-18 DIAGNOSIS — I10 Essential (primary) hypertension: Secondary | ICD-10-CM | POA: Diagnosis not present

## 2018-07-18 DIAGNOSIS — K5900 Constipation, unspecified: Secondary | ICD-10-CM | POA: Diagnosis not present

## 2018-07-18 DIAGNOSIS — R8281 Pyuria: Secondary | ICD-10-CM | POA: Diagnosis not present

## 2018-07-18 DIAGNOSIS — M9701XA Periprosthetic fracture around internal prosthetic right hip joint, initial encounter: Secondary | ICD-10-CM | POA: Diagnosis not present

## 2018-07-21 DIAGNOSIS — M9701XA Periprosthetic fracture around internal prosthetic right hip joint, initial encounter: Secondary | ICD-10-CM | POA: Diagnosis not present

## 2018-07-21 DIAGNOSIS — E559 Vitamin D deficiency, unspecified: Secondary | ICD-10-CM | POA: Diagnosis not present

## 2018-07-21 DIAGNOSIS — K5900 Constipation, unspecified: Secondary | ICD-10-CM | POA: Diagnosis not present

## 2018-07-21 DIAGNOSIS — Z7982 Long term (current) use of aspirin: Secondary | ICD-10-CM | POA: Diagnosis not present

## 2018-07-21 DIAGNOSIS — R8281 Pyuria: Secondary | ICD-10-CM | POA: Diagnosis not present

## 2018-07-21 DIAGNOSIS — M6281 Muscle weakness (generalized): Secondary | ICD-10-CM | POA: Diagnosis not present

## 2018-07-21 DIAGNOSIS — G8929 Other chronic pain: Secondary | ICD-10-CM | POA: Diagnosis not present

## 2018-07-21 DIAGNOSIS — R609 Edema, unspecified: Secondary | ICD-10-CM | POA: Diagnosis not present

## 2018-07-21 DIAGNOSIS — I10 Essential (primary) hypertension: Secondary | ICD-10-CM | POA: Diagnosis not present

## 2018-07-22 DIAGNOSIS — I10 Essential (primary) hypertension: Secondary | ICD-10-CM | POA: Diagnosis not present

## 2018-07-22 DIAGNOSIS — G8911 Acute pain due to trauma: Secondary | ICD-10-CM | POA: Diagnosis not present

## 2018-07-22 DIAGNOSIS — M80051D Age-related osteoporosis with current pathological fracture, right femur, subsequent encounter for fracture with routine healing: Secondary | ICD-10-CM | POA: Diagnosis not present

## 2018-07-22 DIAGNOSIS — G4700 Insomnia, unspecified: Secondary | ICD-10-CM | POA: Diagnosis not present

## 2018-07-22 DIAGNOSIS — M1991 Primary osteoarthritis, unspecified site: Secondary | ICD-10-CM | POA: Diagnosis not present

## 2018-07-22 DIAGNOSIS — L97921 Non-pressure chronic ulcer of unspecified part of left lower leg limited to breakdown of skin: Secondary | ICD-10-CM | POA: Diagnosis not present

## 2018-07-22 DIAGNOSIS — K5900 Constipation, unspecified: Secondary | ICD-10-CM | POA: Diagnosis not present

## 2018-07-25 DIAGNOSIS — S72141D Displaced intertrochanteric fracture of right femur, subsequent encounter for closed fracture with routine healing: Secondary | ICD-10-CM | POA: Diagnosis not present

## 2018-07-28 DIAGNOSIS — M80051D Age-related osteoporosis with current pathological fracture, right femur, subsequent encounter for fracture with routine healing: Secondary | ICD-10-CM | POA: Diagnosis not present

## 2018-07-28 DIAGNOSIS — G8911 Acute pain due to trauma: Secondary | ICD-10-CM | POA: Diagnosis not present

## 2018-07-28 DIAGNOSIS — I10 Essential (primary) hypertension: Secondary | ICD-10-CM | POA: Diagnosis not present

## 2018-07-28 DIAGNOSIS — M6281 Muscle weakness (generalized): Secondary | ICD-10-CM | POA: Diagnosis not present

## 2018-07-28 DIAGNOSIS — M1991 Primary osteoarthritis, unspecified site: Secondary | ICD-10-CM | POA: Diagnosis not present

## 2018-07-28 DIAGNOSIS — K5900 Constipation, unspecified: Secondary | ICD-10-CM | POA: Diagnosis not present

## 2018-07-28 DIAGNOSIS — R609 Edema, unspecified: Secondary | ICD-10-CM | POA: Diagnosis not present

## 2018-07-28 DIAGNOSIS — G4700 Insomnia, unspecified: Secondary | ICD-10-CM | POA: Diagnosis not present

## 2018-07-28 DIAGNOSIS — E559 Vitamin D deficiency, unspecified: Secondary | ICD-10-CM | POA: Diagnosis not present

## 2018-07-29 DIAGNOSIS — S72141D Displaced intertrochanteric fracture of right femur, subsequent encounter for closed fracture with routine healing: Secondary | ICD-10-CM | POA: Diagnosis not present

## 2018-07-29 DIAGNOSIS — M6281 Muscle weakness (generalized): Secondary | ICD-10-CM | POA: Diagnosis not present

## 2018-07-30 ENCOUNTER — Encounter: Payer: Self-pay | Admitting: *Deleted

## 2018-07-30 ENCOUNTER — Other Ambulatory Visit: Payer: Self-pay | Admitting: *Deleted

## 2018-07-30 NOTE — Patient Outreach (Addendum)
[facility]) Care Management  07/30/2018  Aili Casillas October 04, 1922 [id number]   Transition of Care Referral   Referral Date: 07/29/18 Referral Source:  [employer] inpatient referral  Date of Admission: 07/15/18 Diagnosis:  Displaced  INTERTROCHANTERIC FX RT FEMUR INIT ENC CLO  Date of Discharge: 07/28/18 Facility: [facility] [place]: [facility] medicare Last admission was 07/09/18 to 07/15/18 for hx of breast cancer, HTN, AKI, closed right hip Fx, Closed intertrochanteric Fx of right hip after a mechanical fall, s/p IM fixation of right femur on 07/10/18   Outreach attempt # 1 successful to the home number after 2 calls  Mr Montville answers and reports he does not hear well and offers the phone to Mrs Laba   Patient is able to verify HIPAA Reviewed and addressed Transitional of care referral with patient She reports she was in the middle of walking with her walker. CM apologized and allowed her time to get seated   Transition of care assessment completed  Mrs Marich denies in medical concerns since her d/c home. She states she is only taking tylenol prn for pain of hip during the night.and this is all she prefers. She frequently stated during the call that she was doing well and did not need [facility] services   She reports having a facility therapist and RN to be arriving to visit today 07/30/18 at 3:30 pm  to provide an examination to include BP check. She reports not having a BP cuff in the apartment. CM discussed the importance of monitoring the HTN and offered to assist with obtaining a BP cuff with assist of [employer] but this offer was refused. She reports she can call the facility RN prn She reports she and Mr Wardrop have remote emergency alert buttons to assist     She states she is to have therapy twice a week   She reports she did arrive home on 07/28/18 and needed the time to rest before begin further activities She informs CM she will make  her own follow up appointments and denies concerns with getting and attending these appointments  CM offered assistance and encouraged her to return a call to CM prn   Social: Mrs Lye lives at [facility] with her husband Lucretia Field does not hear well. Mrs Sekula reports she is able to dress independently and her husband assists with her shower She reports her meals are called in and brought to her apartment.   Conditions: hx of breast cancer, HTN, AKI, closed right hip Fx, Closed intertrochanteric Fx of right hip after a mechanical fall, s/p IM fixation of right femur on 07/10/18, LLE wound care     DME RW, Remote button alert system  Medications: She denies concerns with taking medications as prescribed, affording medications, side effects of medications and questions about medications   Advance directives: POA Not interested in other advance directives   Consent: [facility] RN CM reviewed [facility] services with patient. Patient gave verbal consent for services. Advised patient that other post discharge calls may occur to assess how the patient is doing following the recent hospitalization. Patient voiced understanding and was appreciative of f/u call.  Plan: [facility] RN CM will close case at this time as patient has been assessed and no needs identified/needs resolved.   Pt encouraged to return a call to [place] CM prn  [facility] RN CM sent a successful outreach letter as discussed with [facility] brochure enclosed for review  Routed note to MD    Joelene Millin L. Lavina Hamman, RN, BSN, [place] Management Care Coordinator Direct Number [phone] Mobile number [phone]  Main [facility] number [phone] Fax number [phone]

## 2018-08-01 DIAGNOSIS — S72141D Displaced intertrochanteric fracture of right femur, subsequent encounter for closed fracture with routine healing: Secondary | ICD-10-CM | POA: Diagnosis not present

## 2018-08-01 DIAGNOSIS — M6281 Muscle weakness (generalized): Secondary | ICD-10-CM | POA: Diagnosis not present

## 2018-08-05 DIAGNOSIS — S72141D Displaced intertrochanteric fracture of right femur, subsequent encounter for closed fracture with routine healing: Secondary | ICD-10-CM | POA: Diagnosis not present

## 2018-08-05 DIAGNOSIS — M6281 Muscle weakness (generalized): Secondary | ICD-10-CM | POA: Diagnosis not present

## 2018-08-11 DIAGNOSIS — M6281 Muscle weakness (generalized): Secondary | ICD-10-CM | POA: Diagnosis not present

## 2018-08-11 DIAGNOSIS — S72141D Displaced intertrochanteric fracture of right femur, subsequent encounter for closed fracture with routine healing: Secondary | ICD-10-CM | POA: Diagnosis not present

## 2018-08-15 DIAGNOSIS — M6281 Muscle weakness (generalized): Secondary | ICD-10-CM | POA: Diagnosis not present

## 2018-08-15 DIAGNOSIS — S72141D Displaced intertrochanteric fracture of right femur, subsequent encounter for closed fracture with routine healing: Secondary | ICD-10-CM | POA: Diagnosis not present

## 2018-08-18 DIAGNOSIS — S72141D Displaced intertrochanteric fracture of right femur, subsequent encounter for closed fracture with routine healing: Secondary | ICD-10-CM | POA: Diagnosis not present

## 2018-08-18 DIAGNOSIS — M6281 Muscle weakness (generalized): Secondary | ICD-10-CM | POA: Diagnosis not present

## 2018-08-19 DIAGNOSIS — M6281 Muscle weakness (generalized): Secondary | ICD-10-CM | POA: Diagnosis not present

## 2018-08-19 DIAGNOSIS — S72141D Displaced intertrochanteric fracture of right femur, subsequent encounter for closed fracture with routine healing: Secondary | ICD-10-CM | POA: Diagnosis not present

## 2018-08-22 DIAGNOSIS — S72141D Displaced intertrochanteric fracture of right femur, subsequent encounter for closed fracture with routine healing: Secondary | ICD-10-CM | POA: Diagnosis not present

## 2018-08-25 DIAGNOSIS — S72141D Displaced intertrochanteric fracture of right femur, subsequent encounter for closed fracture with routine healing: Secondary | ICD-10-CM | POA: Diagnosis not present

## 2018-08-25 DIAGNOSIS — M6281 Muscle weakness (generalized): Secondary | ICD-10-CM | POA: Diagnosis not present

## 2018-08-26 DIAGNOSIS — M6281 Muscle weakness (generalized): Secondary | ICD-10-CM | POA: Diagnosis not present

## 2018-08-26 DIAGNOSIS — S72141D Displaced intertrochanteric fracture of right femur, subsequent encounter for closed fracture with routine healing: Secondary | ICD-10-CM | POA: Diagnosis not present

## 2018-08-28 DIAGNOSIS — S72141D Displaced intertrochanteric fracture of right femur, subsequent encounter for closed fracture with routine healing: Secondary | ICD-10-CM | POA: Diagnosis not present

## 2018-08-28 DIAGNOSIS — M6281 Muscle weakness (generalized): Secondary | ICD-10-CM | POA: Diagnosis not present

## 2018-08-29 DIAGNOSIS — M6281 Muscle weakness (generalized): Secondary | ICD-10-CM | POA: Diagnosis not present

## 2018-08-29 DIAGNOSIS — S72141D Displaced intertrochanteric fracture of right femur, subsequent encounter for closed fracture with routine healing: Secondary | ICD-10-CM | POA: Diagnosis not present

## 2018-09-02 DIAGNOSIS — S72141D Displaced intertrochanteric fracture of right femur, subsequent encounter for closed fracture with routine healing: Secondary | ICD-10-CM | POA: Diagnosis not present

## 2018-09-02 DIAGNOSIS — M6281 Muscle weakness (generalized): Secondary | ICD-10-CM | POA: Diagnosis not present

## 2018-09-23 DIAGNOSIS — S72141D Displaced intertrochanteric fracture of right femur, subsequent encounter for closed fracture with routine healing: Secondary | ICD-10-CM | POA: Diagnosis not present

## 2018-10-16 DIAGNOSIS — Z23 Encounter for immunization: Secondary | ICD-10-CM | POA: Diagnosis not present

## 2018-10-16 DIAGNOSIS — I10 Essential (primary) hypertension: Secondary | ICD-10-CM | POA: Diagnosis not present

## 2018-10-16 DIAGNOSIS — E559 Vitamin D deficiency, unspecified: Secondary | ICD-10-CM | POA: Diagnosis not present

## 2018-10-16 DIAGNOSIS — M8580 Other specified disorders of bone density and structure, unspecified site: Secondary | ICD-10-CM | POA: Diagnosis not present

## 2018-10-16 DIAGNOSIS — I340 Nonrheumatic mitral (valve) insufficiency: Secondary | ICD-10-CM | POA: Diagnosis not present

## 2018-10-16 DIAGNOSIS — Z1389 Encounter for screening for other disorder: Secondary | ICD-10-CM | POA: Diagnosis not present

## 2018-10-16 DIAGNOSIS — Z79899 Other long term (current) drug therapy: Secondary | ICD-10-CM | POA: Diagnosis not present

## 2018-10-16 DIAGNOSIS — Z0000 Encounter for general adult medical examination without abnormal findings: Secondary | ICD-10-CM | POA: Diagnosis not present

## 2018-10-31 ENCOUNTER — Encounter (HOSPITAL_COMMUNITY): Payer: Self-pay

## 2018-10-31 ENCOUNTER — Encounter (HOSPITAL_COMMUNITY)
Admission: EM | Disposition: A | Discharge status: Home / Self Care | Payer: Self-pay | Source: Home / Self Care | Attending: Internal Medicine

## 2018-10-31 ENCOUNTER — Inpatient Hospital Stay (HOSPITAL_COMMUNITY): Payer: Medicare HMO | Admitting: Anesthesiology

## 2018-10-31 ENCOUNTER — Other Ambulatory Visit: Payer: Self-pay

## 2018-10-31 ENCOUNTER — Inpatient Hospital Stay (HOSPITAL_COMMUNITY): Payer: Medicare HMO

## 2018-10-31 ENCOUNTER — Emergency Department (HOSPITAL_COMMUNITY): Payer: Medicare HMO

## 2018-10-31 ENCOUNTER — Inpatient Hospital Stay (HOSPITAL_COMMUNITY)
Admission: EM | Admit: 2018-10-31 | Discharge: 2018-11-07 | DRG: 482 | Disposition: A | Discharge status: Home / Self Care | Payer: Medicare HMO | Attending: Internal Medicine | Admitting: Internal Medicine

## 2018-10-31 DIAGNOSIS — K5900 Constipation, unspecified: Secondary | ICD-10-CM | POA: Diagnosis not present

## 2018-10-31 DIAGNOSIS — Y9301 Activity, walking, marching and hiking: Secondary | ICD-10-CM | POA: Diagnosis present

## 2018-10-31 DIAGNOSIS — M25552 Pain in left hip: Secondary | ICD-10-CM | POA: Diagnosis not present

## 2018-10-31 DIAGNOSIS — W1809XA Striking against other object with subsequent fall, initial encounter: Secondary | ICD-10-CM | POA: Diagnosis present

## 2018-10-31 DIAGNOSIS — S72142A Displaced intertrochanteric fracture of left femur, initial encounter for closed fracture: Principal | ICD-10-CM | POA: Diagnosis present

## 2018-10-31 DIAGNOSIS — Z66 Do not resuscitate: Secondary | ICD-10-CM | POA: Diagnosis not present

## 2018-10-31 DIAGNOSIS — Z03818 Encounter for observation for suspected exposure to other biological agents ruled out: Secondary | ICD-10-CM | POA: Diagnosis not present

## 2018-10-31 DIAGNOSIS — R42 Dizziness and giddiness: Secondary | ICD-10-CM | POA: Diagnosis not present

## 2018-10-31 DIAGNOSIS — M2550 Pain in unspecified joint: Secondary | ICD-10-CM | POA: Diagnosis not present

## 2018-10-31 DIAGNOSIS — Z9181 History of falling: Secondary | ICD-10-CM | POA: Diagnosis not present

## 2018-10-31 DIAGNOSIS — N179 Acute kidney failure, unspecified: Secondary | ICD-10-CM | POA: Diagnosis not present

## 2018-10-31 DIAGNOSIS — Z20828 Contact with and (suspected) exposure to other viral communicable diseases: Secondary | ICD-10-CM | POA: Diagnosis not present

## 2018-10-31 DIAGNOSIS — Z419 Encounter for procedure for purposes other than remedying health state, unspecified: Secondary | ICD-10-CM

## 2018-10-31 DIAGNOSIS — H04129 Dry eye syndrome of unspecified lacrimal gland: Secondary | ICD-10-CM | POA: Diagnosis not present

## 2018-10-31 DIAGNOSIS — S7292XA Unspecified fracture of left femur, initial encounter for closed fracture: Secondary | ICD-10-CM | POA: Diagnosis not present

## 2018-10-31 DIAGNOSIS — E559 Vitamin D deficiency, unspecified: Secondary | ICD-10-CM | POA: Diagnosis not present

## 2018-10-31 DIAGNOSIS — R0902 Hypoxemia: Secondary | ICD-10-CM | POA: Diagnosis not present

## 2018-10-31 DIAGNOSIS — Z853 Personal history of malignant neoplasm of breast: Secondary | ICD-10-CM | POA: Diagnosis not present

## 2018-10-31 DIAGNOSIS — I491 Atrial premature depolarization: Secondary | ICD-10-CM | POA: Diagnosis not present

## 2018-10-31 DIAGNOSIS — M810 Age-related osteoporosis without current pathological fracture: Secondary | ICD-10-CM | POA: Diagnosis not present

## 2018-10-31 DIAGNOSIS — I959 Hypotension, unspecified: Secondary | ICD-10-CM | POA: Diagnosis not present

## 2018-10-31 DIAGNOSIS — S72002A Fracture of unspecified part of neck of left femur, initial encounter for closed fracture: Principal | ICD-10-CM

## 2018-10-31 DIAGNOSIS — G4700 Insomnia, unspecified: Secondary | ICD-10-CM | POA: Diagnosis not present

## 2018-10-31 DIAGNOSIS — Z7982 Long term (current) use of aspirin: Secondary | ICD-10-CM | POA: Diagnosis not present

## 2018-10-31 DIAGNOSIS — D649 Anemia, unspecified: Secondary | ICD-10-CM | POA: Diagnosis not present

## 2018-10-31 DIAGNOSIS — S50312A Abrasion of left elbow, initial encounter: Secondary | ICD-10-CM | POA: Diagnosis present

## 2018-10-31 DIAGNOSIS — Z809 Family history of malignant neoplasm, unspecified: Secondary | ICD-10-CM | POA: Diagnosis not present

## 2018-10-31 DIAGNOSIS — R0602 Shortness of breath: Secondary | ICD-10-CM | POA: Diagnosis not present

## 2018-10-31 DIAGNOSIS — Z791 Long term (current) use of non-steroidal anti-inflammatories (NSAID): Secondary | ICD-10-CM

## 2018-10-31 DIAGNOSIS — I10 Essential (primary) hypertension: Secondary | ICD-10-CM | POA: Diagnosis not present

## 2018-10-31 DIAGNOSIS — G8929 Other chronic pain: Secondary | ICD-10-CM | POA: Diagnosis not present

## 2018-10-31 DIAGNOSIS — W010XXA Fall on same level from slipping, tripping and stumbling without subsequent striking against object, initial encounter: Secondary | ICD-10-CM | POA: Diagnosis not present

## 2018-10-31 DIAGNOSIS — Z79899 Other long term (current) drug therapy: Secondary | ICD-10-CM | POA: Diagnosis not present

## 2018-10-31 DIAGNOSIS — Z79891 Long term (current) use of opiate analgesic: Secondary | ICD-10-CM

## 2018-10-31 DIAGNOSIS — D72829 Elevated white blood cell count, unspecified: Secondary | ICD-10-CM | POA: Diagnosis not present

## 2018-10-31 DIAGNOSIS — R609 Edema, unspecified: Secondary | ICD-10-CM | POA: Diagnosis not present

## 2018-10-31 DIAGNOSIS — S72002D Fracture of unspecified part of neck of left femur, subsequent encounter for closed fracture with routine healing: Secondary | ICD-10-CM | POA: Diagnosis not present

## 2018-10-31 DIAGNOSIS — Z90710 Acquired absence of both cervix and uterus: Secondary | ICD-10-CM | POA: Diagnosis not present

## 2018-10-31 DIAGNOSIS — S72009A Fracture of unspecified part of neck of unspecified femur, initial encounter for closed fracture: Secondary | ICD-10-CM | POA: Diagnosis present

## 2018-10-31 DIAGNOSIS — D7589 Other specified diseases of blood and blood-forming organs: Secondary | ICD-10-CM | POA: Diagnosis present

## 2018-10-31 DIAGNOSIS — R5381 Other malaise: Secondary | ICD-10-CM | POA: Diagnosis not present

## 2018-10-31 DIAGNOSIS — M6281 Muscle weakness (generalized): Secondary | ICD-10-CM | POA: Diagnosis not present

## 2018-10-31 DIAGNOSIS — W1830XA Fall on same level, unspecified, initial encounter: Secondary | ICD-10-CM | POA: Diagnosis present

## 2018-10-31 DIAGNOSIS — R2689 Other abnormalities of gait and mobility: Secondary | ICD-10-CM | POA: Diagnosis not present

## 2018-10-31 DIAGNOSIS — M62838 Other muscle spasm: Secondary | ICD-10-CM | POA: Diagnosis not present

## 2018-10-31 DIAGNOSIS — Z823 Family history of stroke: Secondary | ICD-10-CM | POA: Diagnosis not present

## 2018-10-31 DIAGNOSIS — R52 Pain, unspecified: Secondary | ICD-10-CM | POA: Diagnosis not present

## 2018-10-31 DIAGNOSIS — Z7401 Bed confinement status: Secondary | ICD-10-CM | POA: Diagnosis not present

## 2018-10-31 DIAGNOSIS — S299XXA Unspecified injury of thorax, initial encounter: Secondary | ICD-10-CM | POA: Diagnosis not present

## 2018-10-31 DIAGNOSIS — Z111 Encounter for screening for respiratory tuberculosis: Secondary | ICD-10-CM | POA: Diagnosis not present

## 2018-10-31 HISTORY — PX: INTRAMEDULLARY (IM) NAIL INTERTROCHANTERIC: SHX5875

## 2018-10-31 LAB — CBC WITH DIFFERENTIAL/PLATELET
Abs Immature Granulocytes: 0.04 10*3/uL (ref 0.00–0.07)
Basophils Absolute: 0 10*3/uL (ref 0.0–0.1)
Basophils Relative: 0 %
Eosinophils Absolute: 0 10*3/uL (ref 0.0–0.5)
Eosinophils Relative: 0 %
HCT: 40.8 % (ref 36.0–46.0)
Hemoglobin: 13 g/dL (ref 12.0–15.0)
Immature Granulocytes: 1 %
Lymphocytes Relative: 28 %
Lymphs Abs: 2.2 10*3/uL (ref 0.7–4.0)
MCH: 32.2 pg (ref 26.0–34.0)
MCHC: 31.9 g/dL (ref 30.0–36.0)
MCV: 101 fL — ABNORMAL HIGH (ref 80.0–100.0)
Monocytes Absolute: 0.7 10*3/uL (ref 0.1–1.0)
Monocytes Relative: 8 %
Neutro Abs: 5 10*3/uL (ref 1.7–7.7)
Neutrophils Relative %: 63 %
Platelets: 339 10*3/uL (ref 150–400)
RBC: 4.04 MIL/uL (ref 3.87–5.11)
RDW: 14.4 % (ref 11.5–15.5)
WBC: 7.9 10*3/uL (ref 4.0–10.5)
nRBC: 0 % (ref 0.0–0.2)

## 2018-10-31 LAB — BASIC METABOLIC PANEL
Anion gap: 11 (ref 5–15)
BUN: 22 mg/dL (ref 8–23)
CO2: 24 mmol/L (ref 22–32)
Calcium: 8.9 mg/dL (ref 8.9–10.3)
Chloride: 103 mmol/L (ref 98–111)
Creatinine, Ser: 0.67 mg/dL (ref 0.44–1.00)
GFR calc Af Amer: >60 mL/min (ref >60)
GFR calc non Af Amer: >60 mL/min (ref >60)
Glucose, Bld: 122 mg/dL — ABNORMAL HIGH (ref 70–99)
Potassium: 4.7 mmol/L (ref 3.5–5.1)
Sodium: 138 mmol/L (ref 135–145)

## 2018-10-31 LAB — SURGICAL PCR SCREEN
MRSA, PCR: NEGATIVE
Staphylococcus aureus: NEGATIVE

## 2018-10-31 LAB — TYPE AND SCREEN
ABO/RH(D): O NEG
Antibody Screen: NEGATIVE

## 2018-10-31 LAB — SARS CORONAVIRUS 2 BY RT PCR (HOSPITAL ORDER, PERFORMED IN ~~LOC~~ HOSPITAL LAB): SARS Coronavirus 2: NEGATIVE

## 2018-10-31 LAB — MAGNESIUM: Magnesium: 2.2 mg/dL (ref 1.7–2.4)

## 2018-10-31 LAB — PROTIME-INR
INR: 1.1 (ref 0.8–1.2)
Prothrombin Time: 14 seconds (ref 11.4–15.2)

## 2018-10-31 SURGERY — FIXATION, FRACTURE, INTERTROCHANTERIC, WITH INTRAMEDULLARY ROD
Anesthesia: General | Site: Hip | Laterality: Left

## 2018-10-31 MED ORDER — TRAMADOL HCL 50 MG PO TABS
50.0000 mg | ORAL_TABLET | Freq: Four times a day (QID) | ORAL | Status: DC | PRN
  Administered 2018-11-01 – 2018-11-07 (×17): 50 mg via ORAL
  Filled 2018-10-31 (×17): qty 1

## 2018-10-31 MED ORDER — LIDOCAINE 2% (20 MG/ML) 5 ML SYRINGE
INTRAMUSCULAR | Status: AC
  Filled 2018-10-31: qty 5

## 2018-10-31 MED ORDER — FENTANYL CITRATE (PF) 100 MCG/2ML IJ SOLN
INTRAMUSCULAR | Status: AC
  Filled 2018-10-31: qty 2

## 2018-10-31 MED ORDER — SUCCINYLCHOLINE CHLORIDE 200 MG/10ML IV SOSY
PREFILLED_SYRINGE | INTRAVENOUS | Status: DC | PRN
  Administered 2018-10-31: 100 mg via INTRAVENOUS

## 2018-10-31 MED ORDER — METOPROLOL TARTRATE 5 MG/5ML IV SOLN: 5.0000 mg | Freq: Four times a day (QID) | INTRAVENOUS | Status: DC | PRN

## 2018-10-31 MED ORDER — ONDANSETRON HCL 4 MG/2ML IJ SOLN
INTRAMUSCULAR | Status: AC
  Filled 2018-10-31: qty 2

## 2018-10-31 MED ORDER — POVIDONE-IODINE 10 % EX SWAB: 2.0000 "application " | Freq: Once | CUTANEOUS | Status: DC

## 2018-10-31 MED ORDER — LIDOCAINE 2% (20 MG/ML) 5 ML SYRINGE
INTRAMUSCULAR | Status: DC | PRN
  Administered 2018-10-31: 60 mg via INTRAVENOUS

## 2018-10-31 MED ORDER — DEXAMETHASONE SODIUM PHOSPHATE 10 MG/ML IJ SOLN
INTRAMUSCULAR | Status: DC | PRN
  Administered 2018-10-31: 4 mg via INTRAVENOUS

## 2018-10-31 MED ORDER — AMLODIPINE BESYLATE 5 MG PO TABS
2.5000 mg | ORAL_TABLET | Freq: Every day | ORAL | Status: DC
  Administered 2018-11-01 – 2018-11-07 (×7): 2.5 mg via ORAL
  Filled 2018-10-31 (×7): qty 1

## 2018-10-31 MED ORDER — CHLORHEXIDINE GLUCONATE 4 % EX LIQD: 60.0000 mL | Freq: Once | CUTANEOUS | Status: DC

## 2018-10-31 MED ORDER — TRANEXAMIC ACID-NACL 1000-0.7 MG/100ML-% IV SOLN
1000.0000 mg | INTRAVENOUS | Status: AC
  Administered 2018-10-31: 1000 mg via INTRAVENOUS

## 2018-10-31 MED ORDER — FENTANYL CITRATE (PF) 100 MCG/2ML IJ SOLN
25.0000 ug | INTRAMUSCULAR | Status: DC | PRN
  Administered 2018-10-31: 25 ug via INTRAVENOUS

## 2018-10-31 MED ORDER — METOCLOPRAMIDE HCL 5 MG/ML IJ SOLN: 10.0000 mg | Freq: Once | INTRAMUSCULAR | Status: DC | PRN

## 2018-10-31 MED ORDER — FENTANYL CITRATE (PF) 250 MCG/5ML IJ SOLN
INTRAMUSCULAR | Status: AC
  Filled 2018-10-31: qty 5

## 2018-10-31 MED ORDER — LACTATED RINGERS IV SOLN
INTRAVENOUS | Status: DC
  Administered 2018-10-31: 19:00:00 via INTRAVENOUS

## 2018-10-31 MED ORDER — SODIUM CHLORIDE 0.9 % IV SOLN
INTRAVENOUS | Status: DC
  Administered 2018-10-31 – 2018-11-01 (×3): via INTRAVENOUS

## 2018-10-31 MED ORDER — CEFAZOLIN SODIUM-DEXTROSE 2-4 GM/100ML-% IV SOLN
2.0000 g | INTRAVENOUS | Status: AC
  Administered 2018-10-31: 2 g via INTRAVENOUS

## 2018-10-31 MED ORDER — SUCCINYLCHOLINE CHLORIDE 200 MG/10ML IV SOSY
PREFILLED_SYRINGE | INTRAVENOUS | Status: AC
  Filled 2018-10-31: qty 10

## 2018-10-31 MED ORDER — HYDROMORPHONE HCL 1 MG/ML IJ SOLN
0.5000 mg | INTRAMUSCULAR | Status: AC | PRN
  Administered 2018-10-31 – 2018-11-03 (×2): 0.5 mg via INTRAVENOUS
  Filled 2018-10-31: qty 0.5
  Filled 2018-10-31: qty 1

## 2018-10-31 MED ORDER — SODIUM CHLORIDE 0.9 % IV SOLN
INTRAVENOUS | Status: DC | PRN
  Administered 2018-10-31: 40 ug/min via INTRAVENOUS

## 2018-10-31 MED ORDER — ONDANSETRON HCL 4 MG/2ML IJ SOLN
INTRAMUSCULAR | Status: DC | PRN
  Administered 2018-10-31: 4 mg via INTRAVENOUS

## 2018-10-31 MED ORDER — SUGAMMADEX SODIUM 200 MG/2ML IV SOLN
INTRAVENOUS | Status: DC | PRN
  Administered 2018-10-31: 150 mg via INTRAVENOUS

## 2018-10-31 MED ORDER — ISOPROPYL ALCOHOL 70 % SOLN
Status: DC | PRN
  Administered 2018-10-31: 1 via TOPICAL

## 2018-10-31 MED ORDER — TRANEXAMIC ACID-NACL 1000-0.7 MG/100ML-% IV SOLN
INTRAVENOUS | Status: AC
  Filled 2018-10-31: qty 100

## 2018-10-31 MED ORDER — ENSURE PRE-SURGERY PO LIQD
296.0000 mL | Freq: Once | ORAL | Status: DC
  Filled 2018-10-31: qty 296

## 2018-10-31 MED ORDER — DOCUSATE SODIUM 100 MG PO CAPS
200.0000 mg | ORAL_CAPSULE | Freq: Every day | ORAL | Status: DC
  Administered 2018-11-01 – 2018-11-07 (×7): 200 mg via ORAL
  Filled 2018-10-31 (×7): qty 2

## 2018-10-31 MED ORDER — CEFAZOLIN SODIUM-DEXTROSE 2-4 GM/100ML-% IV SOLN
INTRAVENOUS | Status: AC
  Filled 2018-10-31: qty 100

## 2018-10-31 MED ORDER — MEPERIDINE HCL 50 MG/ML IJ SOLN: 6.2500 mg | INTRAMUSCULAR | Status: DC | PRN

## 2018-10-31 MED ORDER — ROCURONIUM BROMIDE 50 MG/5ML IV SOSY
PREFILLED_SYRINGE | INTRAVENOUS | Status: DC | PRN
  Administered 2018-10-31: 40 mg via INTRAVENOUS

## 2018-10-31 MED ORDER — ACETAMINOPHEN 325 MG PO TABS
650.0000 mg | ORAL_TABLET | Freq: Three times a day (TID) | ORAL | Status: DC
  Administered 2018-10-31 – 2018-11-07 (×20): 650 mg via ORAL
  Filled 2018-10-31 (×20): qty 2

## 2018-10-31 MED ORDER — PROPOFOL 10 MG/ML IV BOLUS
INTRAVENOUS | Status: DC | PRN
  Administered 2018-10-31: 80 mg via INTRAVENOUS

## 2018-10-31 MED ORDER — SODIUM CHLORIDE 0.9 % IR SOLN
Status: DC | PRN
  Administered 2018-10-31: 1000 mL

## 2018-10-31 MED ORDER — FENTANYL CITRATE (PF) 100 MCG/2ML IJ SOLN
INTRAMUSCULAR | Status: DC | PRN
  Administered 2018-10-31 (×3): 50 ug via INTRAVENOUS

## 2018-10-31 MED ORDER — MECLIZINE HCL 25 MG PO TABS
25.0000 mg | ORAL_TABLET | Freq: Every day | ORAL | Status: DC
  Administered 2018-11-01 – 2018-11-07 (×7): 25 mg via ORAL
  Filled 2018-10-31 (×7): qty 1

## 2018-10-31 SURGICAL SUPPLY — 41 items
BAG ZIPLOCK 12X15 (MISCELLANEOUS) IMPLANT
BIT DRILL AO GAMMA 4.2X340 (BIT) ×2 IMPLANT
CHLORAPREP W/TINT 26 (MISCELLANEOUS) ×3 IMPLANT
COVER PERINEAL POST (MISCELLANEOUS) ×3 IMPLANT
COVER SURGICAL LIGHT HANDLE (MISCELLANEOUS) ×3 IMPLANT
COVER WAND RF STERILE (DRAPES) IMPLANT
DERMABOND ADVANCED (GAUZE/BANDAGES/DRESSINGS) ×4
DERMABOND ADVANCED .7 DNX12 (GAUZE/BANDAGES/DRESSINGS) ×2 IMPLANT
DRAPE C-ARM 42X120 X-RAY (DRAPES) ×3 IMPLANT
DRAPE C-ARMOR (DRAPES) ×3 IMPLANT
DRAPE IMP U-DRAPE 54X76 (DRAPES) ×6 IMPLANT
DRAPE SHEET LG 3/4 BI-LAMINATE (DRAPES) ×6 IMPLANT
DRAPE STERI IOBAN 125X83 (DRAPES) ×3 IMPLANT
DRAPE U-SHAPE 47X51 STRL (DRAPES) ×6 IMPLANT
DRSG MEPILEX BORDER 4X4 (GAUZE/BANDAGES/DRESSINGS) ×6 IMPLANT
DRSG MEPILEX BORDER 4X8 (GAUZE/BANDAGES/DRESSINGS) ×4 IMPLANT
ELECT BLADE TIP CTD 4 INCH (ELECTRODE) IMPLANT
FACESHIELD WRAPAROUND (MASK) ×6 IMPLANT
FACESHIELD WRAPAROUND OR TEAM (MASK) ×2 IMPLANT
GAUZE SPONGE 4X4 12PLY STRL (GAUZE/BANDAGES/DRESSINGS) ×3 IMPLANT
GLOVE BIO SURGEON STRL SZ8.5 (GLOVE) ×6 IMPLANT
GLOVE BIOGEL PI IND STRL 8.5 (GLOVE) ×1 IMPLANT
GLOVE BIOGEL PI INDICATOR 8.5 (GLOVE) ×2
GOWN SPEC L3 XXLG W/TWL (GOWN DISPOSABLE) ×3 IMPLANT
GUIDEROD T2 3X1000 (ROD) ×2 IMPLANT
K-WIRE  3.2X450M STR (WIRE) ×4
K-WIRE 3.2X450M STR (WIRE) ×2
KIT BASIN OR (CUSTOM PROCEDURE TRAY) ×3 IMPLANT
KIT TURNOVER KIT A (KITS) IMPLANT
KWIRE 3.2X450M STR (WIRE) IMPLANT
MANIFOLD NEPTUNE II (INSTRUMENTS) ×3 IMPLANT
MARKER SKIN DUAL TIP RULER LAB (MISCELLANEOUS) ×3 IMPLANT
NAIL TROCH GAMMA 11X18 (Nail) ×2 IMPLANT
PACK GENERAL/GYN (CUSTOM PROCEDURE TRAY) ×3 IMPLANT
REAMER SHAFT BIXCUT (INSTRUMENTS) ×2 IMPLANT
SCREW LAG GAMMA 3 95MM (Screw) ×2 IMPLANT
SCREW LOCKING T2 F/T  5MMX35MM (Screw) ×2 IMPLANT
SCREW LOCKING T2 F/T 5MMX35MM (Screw) IMPLANT
SUT MNCRL AB 3-0 PS2 18 (SUTURE) ×3 IMPLANT
SUT VIC AB 1 CT1 36 (SUTURE) ×3 IMPLANT
TOWEL OR 17X26 10 PK STRL BLUE (TOWEL DISPOSABLE) ×3 IMPLANT

## 2018-10-31 NOTE — Progress Notes (Signed)
TOC CM referral. Pt lives at [address]. Waiting PT recommendations. Pt may need [facility] rehab. [place] [place] [facility] TOC CM [phone]

## 2018-10-31 NOTE — Discharge Instructions (Signed)
 Dr. Darnelle Corp Adult Hip & Knee Specialist [facility] [address] [place], [place] [address] [phone]   POSTOPERATIVE DIRECTIONS    Hip Rehabilitation, Guidelines Following Surgery   WEIGHT BEARING Weight bearing as tolerated with assist device (walker, cane, etc) as directed, use it as long as suggested by your surgeon or therapist, typically at least 4-6 weeks.   HOME CARE INSTRUCTIONS  Remove items at home which could result in a fall. This includes throw rugs or furniture in walking pathways.  Continue medications as instructed at time of discharge.  You may have some home medications which will be placed on hold until you complete the course of blood thinner medication.  4 days after discharge, you may start showering. No tub baths or soaking your incisions. Do not put on socks or shoes without following the instructions of your caregivers.   Sit on chairs with arms. Use the chair arms to help push yourself up when arising.  Arrange for the use of a toilet seat elevator so you are not sitting low.   Walk with walker as instructed.  You may resume a sexual relationship in one month or when given the OK by your caregiver.  Use walker as long as suggested by your caregivers.  Avoid periods of inactivity such as sitting longer than an hour when not asleep. This helps prevent blood clots.  You may return to work once you are cleared by your surgeon.  Do not drive a car for 6 weeks or until released by your surgeon.  Do not drive while taking narcotics.  Wear elastic stockings for two weeks following surgery during the day but you may remove then at night.  Make sure you keep all of your appointments after your operation with all of your doctors and caregivers. You should call the office at the above phone number and make an appointment for approximately two weeks after the date of your surgery. Please pick up a stool softener and laxative  for home use as long as you are requiring pain medications.  ICE to the affected hip every three hours for 30 minutes at a time and then as needed for pain and swelling. Continue to use ice on the hip for pain and swelling from surgery. You may notice swelling that will progress down to the foot and ankle.  This is normal after surgery.  Elevate the leg when you are not up walking on it.   It is important for you to complete the blood thinner medication as prescribed by your doctor.  Continue to use the breathing machine which will help keep your temperature down.  It is common for your temperature to cycle up and down following surgery, especially at night when you are not up moving around and exerting yourself.  The breathing machine keeps your lungs expanded and your temperature down.  RANGE OF MOTION AND STRENGTHENING EXERCISES  These exercises are designed to help you keep full movement of your hip joint. Follow your caregiver's or physical therapist's instructions. Perform all exercises about fifteen times, three times per day or as directed. Exercise both hips, even if you have had only one joint replacement. These exercises can be done on a training (exercise) mat, on the floor, on a table or on a bed. Use whatever works the best and is most comfortable for you. Use music or television while you are exercising so that the exercises are a pleasant break in your day. This   will make your life better with the exercises acting as a break in routine you can look forward to.  Lying on your back, slowly slide your foot toward your buttocks, raising your knee up off the floor. Then slowly slide your foot back down until your leg is straight again.  Lying on your back spread your legs as far apart as you can without causing discomfort.  Lying on your side, raise your upper leg and foot straight up from the floor as far as is comfortable. Slowly lower the leg and repeat.  Lying on your back, tighten up the  muscle in the front of your thigh (quadriceps muscles). You can do this by keeping your leg straight and trying to raise your heel off the floor. This helps strengthen the largest muscle supporting your knee.  Lying on your back, tighten up the muscles of your buttocks both with the legs straight and with the knee bent at a comfortable angle while keeping your heel on the floor.   SKILLED REHAB INSTRUCTIONS: If the patient is transferred to a skilled rehab facility following release from the hospital, a list of the current medications will be sent to the facility for the patient to continue.  When discharged from the skilled rehab facility, please have the facility set up the patient's Home Health Physical Therapy prior to being released. Also, the skilled facility will be responsible for providing the patient with their medications at time of release from the facility to include their pain medication and their blood thinner medication. If the patient is still at the rehab facility at time of the two week follow up appointment, the skilled rehab facility will also need to assist the patient in arranging follow up appointment in our office and any transportation needs.  MAKE SURE YOU:  Understand these instructions.  Will watch your condition.  Will get help right away if you are not doing well or get worse.  Pick up stool softner and laxative for home use following surgery while on pain medications. Daily dry dressing changes as needed. In 4 days, you may remove your dressings and begin taking showers - no tub baths or soaking the incisions. Continue to use ice for pain and swelling after surgery. Do not use any lotions or creams on the incision until instructed by your surgeon.   

## 2018-10-31 NOTE — ED Triage Notes (Signed)
Patient BIB [facility] from [facility] with complaints of left hip pain, with shortening and rotation noted in triage. Patient reports trip and fall with walker this morning. Patient denies LOC. Denies head injury. Skin tear to left elbow noted. Denies blood thinners.  222mcg IV Fentanyl administered by EMS en route- patient reports pain 4/10 on arrival to [facility]. Left FA 22g PIV placed by EMS.

## 2018-10-31 NOTE — Anesthesia Preprocedure Evaluation (Signed)
Anesthesia Evaluation  Patient identified by MRN, date of birth, ID band Patient awake    Reviewed: Allergy & Precautions, NPO status , Patient's Chart, lab work & pertinent test results  Airway Mallampati: II  TM Distance: >3 FB Neck ROM: Full    Dental no notable dental hx.    Pulmonary neg pulmonary ROS,    Pulmonary exam normal breath sounds clear to auscultation       Cardiovascular hypertension, Pt. on medications Normal cardiovascular exam Rhythm:Regular Rate:Normal     Neuro/Psych negative neurological ROS  negative psych ROS   GI/Hepatic negative GI ROS, Neg liver ROS,   Endo/Other  negative endocrine ROS  Renal/GU negative Renal ROS  negative genitourinary   Musculoskeletal negative musculoskeletal ROS (+)   Abdominal   Peds negative pediatric ROS (+)  Hematology negative hematology ROS (+)   Anesthesia Other Findings   Reproductive/Obstetrics negative OB ROS                             Anesthesia Physical Anesthesia Plan  ASA: II  Anesthesia Plan: General   Post-op Pain Management:    Induction: Intravenous  PONV Risk Score and Plan: 3 and Ondansetron and Treatment may vary due to age or medical condition  Airway Management Planned: LMA and Oral ETT  Additional Equipment:   Intra-op Plan:   Post-operative Plan: Extubation in OR  Informed Consent: I have reviewed the patients History and Physical, chart, labs and discussed the procedure including the risks, benefits and alternatives for the proposed anesthesia with the patient or authorized representative who has indicated his/her understanding and acceptance.     Dental advisory given  Plan Discussed with: CRNA  Anesthesia Plan Comments:         Anesthesia Quick Evaluation

## 2018-10-31 NOTE — ED Notes (Signed)
Patient returned from xray.

## 2018-10-31 NOTE — ED Notes (Signed)
Pt transported to xray 

## 2018-10-31 NOTE — Op Note (Signed)
OPERATIVE REPORT  SURGEON: Rod Can, MD   ASSISTANT: Merla Riches, PA-C.  PREOPERATIVE DIAGNOSIS: Left intertrochanteric femur fracture.   POSTOPERATIVE DIAGNOSIS: Left intertrochanteric femur fracture.   PROCEDURE: Intramedullary fixation, Left femur.   IMPLANTS: Stryker Gamma3 Hip Fracture Nail, 11 by 180 mm, 125 degrees. 10.5 x 95 mm Hip Fracture Nail Lag Screw. 5 x 35 mm distal interlocking screw 1.  ANESTHESIA:  General  ESTIMATED BLOOD LOSS:-50 mL    ANTIBIOTICS: 2 g Ancef.  DRAINS: None.  COMPLICATIONS: None.   CONDITION: PACU - hemodynamically stable.Marland Kitchen   BRIEF CLINICAL NOTE: [name] is a 85 y.o. female who presented with an intertrochanteric femur fracture. The patient was admitted to the hospitalist service and underwent perioperative risk stratification and medical optimization. The risks, benefits, and alternatives to the procedure were explained, and the patient elected to proceed.  PROCEDURE IN DETAIL: Surgical site was marked by myself. The patient was taken to the operating room and anesthesia was induced on the bed. The patient was then transferred to the [facility] table and the nonoperative lower extremity was scissored underneath the operative side. The fracture was reduced with traction, internal rotation, and adduction. The hip was prepped and draped in the normal sterile surgical fashion. Timeout was called verifying side and site of surgery. Preop antibiotics were given with 60 minutes of beginning the procedure.  Fluoroscopy was used to define the patient's anatomy. A 4 cm incision was made just proximal to the tip of the greater trochanter. The awl was used to obtain the standard starting point for a trochanteric entry nail under fluoroscopic control. The guidepin was placed. The entry reamer was used to open the proximal femur. Sequential reaming was performed up to a size 12.5 mm with excellent chatter.  On the back table, the nail was assembled  onto the jig. The nail was placed into the femur without any difficulty. Through a separate stab incision, the cannula was placed down to the bone in preparation for the cephalomedullary device. A guidepin was placed into the femoral head using AP and lateral fluoroscopy views. The pin was measured, and then reaming was performed to the appropriate depth. The lag screw was inserted to the appropriate depth. The fracture was compressed through the jig. The setscrew was tightened and then loosened one quarter turn. A separate stab incision was created, and the distal interlocking screw was placed using standard AO technique. The jig was removed. Final AP and lateral fluoroscopy views were obtained to confirm fracture reduction and hardware placement. Tip apex distance was appropriate. There was no chondral penetration.  The wounds were copiously irrigated with saline. The wound was closed in layers with #1 Vicryl for the fascia, 2-0 Monocryl for the deep dermal layer, and 3-0 Monocryl subcuticular stitch. Glue was applied to the skin. Once the glue was fully hardened, sterile dressing was applied. The patient was then awakened from anesthesia and taken to the PACU in stable condition. Sponge needle and instrument counts were correct at the end of the case 2. There were no known complications.  We will readmit the patient to the hospitalist. Weightbearing status will be weightbearing as tolerated with a walker. We will begin Lovenox for DVT prophylaxis and discharge on chewable ASA 81 mg PO BID for 6 weeks. The patient will work with physical therapy and undergo disposition planning.

## 2018-10-31 NOTE — Consult Note (Signed)
ORTHOPAEDIC CONSULTATION  REQUESTING PHYSICIAN: Georgette Shell, MD  PCP:  Lajean Manes, MD  Chief Complaint: left hip injury  HPI: [name] is a 85 y.o. female who complains of left hip pain after she tripped at home earlier today and landed on her left hip.  She had left hip pain and inability to weight-bear.  She was brought to the emergency department at [facility], where x-rays revealed a displaced left intertrochanteric femur fracture.  She was admitted by the hospitalist for perioperative or stratification and medical optimization.  Orthopedic consultation was placed for management of the left hip fracture.  Of note, she is known to me for previous IM fixation of right intertrochanteric femur fracture on 07/10/2018.  Past Medical History:  Diagnosis Date  . Cancer [facility])    breast - right  . Hypertension   . Pneumonia    Past Surgical History:  Procedure Laterality Date  . ABDOMINAL HYSTERECTOMY  1977  . HAND SURGERY  1978  . INTRAMEDULLARY (IM) NAIL INTERTROCHANTERIC Right 07/10/2018   Procedure: INTRAMEDULLARY (IM) NAIL INTERTROCHANTRIC;  Surgeon: Rod Can, MD;  Location: [facility] ORS;  Service: Orthopedics;  Laterality: Right;  . TOE NAIL REMOVAL  2011   Social History   Socioeconomic History  . Marital status: Married    Spouse name: jack  . Number of children: Not on file  . Years of education: Not on file  . Highest education level: Not on file  Occupational History  . Not on file  Social Needs  . Financial resource strain: Not hard at all  . Food insecurity    Worry: Never true    Inability: Never true  . Transportation needs    Medical: No    Non-medical: No  Tobacco Use  . Smoking status: Never Smoker  . Smokeless tobacco: Never Used  Substance and Sexual Activity  . Alcohol use: Not Currently    Comment: 1 GLASS A MONTH  . Drug use: No  . Sexual activity: Not on file  Lifestyle  . Physical activity    Days per week: Not  on file    Minutes per session: Not on file  . Stress: Not on file  Relationships  . Social Herbalist on phone: Not on file    Gets together: Not on file    Attends religious service: Not on file    Active member of club or organization: Not on file    Attends meetings of clubs or organizations: Not on file    Relationship status: Not on file  Other Topics Concern  . Not on file  Social History Narrative   Mrs Sellinger lives with her husband, Barnabas Lister, in their [facility] [facility] apartment    They have medical alert button system for emergency situations   Family History  Problem Relation Age of Onset  . Stroke Mother   . Cancer Father    Allergies  Allergen Reactions  . Macrobid [Nitrofurantoin Macrocrystal]     Caused shortness of breath.  . Other Nausea And Vomiting    All seafood   Prior to Admission medications   Medication Sig Start Date End Date Taking? Authorizing Provider  amLODipine (NORVASC) 2.5 MG tablet Take 2.5 mg by mouth daily.   Yes [provider]  Cranberry 500 MG CAPS Take by mouth.   Yes [provider]  furosemide (LASIX) 40 MG tablet Take 40 mg by mouth Daily.  04/24/11  Yes [provider]  meclizine (ANTIVERT) 25 MG tablet Take 25 mg by mouth daily.   Yes [provider]  Multiple Vitamin (MULTIVITAMIN WITH MINERALS) TABS tablet Take 1 tablet by mouth daily.   Yes [provider]  naproxen sodium (ALEVE) 220 MG tablet Take 220 mg by mouth daily.   Yes [provider]  polyvinyl alcohol (LIQUIFILM TEARS) 1.4 % ophthalmic solution Place 1 drop into both eyes as needed for dry eyes.   Yes [provider]  aspirin (ASPIRIN CHILDRENS) 81 MG chewable tablet Chew 1 tablet (81 mg total) by mouth 2 (two) times daily with a meal. Patient not taking: Reported on 10/31/2018 07/11/18 07/11/19  Rod Can, MD  oxyCODONE (OXY IR/ROXICODONE) 5 MG immediate release tablet Take 0.5 tablets  (2.5 mg total) by mouth every 4 (four) hours as needed for severe pain. Patient not taking: Reported on 10/31/2018 07/11/18   Rod Can, MD   Dg Chest Port 1 View  Result Date: 10/31/2018 CLINICAL DATA:  Fall this morning. Left hip pain. Some shortness of breath. EXAM: PORTABLE CHEST 1 VIEW COMPARISON:  07/09/2018. FINDINGS: Cardiac silhouette normal in size. No mediastinal or hilar masses or evidence of adenopathy. Clear lungs.  No pleural effusion or pneumothorax. Skeletal structures are grossly intact. IMPRESSION: No active disease. Electronically Signed   By: Lajean Manes M.D.   On: 10/31/2018 13:11   Dg Hip Unilat With Pelvis 2-3 Views Left  Result Date: 10/31/2018 CLINICAL DATA:  Left hip pain after trip and fall this morning. Initial encounter. EXAM: DG HIP (WITH OR WITHOUT PELVIS) 2-3V LEFT COMPARISON:  Plain films right hip 07/09/2018. FINDINGS: The patient has an acute left intertrochanteric fracture. Hip screw and short intramedullary nail fix the right intertrochanteric fracture seen on the prior examination. The fracture appears to be a partial union. The exam is otherwise negative. IMPRESSION: Acute left intertrochanteric fracture. Status post fixation of a right intertrochanteric fracture which appears to be a partial union. Electronically Signed   By: Inge Rise M.D.   On: 10/31/2018 13:10    Positive ROS: All other systems have been reviewed and were otherwise negative with the exception of those mentioned in the HPI and as above.  Physical Exam: General: Alert, no acute distress Cardiovascular: No pedal edema Respiratory: No cyanosis, no use of accessory musculature GI: No organomegaly, abdomen is soft and non-tender Skin: No lesions in the area of chief complaint Neurologic: Sensation intact distally Psychiatric: Patient is competent for consent with normal mood and affect Lymphatic: No axillary or cervical lymphadenopathy  MUSCULOSKELETAL: Examination of the  left hip reveals that her skin is intact.  She is shortened and externally rotated.  She has pain with logrolling of the hip.  She is neurovascular intact.  Assessment: Displaced left intertrochanteric femur fracture.  Plan: I discussed the findings with the patient.  The surgery will require surgical stabilization.  We discussed the risk, benefits, and alternatives to intramedullary fixation of the left intertrochanteric femur fracture.  Please see statement of risk.  Plan for surgery tonight.  Mobilize out of bed tomorrow.  We will plan for discharge back to her independent living facility with home health physical therapy, which is what happened the last time.  All questions were solicited and answered.    Bertram Savin, MD [phone]    10/31/2018 7:03 PM

## 2018-10-31 NOTE — Progress Notes (Signed)
Informed patient her son [name] called, she gave permission to update him and have him as a contact.

## 2018-10-31 NOTE — ED Provider Notes (Signed)
[facility] DEPT Provider Note   CSN: [id number] Arrival date & time: 10/31/18  1102     History   Chief Complaint Chief Complaint  Patient presents with  . Fall  . Hip Pain    HPI [name] is a 85 y.o. female.     HPI Patient presents emergency room for evaluation of left hip pain.  Patient was walking with her walker when she tried to take a step backwards and tripped and fell landing on her left hip.  Patient experienced significant pain in her left hip and is pretty sure she broke it.  She was not able to stand and her leg is shortened.  EMS was called and she was transported to the ED.  Patient denies any other injuries other than an abrasion to her left elbow.  She did not hit her head.  She did not lose consciousness.  Patient was given pain medications by EMS.  Past Medical History:  Diagnosis Date  . Cancer [facility])    breast - right  . Hypertension   . Pneumonia     Patient Active Problem List   Diagnosis Date Noted  . Closed intertrochanteric fracture of hip, right, initial encounter ([place])   . Essential hypertension 07/09/2018  . AKI (acute kidney injury) ([place]) 07/09/2018  . Closed right hip fracture ([place]) 07/09/2018  . History of breast cancer 11/27/2011    Past Surgical History:  Procedure Laterality Date  . ABDOMINAL HYSTERECTOMY  1977  . HAND SURGERY  1978  . INTRAMEDULLARY (IM) NAIL INTERTROCHANTERIC Right 07/10/2018   Procedure: INTRAMEDULLARY (IM) NAIL INTERTROCHANTRIC;  Surgeon: Rod Can, MD;  Location: [facility] ORS;  Service: Orthopedics;  Laterality: Right;  . TOE NAIL REMOVAL  2011     OB History   No obstetric history on file.      Home Medications    Prior to Admission medications   Medication Sig Start Date End Date Taking? Authorizing Provider  amLODipine (NORVASC) 2.5 MG tablet Take 2.5 mg by mouth daily.   Yes [provider]  Cranberry 500 MG CAPS Take by mouth.   Yes [provider]  furosemide (LASIX) 40 MG tablet Take 40 mg by mouth Daily.  04/24/11  Yes [provider]  meclizine (ANTIVERT) 25 MG tablet Take 25 mg by mouth daily.   Yes [provider]  Multiple Vitamin (MULTIVITAMIN WITH MINERALS) TABS tablet Take 1 tablet by mouth daily.   Yes [provider]  naproxen sodium (ALEVE) 220 MG tablet Take 220 mg by mouth daily.   Yes [provider]  polyvinyl alcohol (LIQUIFILM TEARS) 1.4 % ophthalmic solution Place 1 drop into both eyes as needed for dry eyes.   Yes [provider]  aspirin (ASPIRIN CHILDRENS) 81 MG chewable tablet Chew 1 tablet (81 mg total) by mouth 2 (two) times daily with a meal. Patient not taking: Reported on 10/31/2018 07/11/18 07/11/19  Rod Can, MD  oxyCODONE (OXY IR/ROXICODONE) 5 MG immediate release tablet Take 0.5 tablets (2.5 mg total) by mouth every 4 (four) hours as needed for severe pain. Patient not taking: Reported on 10/31/2018 07/11/18   Rod Can, MD    Family History Family History  Problem Relation Age of Onset  . Stroke Mother   . Cancer Father     Social History Social History   Tobacco Use  . Smoking status: Never Smoker  . Smokeless tobacco: Never Used  Substance Use Topics  . Alcohol use:  Yes    Comment: 1 GLASS A MONTH  . Drug use: No     Allergies   Macrobid [nitrofurantoin macrocrystal]   Review of Systems Review of Systems  All other systems reviewed and are negative.    Physical Exam Updated Vital Signs BP (!) 161/95   Pulse 71   Temp 97.8 F (36.6 C) (Oral)   Resp 18   Ht 1.626 m (5\' 4" )   Wt 71.4 kg   SpO2 98%   BMI 27.02 kg/m   Physical Exam Vitals signs and nursing note reviewed.  Constitutional:      Appearance: She is well-developed.     Comments: Elderly, frail  HENT:     Head: Normocephalic and atraumatic.     Right Ear: External ear normal.     Left Ear: External ear normal.  Eyes:     General: No scleral  icterus.       Right eye: No discharge.        Left eye: No discharge.     Conjunctiva/sclera: Conjunctivae normal.  Neck:     Musculoskeletal: Neck supple.     Trachea: No tracheal deviation.  Cardiovascular:     Rate and Rhythm: Normal rate and regular rhythm.  Pulmonary:     Effort: Pulmonary effort is normal. No respiratory distress.     Breath sounds: Normal breath sounds. No stridor. No wheezing or rales.  Abdominal:     General: Bowel sounds are normal. There is no distension.     Palpations: Abdomen is soft.     Tenderness: There is no abdominal tenderness. There is no guarding or rebound.  Musculoskeletal:        General: Tenderness present.     Left hip: She exhibits tenderness, bony tenderness and deformity.     Comments: Left lower extremity shortened; chronic deformity left foot, patient does have calluses in this area, no tenderness  Skin:    General: Skin is warm and dry.     Findings: No rash.  Neurological:     Mental Status: She is alert.     Cranial Nerves: No cranial nerve deficit (no facial droop, extraocular movements intact, no slurred speech).     Sensory: No sensory deficit.     Motor: No abnormal muscle tone or seizure activity.     Coordination: Coordination normal.      ED Treatments / Results  Labs (all labs ordered are listed, but only abnormal results are displayed) Labs Reviewed  BASIC METABOLIC PANEL - Abnormal; Notable for the following components:      Result Value   Glucose, Bld 122 (*)    All other components within normal limits  CBC WITH DIFFERENTIAL/PLATELET - Abnormal; Notable for the following components:   MCV 101.0 (*)    All other components within normal limits  SARS CORONAVIRUS 2 BY RT PCR (HOSPITAL ORDER, [place] [facility])  PROTIME-INR  TYPE AND SCREEN    EKG EKG Interpretation  Date/Time:  Friday October 31 2018 12:25:23 EDT Ventricular Rate:  61 PR Interval:    QRS Duration: 77 QT Interval:   408 QTC Calculation: 411 R Axis:   40 Text Interpretation:  Sinus rhythm Ventricular bigeminy Borderline low voltage, extremity leads Probable anteroseptal infarct, old Minimal ST depression, inferior leads No significant change since last tracing Confirmed by Dorie Rank [phone]) on 10/31/2018 2:08:01 PM   Radiology Dg Chest Port 1 View  Result Date: 10/31/2018 CLINICAL DATA:  Fall this  morning. Left hip pain. Some shortness of breath. EXAM: PORTABLE CHEST 1 VIEW COMPARISON:  07/09/2018. FINDINGS: Cardiac silhouette normal in size. No mediastinal or hilar masses or evidence of adenopathy. Clear lungs.  No pleural effusion or pneumothorax. Skeletal structures are grossly intact. IMPRESSION: No active disease. Electronically Signed   By: Lajean Manes M.D.   On: 10/31/2018 13:11   Dg Hip Unilat With Pelvis 2-3 Views Left  Result Date: 10/31/2018 CLINICAL DATA:  Left hip pain after trip and fall this morning. Initial encounter. EXAM: DG HIP (WITH OR WITHOUT PELVIS) 2-3V LEFT COMPARISON:  Plain films right hip 07/09/2018. FINDINGS: The patient has an acute left intertrochanteric fracture. Hip screw and short intramedullary nail fix the right intertrochanteric fracture seen on the prior examination. The fracture appears to be a partial union. The exam is otherwise negative. IMPRESSION: Acute left intertrochanteric fracture. Status post fixation of a right intertrochanteric fracture which appears to be a partial union. Electronically Signed   By: Inge Rise M.D.   On: 10/31/2018 13:10    Procedures Procedures (including critical care time)  Medications Ordered in ED Medications  0.9 %  sodium chloride infusion ( Intravenous New Bag/Given 10/31/18 1505)  HYDROmorphone (DILAUDID) injection 0.5 mg (0.5 mg Intravenous Given 10/31/18 1241)     Initial Impression / Assessment and Plan / ED Course  I have reviewed the triage vital signs and the nursing notes.  Pertinent labs & imaging results  that were available during my care of the patient were reviewed by me and considered in my medical decision making (see chart for details).  Clinical Course as of Oct 31 1531  Fri Oct 31, 2018  1406 Left intertrochanteric hip fracture noted.  Labs otherwise unremarkable.   [TT]  0177 Results reviewed with patient.  Labs unremarkable.  X-ray does confirm her suspected hip fracture.  Patient's pain is still controlled after prior medications.   [LT]  9030 Discussed with OR Nurse, Dr Lyla Glassing is in the OR.  Will consult on pt.   [SP]  2330 He will call back to inform us if he plans on doing the case today or tomorrow   [JK]  1532 Dr Lyla Glassing plan on taking pt to or this evening if possible   [JK]    Clinical Course User Index [JK] Dorie Rank, MD     Patient presented to the ED for evaluation after a hip injury.  Patient unfortunately has an intertrochanteric left hip fracture.  Patient is otherwise medically stable.  Will admit to the hospital for further treatment  Final Clinical Impressions(s) / ED Diagnoses   Final diagnoses:  Closed fracture of left hip, initial encounter [facility])      Dorie Rank, MD 10/31/18 [id number]

## 2018-10-31 NOTE — Anesthesia Procedure Notes (Signed)
Procedure Name: Intubation Date/Time: 10/31/2018 7:19 PM Performed by: Montel Clock, CRNA Pre-anesthesia Checklist: Patient identified, Emergency Drugs available, Suction available, Patient being monitored and Timeout performed Patient Re-evaluated:Patient Re-evaluated prior to induction Oxygen Delivery Method: Circle system utilized Preoxygenation: Pre-oxygenation with 100% oxygen Induction Type: IV induction and Rapid sequence Laryngoscope Size: Mac and 3 Grade View: Grade I Tube type: Oral Tube size: 7.0 mm Number of attempts: 1 Airway Equipment and Method: Stylet Placement Confirmation: ETT inserted through vocal cords under direct vision,  positive ETCO2 and breath sounds checked- equal and bilateral Secured at: 21 cm Tube secured with: Tape Dental Injury: Teeth and Oropharynx as per pre-operative assessment

## 2018-10-31 NOTE — Transfer of Care (Signed)
Immediate Anesthesia Transfer of Care Note  Patient: [name]  Procedure(s) Performed: INTRAMEDULLARY (IM) NAIL INTERTROCHANTRIC (Left Hip)  Patient Location: PACU  Anesthesia Type:General  Level of Consciousness: drowsy and patient cooperative  Airway & Oxygen Therapy: Patient Spontanous Breathing and Patient connected to face mask oxygen  Post-op Assessment: Report given to RN and Post -op Vital signs reviewed and stable  Post vital signs: Reviewed and stable  Last Vitals:  Vitals Value Taken Time  BP 136/99 10/31/18 2052  Temp    Pulse 81 10/31/18 2054  Resp 20 10/31/18 2054  SpO2 100 % 10/31/18 2054  Vitals shown include unvalidated device data.  Last Pain:  Vitals:   10/31/18 1837  TempSrc:   PainSc: 0-No pain      Patients Stated Pain Goal: 3 (74/16/38 4536)  Complications: No apparent anesthesia complications

## 2018-10-31 NOTE — ED Notes (Addendum)
Purewick placed on pt for comfort d/t hip injury

## 2018-10-31 NOTE — H&P (Signed)
History and Physical    [name] BJY:[id number] DOB: 21-Oct-1922 DOA: 10/31/2018  PCP: Lajean Manes, MD Patient coming from: lives in [place] with husband  Chief Complaint: fall  HPI: [name] is a 85 y.o. female with medical history significant of htn, right hip fracture history of breast cancer admitted after a fall with complaints of left hip pain.  Patient at baseline walks with a walker she tripped on the rug and fell landing on her left hip.  She was unable to bear weight on the left leg.  She denied any chest pain shortness of breath headaches prior to fall.  Denies loss of consciousness.  Denies hitting her head.  Patient was at her usual state of health until the fall.  Denies fever chills nausea vomiting diarrhea abdominal pain urinary complaints.  Patient denies having any history of angina or history of CAD.  She is able to walk in a level ground without getting short of breath with her walker.   ED Course: Blood pressure 157/82, pulse 91, respiration 20, temperature 97.8, 100% on room air.  Sodium 138 potassium 4.7 BUN 22 creatinine 0.67 white count 7.9 hemoglobin 13.0 platelet count 339  X-ray hip acute left intertrochanteric fracture status post fixation of the right intertrochanteric fracture which appears to be a partial union  Covid pending  Review of Systems: As per HPI otherwise all other systems reviewed and are negative  Ambulatory Status: Walks with a walker at baseline  Past Medical History:  Diagnosis Date  . Cancer [facility])    breast - right  . Hypertension   . Pneumonia     Past Surgical History:  Procedure Laterality Date  . ABDOMINAL HYSTERECTOMY  1977  . HAND SURGERY  1978  . INTRAMEDULLARY (IM) NAIL INTERTROCHANTERIC Right 07/10/2018   Procedure: INTRAMEDULLARY (IM) NAIL INTERTROCHANTRIC;  Surgeon: Rod Can, MD;  Location: [facility] ORS;  Service: Orthopedics;  Laterality: Right;  . TOE NAIL REMOVAL  2011    Social History    Socioeconomic History  . Marital status: Married    Spouse name: [name]  . Number of children: Not on file  . Years of education: Not on file  . Highest education level: Not on file  Occupational History  . Not on file  Social Needs  . Financial resource strain: Not hard at all  . Food insecurity    Worry: Never true    Inability: Never true  . Transportation needs    Medical: No    Non-medical: No  Tobacco Use  . Smoking status: Never Smoker  . Smokeless tobacco: Never Used  Substance and Sexual Activity  . Alcohol use: Yes    Comment: 1 GLASS A MONTH  . Drug use: No  . Sexual activity: Not on file  Lifestyle  . Physical activity    Days per week: Not on file    Minutes per session: Not on file  . Stress: Not on file  Relationships  . Social Herbalist on phone: Not on file    Gets together: Not on file    Attends religious service: Not on file    Active member of club or organization: Not on file    Attends meetings of clubs or organizations: Not on file    Relationship status: Not on file  . Intimate partner violence    Fear of current or ex partner: Not on file    Emotionally abused: Not on file    Physically abused:  Not on file    Forced sexual activity: Not on file  Other Topics Concern  . Not on file  Social History Narrative   [name] lives with her husband, [name], in their [place] independent living apartment    They have medical alert button system for emergency situations    Allergies  Allergen Reactions  . Macrobid [Nitrofurantoin Macrocrystal]     Caused shortness of breath.    Family History  Problem Relation Age of Onset  . Stroke Mother   . Cancer Father     Prior to Admission medications   Medication Sig Start Date End Date Taking? Authorizing Provider  amLODipine (NORVASC) 2.5 MG tablet Take 2.5 mg by mouth daily.   Yes [provider]  Cranberry 500 MG CAPS Take by mouth.   Yes [provider]   furosemide (LASIX) 40 MG tablet Take 40 mg by mouth Daily.  04/24/11  Yes [provider]  meclizine (ANTIVERT) 25 MG tablet Take 25 mg by mouth daily.   Yes [provider]  Multiple Vitamin (MULTIVITAMIN WITH MINERALS) TABS tablet Take 1 tablet by mouth daily.   Yes [provider]  naproxen sodium (ALEVE) 220 MG tablet Take 220 mg by mouth daily.   Yes [provider]  polyvinyl alcohol (LIQUIFILM TEARS) 1.4 % ophthalmic solution Place 1 drop into both eyes as needed for dry eyes.   Yes [provider]  aspirin (ASPIRIN CHILDRENS) 81 MG chewable tablet Chew 1 tablet (81 mg total) by mouth 2 (two) times daily with a meal. Patient not taking: Reported on 10/31/2018 07/11/18 07/11/19  Rod Can, MD  oxyCODONE (OXY IR/ROXICODONE) 5 MG immediate release tablet Take 0.5 tablets (2.5 mg total) by mouth every 4 (four) hours as needed for severe pain. Patient not taking: Reported on 10/31/2018 07/11/18   Rod Can, MD    Physical Exam: Vitals:   10/31/18 1500 10/31/18 1530 10/31/18 1600 10/31/18 1601  BP: (!) 161/95 (!) 171/101 (!) 157/82   Pulse: 71 89  91  Resp: 18 20 (!) 24 20  Temp:      TempSrc:      SpO2: 98% 98%  100%  Weight:      Height:         . General:  Appears in mild distress due to pain . Eyes:  PERRL, EOMI, normal lids, iris . ENT:  grossly normal hearing, lips & tongue, mmm . Neck:  no LAD, masses or thyromegaly . Cardiovascular: RRR, no m/r/g. No LE edema.  Marland Kitchen Respiratory:  CTA bilaterally, no w/r/r. Normal respiratory effort. . Abdomen:  soft, ntnd, NABS . Skin:  no rash or induration seen on limited exam . Musculoskeletal: Left hip tender to touch left leg shorter compared to right . psychiatric: grossly normal mood and affect, speech fluent and appropriate, AOx3 . Neurologic:  CN 2-12 grossly intact, moves all extremities in coordinated fashion, sensation intact  Labs on Admission: I have personally reviewed  following labs and imaging studies  CBC: Recent Labs  Lab 10/31/18 1200  WBC 7.9  NEUTROABS 5.0  HGB 13.0  HCT 40.8  MCV 101.0*  PLT 371   Basic Metabolic Panel: Recent Labs  Lab 10/31/18 1200  NA 138  K 4.7  CL 103  CO2 24  GLUCOSE 122*  BUN 22  CREATININE 0.67  CALCIUM 8.9   GFR: Estimated Creatinine Clearance: 41.7 mL/min (by C-G formula based on SCr of 0.67 mg/dL). Liver Function Tests: No results  for input(s): AST, ALT, ALKPHOS, BILITOT, PROT, ALBUMIN in the last 168 hours. No results for input(s): LIPASE, AMYLASE in the last 168 hours. No results for input(s): AMMONIA in the last 168 hours. Coagulation Profile: Recent Labs  Lab 10/31/18 1200  INR 1.1   Cardiac Enzymes: No results for input(s): CKTOTAL, CKMB, CKMBINDEX, TROPONINI in the last 168 hours. BNP (last 3 results) No results for input(s): PROBNP in the last 8760 hours. HbA1C: No results for input(s): HGBA1C in the last 72 hours. CBG: No results for input(s): GLUCAP in the last 168 hours. Lipid Profile: No results for input(s): CHOL, HDL, LDLCALC, TRIG, CHOLHDL, LDLDIRECT in the last 72 hours. Thyroid Function Tests: No results for input(s): TSH, T4TOTAL, FREET4, T3FREE, THYROIDAB in the last 72 hours. Anemia Panel: No results for input(s): VITAMINB12, FOLATE, FERRITIN, TIBC, IRON, RETICCTPCT in the last 72 hours. Urine analysis:    Component Value Date/Time   COLORURINE YELLOW 06/15/2011 1628   APPEARANCEUR HAZY (A) 06/15/2011 1628   LABSPEC 1.003 (L) 06/15/2011 1628   PHURINE 7.5 06/15/2011 1628   GLUCOSEU NEGATIVE 06/15/2011 1628   HGBUR TRACE (A) 06/15/2011 1628   BILIRUBINUR NEGATIVE 06/15/2011 1628   KETONESUR NEGATIVE 06/15/2011 1628   PROTEINUR NEGATIVE 06/15/2011 1628   UROBILINOGEN 0.2 06/15/2011 1628   NITRITE NEGATIVE 06/15/2011 1628   LEUKOCYTESUR MODERATE (A) 06/15/2011 1628    Creatinine Clearance: Estimated Creatinine Clearance: 41.7 mL/min (by C-G formula based on SCr  of 0.67 mg/dL).  Sepsis Labs: @LABRCNTIP (procalcitonin:4,lacticidven:4) )No results found for this or any previous visit (from the past 240 hour(s)).   Radiological Exams on Admission: Dg Chest Port 1 View  Result Date: 10/31/2018 CLINICAL DATA:  Fall this morning. Left hip pain. Some shortness of breath. EXAM: PORTABLE CHEST 1 VIEW COMPARISON:  07/09/2018. FINDINGS: Cardiac silhouette normal in size. No mediastinal or hilar masses or evidence of adenopathy. Clear lungs.  No pleural effusion or pneumothorax. Skeletal structures are grossly intact. IMPRESSION: No active disease. Electronically Signed   By: Lajean Manes M.D.   On: 10/31/2018 13:11   Dg Hip Unilat With Pelvis 2-3 Views Left  Result Date: 10/31/2018 CLINICAL DATA:  Left hip pain after trip and fall this morning. Initial encounter. EXAM: DG HIP (WITH OR WITHOUT PELVIS) 2-3V LEFT COMPARISON:  Plain films right hip 07/09/2018. FINDINGS: The patient has an acute left intertrochanteric fracture. Hip screw and short intramedullary nail fix the right intertrochanteric fracture seen on the prior examination. The fracture appears to be a partial union. The exam is otherwise negative. IMPRESSION: Acute left intertrochanteric fracture. Status post fixation of a right intertrochanteric fracture which appears to be a partial union. Electronically Signed   By: Inge Rise M.D.   On: 10/31/2018 13:10    EKG: Ventricular bigeminy T inversions in inferior leads  Assessment/Plan Active Problems:   * No active hospital problems. *   #1 left intertrochanteric hip fracture-status post mechanical fall.  Dr. Lyla Glassing to take patient to the OR most likely today.  We will keep her n.p.o.  Avoid chemical DVT prophylaxis.  Pain control.  IV fluids.  Hold Lasix. Stool softeners. PT OT consult.  #2 history of hypertension we will keep her on as needed Lopressor.  Hold Lasix that she takes at home.  Continue Norvasc when she is able to take p.o.   #3 macrocytosis without anemia check B12 and folate.   Severity of Illness: The appropriate patient status for this patient is INPATIENT. Inpatient status is judged to be  reasonable and necessary in order to provide the required intensity of service to ensure the patient's safety. The patient's presenting symptoms, physical exam findings, and initial radiographic and laboratory data in the context of their chronic comorbidities is felt to place them at high risk for further clinical deterioration. Furthermore, it is not anticipated that the patient will be medically stable for discharge from the hospital within 2 midnights of admission. The following factors support the patient status of inpatient.   " The patient's presenting symptoms include fall left hip pain " The worrisome physical exam findings include left hip fracture" The initial radiographic and laboratory data are worrisome because of left hip fracture. " The chronic co-morbidities include history of right hip fracture old age hypertension history of breast cancer osteoporosis   * I certify that at the point of admission it is my clinical judgment that the patient will require inpatient hospital care spanning beyond 2 midnights from the point of admission due to high intensity of service, high risk for further deterioration and high frequency of surveillance required.*    Estimated body mass index is 27.02 kg/m as calculated from the following:   Height as of this encounter: 5\' 4"  (1.626 m).   Weight as of this encounter: 71.4 kg.   DVT prophylaxis: SCD for surgery today Code Status: DO NOT RESUSCITATE  family Communication: No family in the room she wanted to call her husband herself and inform her husband Disposition Plan: Pending clinical improvement Consults called ED physician discussed with Dr. Lyla Glassing Admit inpatient   Georgette Shell MD [facility]  If 7PM-7AM, please contact night-coverage  [email or web address] Password [email or web address]  10/31/2018, 4:11 PM

## 2018-11-01 DIAGNOSIS — S72002A Fracture of unspecified part of neck of left femur, initial encounter for closed fracture: Secondary | ICD-10-CM | POA: Diagnosis not present

## 2018-11-01 LAB — COMPREHENSIVE METABOLIC PANEL
ALT: 17 U/L (ref 0–44)
AST: 22 U/L (ref 15–41)
Albumin: 3.2 g/dL — ABNORMAL LOW (ref 3.5–5.0)
Alkaline Phosphatase: 52 U/L (ref 38–126)
Anion gap: 9 (ref 5–15)
BUN: 19 mg/dL (ref 8–23)
CO2: 21 mmol/L — ABNORMAL LOW (ref 22–32)
Calcium: 8.4 mg/dL — ABNORMAL LOW (ref 8.9–10.3)
Chloride: 105 mmol/L (ref 98–111)
Creatinine, Ser: 0.63 mg/dL (ref 0.44–1.00)
GFR calc Af Amer: >60 mL/min (ref >60)
GFR calc non Af Amer: >60 mL/min (ref >60)
Glucose, Bld: 159 mg/dL — ABNORMAL HIGH (ref 70–99)
Potassium: 5.1 mmol/L (ref 3.5–5.1)
Sodium: 135 mmol/L (ref 135–145)
Total Bilirubin: 0.5 mg/dL (ref 0.3–1.2)
Total Protein: 5.6 g/dL — ABNORMAL LOW (ref 6.5–8.1)

## 2018-11-01 LAB — FOLATE: Folate: 16.6 ng/mL (ref >5.9)

## 2018-11-01 LAB — CBC
HCT: 31.8 % — ABNORMAL LOW (ref 36.0–46.0)
Hemoglobin: 10.1 g/dL — ABNORMAL LOW (ref 12.0–15.0)
MCH: 32.6 pg (ref 26.0–34.0)
MCHC: 31.8 g/dL (ref 30.0–36.0)
MCV: 102.6 fL — ABNORMAL HIGH (ref 80.0–100.0)
Platelets: 243 10*3/uL (ref 150–400)
RBC: 3.1 MIL/uL — ABNORMAL LOW (ref 3.87–5.11)
RDW: 14.6 % (ref 11.5–15.5)
WBC: 13.1 10*3/uL — ABNORMAL HIGH (ref 4.0–10.5)
nRBC: 0 % (ref 0.0–0.2)

## 2018-11-01 LAB — VITAMIN D 25 HYDROXY (VIT D DEFICIENCY, FRACTURES): Vit D, 25-Hydroxy: 35.31 ng/mL (ref 30–100)

## 2018-11-01 LAB — VITAMIN B12: Vitamin B-12: 359 pg/mL (ref 180–914)

## 2018-11-01 MED ORDER — ENOXAPARIN SODIUM 30 MG/0.3ML ~~LOC~~ SOLN
30.0000 mg | SUBCUTANEOUS | Status: DC
  Administered 2018-11-01 – 2018-11-06 (×6): 30 mg via SUBCUTANEOUS
  Filled 2018-11-01 (×6): qty 0.3

## 2018-11-01 NOTE — Anesthesia Postprocedure Evaluation (Signed)
Anesthesia Post Note  Patient: [name]  Procedure(s) Performed: INTRAMEDULLARY (IM) NAIL INTERTROCHANTRIC (Left Hip)     Patient location during evaluation: PACU Anesthesia Type: General Level of consciousness: awake and alert Pain management: pain level controlled Vital Signs Assessment: post-procedure vital signs reviewed and stable Respiratory status: spontaneous breathing, nonlabored ventilation, respiratory function stable and patient connected to nasal cannula oxygen Cardiovascular status: blood pressure returned to baseline and stable Postop Assessment: no apparent nausea or vomiting Anesthetic complications: no    Last Vitals:  Vitals:   10/31/18 2356 11/01/18 0048  BP: (!) 139/53 (!) 136/47  Pulse: 75 76  Resp: 18 19  Temp: 36.6 C 36.7 C  SpO2: 99% 100%    Last Pain:  Vitals:   11/01/18 0300  TempSrc:   PainSc: Asleep                 Montez Hageman

## 2018-11-01 NOTE — Progress Notes (Signed)
   Subjective: 1 Day Post-Op Procedure(s) (LRB): INTRAMEDULLARY (IM) NAIL INTERTROCHANTRIC (Left) Patient reports pain as mild.   Patient seen in rounds for Dr. Lyla Glassing.  Patient is well, and has had no acute complaints or problems other than discomfort in the left hip. No acute events overnight. Patient sitting up in bed eating breakfast on exam. Foley catheter in place. Patient denies CP, SHOB, N/V.  We will start therapy today.   Objective: Vital signs in last 24 hours: Temp:  [97.6 F (36.4 C)-98.2 F (36.8 C)] 98.2 F (36.8 C) (10/24 0607) Pulse Rate:  [69-91] 81 (10/24 0607) Resp:  [12-25] 16 (10/24 0607) BP: (118-176)/(47-101) 136/59 (10/24 0607) SpO2:  [88 %-100 %] 98 % (10/24 0607) Weight:  [70.8 kg-71.4 kg] 71.4 kg (10/23 1113)  Intake/Output from previous day:  Intake/Output Summary (Last 24 hours) at 11/01/2018 0809 Last data filed at 11/01/2018 0625 Gross per 24 hour  Intake 2523.68 ml  Output 670 ml  Net 1853.68 ml     Intake/Output this shift: No intake/output data recorded.  Labs: Recent Labs    10/31/18 1200 11/01/18 0332  HGB 13.0 10.1*   Recent Labs    10/31/18 1200 11/01/18 0332  WBC 7.9 13.1*  RBC 4.04 3.10*  HCT 40.8 31.8*  PLT 339 243   Recent Labs    10/31/18 1200 11/01/18 0332  NA 138 135  K 4.7 5.1  CL 103 105  CO2 24 21*  BUN 22 19  CREATININE 0.67 0.63  GLUCOSE 122* 159*  CALCIUM 8.9 8.4*   Recent Labs    10/31/18 1200  INR 1.1    Exam: General - Patient is Alert and Oriented Extremity - Neurologically intact Sensation intact distally Intact pulses distally Dorsiflexion/Plantar flexion intact Dressing - dressing C/D/I Motor Function - intact, moving foot and toes well on exam.   Past Medical History:  Diagnosis Date  . Cancer [facility])    breast - right  . Hypertension   . Pneumonia     Assessment/Plan: 1 Day Post-Op Procedure(s) (LRB): INTRAMEDULLARY (IM) NAIL INTERTROCHANTRIC (Left) Active Problems:  Hip fracture (HCC)   Accelerated hypertension  Estimated body mass index is 27.02 kg/m as calculated from the following:   Height as of this encounter: 5\' 4"  (1.626 m).   Weight as of this encounter: 71.4 kg. Advance diet Up with therapy  DVT Prophylaxis - Lovenox ,then transition to ASA 81 mg BID at d/c Weight bearing as tolerated LLE D/C O2 and pulse ox and try on room air.   Patient is doing well this morning. She states she went to some type of skilled nursing facility after her right hip fracture last year. Disposition per medicine team. Patient to get up with therapy today. Continue pain management.   Griffith Citron, PA-C Orthopedic Surgery [phone] 11/01/2018, 8:09 AM

## 2018-11-01 NOTE — Progress Notes (Signed)
PROGRESS NOTE    [name]  OEV:[id number] DOB: 28-Sep-1922 DOA: 10/31/2018 PCP: Lajean Manes, MD   Brief Narrative: [name] is a 85 y.o. female with medical history significant of htn, right hip fracture history of breast cancer admitted after a fall with complaints of left hip pain.  Patient at baseline walks with a walker she tripped on the rug and fell landing on her left hip.  She was unable to bear weight on the left leg.  X-ray done in the emergency department showed acute left intertrochanteric fracture.  Orthopedics consulted and she underwent ORIF.  PT/OT evaluation requested.  Assessment & Plan:   Active Problems:   Hip fracture (HCC)   Accelerated hypertension   Left intertrochanteric hip fracture: Status post ORIF.  Postop day 1.  Ordered PT/OT starting from today.  Continue DVT prophylaxis with Lovenox .Orthopedics recommending to discharge her on aspirin 81 mg twice daily.  Pain management.  Bowel regimen. She will follow-up with orthopedics as an outpatient in 2 weeks. She is from an independent living apartment.  Lives with her husband.  Needs rehab on discharge.  Social worker consulted. Follow-up vitamin D level.  History of hypertension: Continue Norvasc.  Current blood pressure stable  Macrocytosis: Vitamin B12 and folate level normal.  Leukocytosis :Most likely reactive.  No indication for antibiotic therapy.         DVT prophylaxis: Lovenox Code Status: DNR Family Communication: Called the husband for update Disposition Plan: Most likely skilled nursing facility.  PT/OT evaluation pending.   Consultants: Orthopedics  Procedures: Intramedullary nailing  Antimicrobials:  Anti-infectives (From admission, onward)   Start     Dose/Rate Route Frequency Ordered Stop   11/01/18 0600  ceFAZolin (ANCEF) IVPB 2g/100 mL premix     2 g 200 mL/hr over 30 Minutes Intravenous On call to O.R. 10/31/18 1901 10/31/18 1930   10/31/18 1847   ceFAZolin (ANCEF) 2-4 GM/100ML-% IVPB    Note to Pharmacy: Darlys Gales   : cabinet override      10/31/18 1847 10/31/18 1920      Subjective:  Patient seen and examined the bedside this morning.  Hemodynamically stable.  Looks comfortable but complains of some pain on the surgical site and was requesting for pain medicine.  Denies any complaints  Objective: Vitals:   10/31/18 2254 10/31/18 2356 11/01/18 0048 11/01/18 0607  BP: (!) 132/57 (!) 139/53 (!) 136/47 (!) 136/59  Pulse: 81 75 76 81  Resp: 16 18 19 16   Temp: 97.8 F (36.6 C) 97.9 F (36.6 C) 98 F (36.7 C) 98.2 F (36.8 C)  TempSrc: Oral Oral Oral Oral  SpO2: 100% 99% 100% 98%  Weight:      Height:        Intake/Output Summary (Last 24 hours) at 11/01/2018 1328 Last data filed at 11/01/2018 0840 Gross per 24 hour  Intake 2763.68 ml  Output 670 ml  Net 2093.68 ml   Filed Weights   10/31/18 1112 10/31/18 1113  Weight: 70.8 kg 71.4 kg    Examination:  General exam: Not in distress, very pleasant elderly female HEENT:PERRL,Oral mucosa moist, Ear/Nose normal on gross exam Respiratory system: Bilateral equal air entry, normal vesicular breath sounds, no wheezes or crackles  Cardiovascular system: S1 & S2 heard, RRR. No JVD, murmurs, rubs, gallops or clicks. No pedal edema. Gastrointestinal system: Abdomen is nondistended, soft and nontender. No organomegaly or masses felt. Normal bowel sounds heard. Central nervous system: Alert and oriented. No focal neurological deficits.  Extremities: Edema of the left thigh, tenderness of the left thigh, clean surgical wound  Data Reviewed: I have personally reviewed following labs and imaging studies  CBC: Recent Labs  Lab 10/31/18 1200 11/01/18 0332  WBC 7.9 13.1*  NEUTROABS 5.0  --   HGB 13.0 10.1*  HCT 40.8 31.8*  MCV 101.0* 102.6*  PLT 339 098   Basic Metabolic Panel: Recent Labs  Lab 10/31/18 1200 11/01/18 0332  NA 138 135  K 4.7 5.1  CL 103 105   CO2 24 21*  GLUCOSE 122* 159*  BUN 22 19  CREATININE 0.67 0.63  CALCIUM 8.9 8.4*  MG 2.2  --    GFR: Estimated Creatinine Clearance: 41.7 mL/min (by C-G formula based on SCr of 0.63 mg/dL). Liver Function Tests: Recent Labs  Lab 11/01/18 0332  AST 22  ALT 17  ALKPHOS 52  BILITOT 0.5  PROT 5.6*  ALBUMIN 3.2*   No results for input(s): LIPASE, AMYLASE in the last 168 hours. No results for input(s): AMMONIA in the last 168 hours. Coagulation Profile: Recent Labs  Lab 10/31/18 1200  INR 1.1   Cardiac Enzymes: No results for input(s): CKTOTAL, CKMB, CKMBINDEX, TROPONINI in the last 168 hours. BNP (last 3 results) No results for input(s): PROBNP in the last 8760 hours. HbA1C: No results for input(s): HGBA1C in the last 72 hours. CBG: No results for input(s): GLUCAP in the last 168 hours. Lipid Profile: No results for input(s): CHOL, HDL, LDLCALC, TRIG, CHOLHDL, LDLDIRECT in the last 72 hours. Thyroid Function Tests: No results for input(s): TSH, T4TOTAL, FREET4, T3FREE, THYROIDAB in the last 72 hours. Anemia Panel: Recent Labs    11/01/18 0332  VITAMINB12 359  FOLATE 16.6   Sepsis Labs: No results for input(s): PROCALCITON, LATICACIDVEN in the last 168 hours.  Recent Results (from the past 240 hour(s))  SARS Coronavirus 2 by RT PCR (hospital order, performed in [facility] hospital lab) Nasopharyngeal Nasopharyngeal Swab     Status: None   Collection Time: 10/31/18  3:41 PM   Specimen: Nasopharyngeal Swab  Result Value Ref Range Status   SARS Coronavirus 2 NEGATIVE NEGATIVE Final    Comment: (NOTE) If result is NEGATIVE SARS-CoV-2 target nucleic acids are NOT DETECTED. The SARS-CoV-2 RNA is generally detectable in upper and lower  respiratory specimens during the acute phase of infection. The lowest  concentration of SARS-CoV-2 viral copies this assay can detect is 250  copies / mL. A negative result does not preclude SARS-CoV-2 infection  and should not be  used as the sole basis for treatment or other  patient management decisions.  A negative result may occur with  improper specimen collection / handling, submission of specimen other  than nasopharyngeal swab, presence of viral mutation(s) within the  areas targeted by this assay, and inadequate number of viral copies  (<250 copies / mL). A negative result must be combined with clinical  observations, patient history, and epidemiological information. If result is POSITIVE SARS-CoV-2 target nucleic acids are DETECTED. The SARS-CoV-2 RNA is generally detectable in upper and lower  respiratory specimens dur ing the acute phase of infection.  Positive  results are indicative of active infection with SARS-CoV-2.  Clinical  correlation with patient history and other diagnostic information is  necessary to determine patient infection status.  Positive results do  not rule out bacterial infection or co-infection with other viruses. If result is PRESUMPTIVE POSTIVE SARS-CoV-2 nucleic acids MAY BE PRESENT.   A presumptive positive result was  obtained on the submitted specimen  and confirmed on repeat testing.  While 2019 novel coronavirus  (SARS-CoV-2) nucleic acids may be present in the submitted sample  additional confirmatory testing may be necessary for epidemiological  and / or clinical management purposes  to differentiate between  SARS-CoV-2 and other Sarbecovirus currently known to infect humans.  If clinically indicated additional testing with an alternate test  methodology [phone]) is advised. The SARS-CoV-2 RNA is generally  detectable in upper and lower respiratory sp ecimens during the acute  phase of infection. The expected result is Negative. Fact Sheet for Patients:  StrictlyIdeas.no Fact Sheet for Healthcare Providers: BankingDealers.co.za This test is not yet approved or cleared by the [place] FDA and has been authorized  for detection and/or diagnosis of SARS-CoV-2 by FDA under an Emergency Use Authorization (EUA).  This EUA will remain in effect (meaning this test can be used) for the duration of the COVID-19 declaration under Section 564(b)(1) of the Act, 21 U.S.C. section 360bbb-3(b)(1), unless the authorization is terminated or revoked sooner. Performed at [facility], [place] [address] [place], [place] [address]   Surgical pcr screen     Status: None   Collection Time: 10/31/18  6:49 PM   Specimen: Nasal Mucosa; Nasal Swab  Result Value Ref Range Status   MRSA, PCR NEGATIVE NEGATIVE Final   Staphylococcus aureus NEGATIVE NEGATIVE Final    Comment: (NOTE) The Xpert SA Assay (FDA approved for NASAL specimens in patients 27 years of age and older), is one component of a comprehensive surveillance program. It is not intended to diagnose infection nor to guide or monitor treatment. Performed at [facility], [place] [address], [place], [place] [address]          Radiology Studies: Dg Chest Port 1 View  Result Date: 10/31/2018 CLINICAL DATA:  Fall this morning. Left hip pain. Some shortness of breath. EXAM: PORTABLE CHEST 1 VIEW COMPARISON:  07/09/2018. FINDINGS: Cardiac silhouette normal in size. No mediastinal or hilar masses or evidence of adenopathy. Clear lungs.  No pleural effusion or pneumothorax. Skeletal structures are grossly intact. IMPRESSION: No active disease. Electronically Signed   By: Lajean Manes M.D.   On: 10/31/2018 13:11   Dg C-arm 1-60 Min-no Report  Result Date: 10/31/2018 Fluoroscopy was utilized by the requesting physician.  No radiographic interpretation.   Dg Hip Unilat With Pelvis 2-3 Views Left  Result Date: 10/31/2018 CLINICAL DATA:  Left hip pain after trip and fall this morning. Initial encounter. EXAM: DG HIP (WITH OR WITHOUT PELVIS) 2-3V LEFT COMPARISON:  Plain films right hip 07/09/2018. FINDINGS: The patient has an acute  left intertrochanteric fracture. Hip screw and short intramedullary nail fix the right intertrochanteric fracture seen on the prior examination. The fracture appears to be a partial union. The exam is otherwise negative. IMPRESSION: Acute left intertrochanteric fracture. Status post fixation of a right intertrochanteric fracture which appears to be a partial union. Electronically Signed   By: Inge Rise M.D.   On: 10/31/2018 13:10   Dg Femur 1v Left  Result Date: 10/31/2018 CLINICAL DATA:  IM nail left hip EXAM: LEFT FEMUR 1 VIEW COMPARISON:  10/31/2018 FINDINGS: Two low resolution intraoperative spot views of the left hip. Total fluoroscopy time was 1 minutes 10 seconds. There is intramedullary rod and screw fixation of the left femur for comminuted intertrochanteric fracture. IMPRESSION: Intraoperative fluoroscopic assistance provided during surgical fixation of proximal left femoral fracture Electronically Signed   By: Maudie Mercury  Francoise Ceo M.D.   On: 10/31/2018 20:51        Scheduled Meds: . acetaminophen  650 mg Oral TID  . amLODipine  2.5 mg Oral Daily  . docusate sodium  200 mg Oral Daily  . meclizine  25 mg Oral Daily   Continuous Infusions: . lactated ringers 10 mL/hr at 10/31/18 1848     LOS: 1 day    Time spent: 25 mins.More than 50% of that time was spent in counseling and/or coordination of care.      Shelly Coss, MD [facility] Pager [phone]  If 7PM-7AM, please contact night-coverage [email or web address] Password [email or web address] 11/01/2018, 1:28 PM

## 2018-11-01 NOTE — Evaluation (Signed)
Physical Therapy Evaluation Patient Details Name: [name] MRN: [id number] DOB: 1922-01-23 Today's Date: 11/01/2018   History of Present Illness  Pt s/p fall with L hip fx and now s/p IM nailing.  Pt with hx of breast CA, "birth injury" limiting use of R UE and R hip fx s/p IM nailing 7/20  Clinical Impression  Pt admitted as above and presenting with functional mobility limitations 2* generalized weakness/deconditioniing, decreased L LE strength/ROM, and post op pain limiting functional mobility.  Pt would benefit from follow up rehab at SNF level to maximize IND and safety prior to return home to IND living setting with 55 yr old spouse.    Follow Up Recommendations SNF    Equipment Recommendations  None recommended by PT    Recommendations for Other Services       Precautions / Restrictions Precautions Precautions: Fall Restrictions Weight Bearing Restrictions: No      Mobility  Bed Mobility Overal bed mobility: Needs Assistance Bed Mobility: Supine to Sit;Sit to Supine     Supine to sit: Mod assist;+2 for physical assistance;+2 for safety/equipment Sit to supine: Mod assist;Max assist;+2 for physical assistance;+2 for safety/equipment   General bed mobility comments: Increased time with cues for sequence; physical assist to manage LEs, to manage trunk and to complete transition to/from EOB using pad  Transfers Overall transfer level: Needs assistance Equipment used: Rolling walker (2 wheeled) Transfers: Sit to/from Stand Sit to Stand: Mod assist;+2 physical assistance;+2 safety/equipment;From elevated surface         General transfer comment: cues for LE management and use of UEs to self assist;  Physical assist to bring wt up and fwd and to balance in standing  Ambulation/Gait Ambulation/Gait assistance: Max assist;+2 physical assistance;+2 safety/equipment Gait Distance (Feet): 2 Feet Assistive device: Rolling walker (2 wheeled) Gait  Pattern/deviations: Step-to pattern;Decreased step length - right;Decreased step length - left;Shuffle;Trunk flexed Gait velocity: decr   General Gait Details: cues for sequence, posture, position from RW and increased UE WB; Pt able to take several small steps with assist but then buckling and requiring max assist of 2 to prevent fall.  Bed pulled up behind pt and pt assisted to sit back on EOB  Stairs            Wheelchair Mobility    Modified Rankin (Stroke Patients Only)       Balance Overall balance assessment: Needs assistance Sitting-balance support: Bilateral upper extremity supported;Feet supported Sitting balance-Leahy Scale: Fair     Standing balance support: Bilateral upper extremity supported Standing balance-Leahy Scale: Poor                               Pertinent Vitals/Pain Pain Assessment: 0-10 Pain Score: 6  Pain Location: L hip with movement and WB Pain Descriptors / Indicators: Aching;Sore Pain Intervention(s): Limited activity within patient's tolerance;Monitored during session;Premedicated before session(pt declines ice packs)    Home Living Family/patient expects to be discharged to:: Skilled nursing facility                 Additional Comments: pt  is from [facility]    Prior Function Level of Independence: Independent with assistive device(s)         Comments: ambulated with RW     Hand Dominance        Extremity/Trunk Assessment   Upper Extremity Assessment Upper Extremity Assessment: RUE deficits/detail;Generalized weakness RUE Deficits / Details: limited  shoulder ROM since birth    Lower Extremity Assessment Lower Extremity Assessment: Generalized weakness;LLE deficits/detail LLE Deficits / Details: 2/5 strength at hip with AAROM at hip to 70 flex and 15 abd    Cervical / Trunk Assessment Cervical / Trunk Assessment: Kyphotic  Communication   Communication: No difficulties  Cognition  Arousal/Alertness: Awake/alert Behavior During Therapy: WFL for tasks assessed/performed Overall Cognitive Status: Within Functional Limits for tasks assessed                                        General Comments      Exercises General Exercises - Lower Extremity Ankle Circles/Pumps: AROM;Both;15 reps;Supine Quad Sets: AROM;Both;10 reps;Supine Heel Slides: AAROM;Left;15 reps;Supine Hip ABduction/ADduction: AAROM;Left;10 reps;Supine   Assessment/Plan    PT Assessment Patient needs continued PT services  PT Problem List Decreased strength;Decreased range of motion;Decreased activity tolerance;Decreased balance;Decreased mobility;Decreased knowledge of use of DME;Pain       PT Treatment Interventions DME instruction;Gait training;Functional mobility training;Therapeutic activities;Therapeutic exercise;Balance training;Patient/family education    PT Goals (Current goals can be found in the Care Plan section)  Acute Rehab PT Goals Patient Stated Goal: Regain IND PT Goal Formulation: With patient Time For Goal Achievement: 11/15/18 Potential to Achieve Goals: Fair    Frequency Min 3X/week   Barriers to discharge        Co-evaluation               AM-PAC PT "6 Clicks" Mobility  Outcome Measure Help needed turning from your back to your side while in a flat bed without using bedrails?: A Lot Help needed moving from lying on your back to sitting on the side of a flat bed without using bedrails?: A Lot Help needed moving to and from a bed to a chair (including a wheelchair)?: A Lot Help needed standing up from a chair using your arms (e.g., wheelchair or bedside chair)?: A Lot Help needed to walk in hospital room?: A Lot Help needed climbing 3-5 steps with a railing? : Total 6 Click Score: 11    End of Session Equipment Utilized During Treatment: Gait belt Activity Tolerance: Patient limited by fatigue;Patient limited by pain Patient left: in  bed;with call bell/phone within reach;with bed alarm set Nurse Communication: Mobility status PT Visit Diagnosis: Difficulty in walking, not elsewhere classified (R26.2);Muscle weakness (generalized) (M62.81)    Time: 5035-4656 PT Time Calculation (min) (ACUTE ONLY): 29 min   Charges:   PT Evaluation $PT Eval Low Complexity: 1 Low PT Treatments $Therapeutic Exercise: 8-22 mins        Debe Coder PT Acute Rehabilitation Services Pager [phone] Office [phone]   Bernell Haynie 11/01/2018, 5:35 PM

## 2018-11-02 DIAGNOSIS — I10 Essential (primary) hypertension: Secondary | ICD-10-CM

## 2018-11-02 DIAGNOSIS — S72002A Fracture of unspecified part of neck of left femur, initial encounter for closed fracture: Secondary | ICD-10-CM | POA: Diagnosis not present

## 2018-11-02 LAB — CBC WITH DIFFERENTIAL/PLATELET
Abs Immature Granulocytes: 0.03 10*3/uL (ref 0.00–0.07)
Basophils Absolute: 0 10*3/uL (ref 0.0–0.1)
Basophils Relative: 0 %
Eosinophils Absolute: 0 10*3/uL (ref 0.0–0.5)
Eosinophils Relative: 0 %
HCT: 26 % — ABNORMAL LOW (ref 36.0–46.0)
Hemoglobin: 8.1 g/dL — ABNORMAL LOW (ref 12.0–15.0)
Immature Granulocytes: 0 %
Lymphocytes Relative: 25 %
Lymphs Abs: 2.1 10*3/uL (ref 0.7–4.0)
MCH: 32 pg (ref 26.0–34.0)
MCHC: 31.2 g/dL (ref 30.0–36.0)
MCV: 102.8 fL — ABNORMAL HIGH (ref 80.0–100.0)
Monocytes Absolute: 1.1 10*3/uL — ABNORMAL HIGH (ref 0.1–1.0)
Monocytes Relative: 13 %
Neutro Abs: 5.1 10*3/uL (ref 1.7–7.7)
Neutrophils Relative %: 62 %
Platelets: 218 10*3/uL (ref 150–400)
RBC: 2.53 MIL/uL — ABNORMAL LOW (ref 3.87–5.11)
RDW: 14.9 % (ref 11.5–15.5)
WBC: 8.3 10*3/uL (ref 4.0–10.5)
nRBC: 0 % (ref 0.0–0.2)

## 2018-11-02 LAB — SARS CORONAVIRUS 2 (TAT 6-24 HRS): SARS Coronavirus 2: NEGATIVE

## 2018-11-02 NOTE — Progress Notes (Signed)
Pt stable at this time. Pt reports moderate pain with movement. No signs of distress. rn will continue to monitor.

## 2018-11-02 NOTE — Evaluation (Signed)
Occupational Therapy Evaluation Patient Details Name: [name] MRN: [id number] DOB: [birth year] Today's Date: 11/02/2018    History of Present Illness Pt s/p fall with L hip fx and now s/p IM nailing.  Pt with hx of breast CA, "birth injury" limiting use of R UE and R hip fx s/p IM nailing 7/20   Clinical Impression   Pt admitted with a above diagnoses, presenting with post sx pain and weakness limiting ability to engage in BADL at desired level of independence. PTA pt living at [place] with husband of 3 years. She used a RW but was able to compete BADL at mod I. At time of eval, pt is min/mod A+2 for sit <> stands and max A +2 for functional mobility with RW. Pt requires heavy reliance on external support to maintain balance so ultimately needing total A for pericare at this time. Pt fatiguing quickly and needing bed moved up behind her to sit after pivoting from chair. At this time recommending SNF level of care for safe progression of BADLs prior to d/c home. Pt is able to do this at same facility in which she resides. Will continue to follow per POC listed below.    Follow Up Recommendations  SNF;Supervision/Assistance - 24 hour;Other (comment)(higher level of care at current facility)    Equipment Recommendations  None recommended by OT    Recommendations for Other Services       Precautions / Restrictions Precautions Precautions: Fall Restrictions Weight Bearing Restrictions: No Other Position/Activity Restrictions: WBAT      Mobility Bed Mobility               General bed mobility comments: up in chair  Transfers Overall transfer level: Needs assistance Equipment used: Rolling walker (2 wheeled) Transfers: Sit to/from Stand Sit to Stand: Min assist;Mod assist;+2 physical assistance;+2 safety/equipment;From elevated surface         General transfer comment: ale to initiate transer, but ultimately needing min-mod A +2 for safety and steadying up  with RW. cues needed for sequencing    Balance Overall balance assessment: Needs assistance Sitting-balance support: Bilateral upper extremity supported;Feet supported Sitting balance-Leahy Scale: Fair     Standing balance support: Bilateral upper extremity supported Standing balance-Leahy Scale: Poor                             ADL either performed or assessed with clinical judgement   ADL Overall ADL's : Needs assistance/impaired Eating/Feeding: Set up;Sitting   Grooming: Set up;Sitting   Upper Body Bathing: Minimal assistance;Sitting   Lower Body Bathing: Maximal assistance;Sitting/lateral leans;Sit to/from stand   Upper Body Dressing : Minimal assistance;Sitting   Lower Body Dressing: Maximal assistance;Sitting/lateral leans;Sit to/from stand   Toilet Transfer: Maximal assistance;+2 for safety/equipment;+2 for physical assistance;BSC;Stand-pivot;RW   Toileting- Clothing Manipulation and Hygiene: Total assistance;Sit to/from stand Toileting - Clothing Manipulation Details (indicate cue type and reason): standing in front of recliner to clean bottom     Functional mobility during ADLs: Maximal assistance;+2 for physical assistance;+2 for safety/equipment General ADL Comments: ltd 2/2 generalized weakness and post sx pain and weakness     Vision Baseline Vision/History: Wears glasses Wears Glasses: At all times Patient Visual Report: No change from baseline Vision Assessment?: No apparent visual deficits     Perception     Praxis      Pertinent Vitals/Pain Pain Assessment: Faces Faces Pain Scale: Hurts little more Pain Location: L hip with movement  and WB Pain Descriptors / Indicators: Aching;Sore Pain Intervention(s): Limited activity within patient's tolerance;Monitored during session;Premedicated before session;Repositioned     Hand Dominance     Extremity/Trunk Assessment Upper Extremity Assessment Upper Extremity Assessment: Generalized  weakness;RUE deficits/detail RUE Deficits / Details: limited shoulder ROM since birth, reports she has never really had functional use of this extremity   Lower Extremity Assessment Lower Extremity Assessment: Defer to PT evaluation       Communication Communication Communication: No difficulties   Cognition Arousal/Alertness: Awake/alert Behavior During Therapy: WFL for tasks assessed/performed Overall Cognitive Status: Within Functional Limits for tasks assessed                                     General Comments       Exercises     Shoulder Instructions      Home Living Family/patient expects to be discharged to:: Skilled nursing facility                                 Additional Comments: originally from [facility]      Prior Functioning/Environment Level of Independence: Independent with assistive device(s)        Comments: used RW, able to complete BADLs        OT Problem List: Decreased strength;Decreased knowledge of use of DME or AE;Decreased knowledge of precautions;Decreased activity tolerance;Impaired balance (sitting and/or standing);Pain      OT Treatment/Interventions: Self-care/ADL training;Therapeutic exercise;Patient/family education;Balance training;Therapeutic activities;DME and/or AE instruction    OT Goals(Current goals can be found in the care plan section) Acute Rehab OT Goals Patient Stated Goal: move and feel better OT Goal Formulation: With patient Time For Goal Achievement: 11/16/18 Potential to Achieve Goals: Good  OT Frequency: Min 2X/week   Barriers to D/C:            Co-evaluation PT/OT/SLP Co-Evaluation/Treatment: Yes Reason for Co-Treatment: For patient/therapist safety;To address functional/ADL transfers PT goals addressed during session: Mobility/safety with mobility;Proper use of DME;Strengthening/ROM OT goals addressed during session: ADL's and self-care;Proper use of  Adaptive equipment and DME      AM-PAC OT "6 Clicks" Daily Activity     Outcome Measure Help from another person eating meals?: A Little Help from another person taking care of personal grooming?: A Little Help from another person toileting, which includes using toliet, bedpan, or urinal?: A Lot Help from another person bathing (including washing, rinsing, drying)?: A Lot Help from another person to put on and taking off regular upper body clothing?: A Little Help from another person to put on and taking off regular lower body clothing?: A Lot 6 Click Score: 15   End of Session Equipment Utilized During Treatment: Gait belt;Rolling walker Nurse Communication: Mobility status  Activity Tolerance: Patient tolerated treatment well Patient left: in bed;with call bell/phone within reach;with bed alarm set  OT Visit Diagnosis: Unsteadiness on feet (R26.81);Other abnormalities of gait and mobility (R26.89);Muscle weakness (generalized) (M62.81);History of falling (Z91.81);Pain Pain - Right/Left: Left Pain - part of body: Hip                Time: 1411-1437 OT Time Calculation (min): 26 min Charges:  OT General Charges $OT Visit: 1 Visit OT Evaluation $OT Eval Moderate Complexity: 1 Mod  Zenovia Jarred, MSOT, OTR/L [employer] OT/ Acute Relief OT [facility] Office: [place]  11/02/2018, 3:07 PM

## 2018-11-02 NOTE — Progress Notes (Signed)
Physical Therapy Treatment Patient Details Name: [name] MRN: [id number] DOB: 01-Aug-1922 Today's Date: 11/02/2018    History of Present Illness Pt s/p fall with L hip fx and now s/p IM nailing.  Pt with hx of breast CA, "birth injury" limiting use of R UE and R hip fx s/p IM nailing 7/20    PT Comments    Pt continues very motivated but mobility limited by low WB tolerance on L LE and poor UE strength limiting ability to compensate.   Follow Up Recommendations  SNF     Equipment Recommendations  None recommended by PT    Recommendations for Other Services       Precautions / Restrictions Precautions Precautions: Fall Restrictions Weight Bearing Restrictions: No Other Position/Activity Restrictions: WBAT    Mobility  Bed Mobility Overal bed mobility: Needs Assistance Bed Mobility: Supine to Sit     Supine to sit: +2 for physical assistance;+2 for safety/equipment;Min assist;Mod assist     General bed mobility comments: up in chair  Transfers Overall transfer level: Needs assistance Equipment used: Rolling walker (2 wheeled) Transfers: Sit to/from Stand Sit to Stand: Min assist;Mod assist;+2 physical assistance;+2 safety/equipment;From elevated surface         General transfer comment: ale to initiate transer, but ultimately needing min-mod A +2 for safety and steadying up with RW. cues needed for sequencing  Ambulation/Gait Ambulation/Gait assistance: Max assist;+2 physical assistance;+2 safety/equipment Gait Distance (Feet): 3 Feet Assistive device: Rolling walker (2 wheeled) Gait Pattern/deviations: Step-to pattern;Decreased step length - right;Decreased step length - left;Shuffle;Trunk flexed Gait velocity: decr   General Gait Details: cues for sequence, posture, position from RW and increased UE WB; pt able to take several steps with control over balance and L LE tolerating WB but pt buckling with further attempts and assisted to  sit   Stairs             Wheelchair Mobility    Modified Rankin (Stroke Patients Only)       Balance Overall balance assessment: Needs assistance Sitting-balance support: Bilateral upper extremity supported;Feet supported Sitting balance-Leahy Scale: Fair     Standing balance support: Bilateral upper extremity supported Standing balance-Leahy Scale: Poor                              Cognition Arousal/Alertness: Awake/alert Behavior During Therapy: WFL for tasks assessed/performed Overall Cognitive Status: Within Functional Limits for tasks assessed                                        Exercises General Exercises - Lower Extremity Ankle Circles/Pumps: AROM;Both;15 reps;Supine Quad Sets: AROM;Both;10 reps;Supine Heel Slides: AAROM;Left;15 reps;Supine Hip ABduction/ADduction: AAROM;Left;10 reps;Supine    General Comments        Pertinent Vitals/Pain Pain Assessment: Faces Pain Score: 5  Faces Pain Scale: Hurts little more Pain Location: L hip with movement and WB Pain Descriptors / Indicators: Aching;Sore Pain Intervention(s): Limited activity within patient's tolerance;Monitored during session;Premedicated before session;Repositioned    Home Living Family/patient expects to be discharged to:: Skilled nursing facility               Additional Comments: originally from [facility]    Prior Function Level of Independence: Independent with assistive device(s)      Comments: used RW, able to complete BADLs   PT Goals (current  goals can now be found in the care plan section) Acute Rehab PT Goals Patient Stated Goal: move and feel better PT Goal Formulation: With patient Time For Goal Achievement: 11/15/18 Potential to Achieve Goals: Fair Progress towards PT goals: Progressing toward goals    Frequency    Min 3X/week      PT Plan Current plan remains appropriate    Co-evaluation   Reason  for Co-Treatment: For patient/therapist safety;To address functional/ADL transfers PT goals addressed during session: Mobility/safety with mobility;Proper use of DME;Strengthening/ROM OT goals addressed during session: ADL's and self-care;Proper use of Adaptive equipment and DME      AM-PAC PT "6 Clicks" Mobility   Outcome Measure  Help needed turning from your back to your side while in a flat bed without using bedrails?: A Lot Help needed moving from lying on your back to sitting on the side of a flat bed without using bedrails?: A Lot Help needed moving to and from a bed to a chair (including a wheelchair)?: A Lot Help needed standing up from a chair using your arms (e.g., wheelchair or bedside chair)?: A Lot Help needed to walk in hospital room?: A Lot Help needed climbing 3-5 steps with a railing? : Total 6 Click Score: 11    End of Session Equipment Utilized During Treatment: Gait belt Activity Tolerance: Patient limited by fatigue;Patient limited by pain Patient left: in bed;with call bell/phone within reach;with bed alarm set Nurse Communication: Mobility status PT Visit Diagnosis: Difficulty in walking, not elsewhere classified (R26.2);Muscle weakness (generalized) (M62.81)     Time: 1561-5379 PT Time Calculation (min) (ACUTE ONLY): 25 min  Charges:  $Gait Training: 8-22 mins $Therapeutic Exercise: 8-22 mins                     [place] Pager [phone] Office [phone]    Toren Tucholski 11/02/2018, 5:25 PM

## 2018-11-02 NOTE — Progress Notes (Signed)
Subjective: 2 Days Post-Op Procedure(s) (LRB): INTRAMEDULLARY (IM) NAIL INTERTROCHANTRIC (Left)  Patient reports pain as mild to moderate.  Tolerating POs well.  Admits to flatus.  Reports that she has been working well with therapy.  Objective:   VITALS:  Temp:  [97.9 F (36.6 C)-98.1 F (36.7 C)] 97.9 F (36.6 C) (10/25 0455) Pulse Rate:  [76-77] 76 (10/25 0816) Resp:  [14-18] 14 (10/25 0455) BP: (131-142)/(45-70) 131/70 (10/25 0816) SpO2:  [95 %-97 %] 95 % (10/25 0455)  General: WDWN patient in NAD. Psych:  Appropriate mood and affect. Neuro:  A&O x 3, Moving all extremities, sensation intact to light touch HEENT:  EOMs intact Chest:  Even non-labored respirations Skin:  Dressing C/D/I, no rashes or lesions Extremities: warm/dry, mild edema, no erythema or echymosis.  No lymphadenopathy. Pulses: Popliteus 2+ MSK:  ROM: TKE, MMT: able to perform quad set, (-) Homan's    LABS Recent Labs    10/31/18 1200 11/01/18 0332 11/02/18 0251  HGB 13.0 10.1* 8.1*  WBC 7.9 13.1* 8.3  PLT 339 243 218   Recent Labs    10/31/18 1200 11/01/18 0332  NA 138 135  K 4.7 5.1  CL 103 105  CO2 24 21*  BUN 22 19  CREATININE 0.67 0.63  GLUCOSE 122* 159*   Recent Labs    10/31/18 1200  INR 1.1     Assessment/Plan: 2 Days Post-Op Procedure(s) (LRB): INTRAMEDULLARY (IM) NAIL INTERTROCHANTRIC (Left)  Patient seen in rounds for Dr. Lyla Glassing. WBAT LLE Up with therapy DVT ppx: lovenox with transition to 81 mg ASA BID. Disp: SNF likely  Mechele Claude PA-C [facility] Office:  [phone]

## 2018-11-02 NOTE — Progress Notes (Signed)
Physical Therapy Treatment Patient Details Name: [name] MRN: [id number] DOB: June 16, 1922 Today's Date: 11/02/2018    History of Present Illness Pt s/p fall with L hip fx and now s/p IM nailing.  Pt with hx of breast CA, "birth injury" limiting use of R UE and R hip fx s/p IM nailing 7/20    PT Comments    Pt continues motivated but limited by low WB tolerance on L LE and limited UE strength to compensate - pt buckling this pm with attempts to ambulate and requiring significant assist to avoid collapsing.  Pt would benefit from follow up rehab at SNF level to maximize IND and safety prior to return home with 32 yr old spouse.   Follow Up Recommendations  SNF     Equipment Recommendations  None recommended by PT    Recommendations for Other Services       Precautions / Restrictions Precautions Precautions: Fall Restrictions Weight Bearing Restrictions: No Other Position/Activity Restrictions: WBAT    Mobility  Bed Mobility Overal bed mobility: Needs Assistance Bed Mobility: Sit to Supine       Sit to supine: Mod assist;Max assist;+2 for physical assistance;+2 for safety/equipment   General bed mobility comments: cues for sequence and physical assist to manage LEs and to control trunk  Transfers Overall transfer level: Needs assistance Equipment used: Rolling walker (2 wheeled) Transfers: Sit to/from Stand Sit to Stand: Min assist;Mod assist;+2 physical assistance;+2 safety/equipment;From elevated surface         General transfer comment: able to initiate transer, but ultimately needing min-mod A +2 for safety and steadying up with RW. cues needed for sequencing  Ambulation/Gait Ambulation/Gait assistance: Max assist;+2 physical assistance;+2 safety/equipment Gait Distance (Feet): 1 Feet Assistive device: Rolling walker (2 wheeled) Gait Pattern/deviations: Step-to pattern;Decreased step length - right;Decreased step length - left;Shuffle;Trunk  flexed Gait velocity: decr   General Gait Details: cues for sequence, posture, position from RW and increased UE WB.  L LE tolerating limited WB and buckling, pt assisted to turn on R LE and bed pulled behind   Stairs             Wheelchair Mobility    Modified Rankin (Stroke Patients Only)       Balance Overall balance assessment: Needs assistance Sitting-balance support: Bilateral upper extremity supported;Feet supported Sitting balance-Leahy Scale: Fair     Standing balance support: Bilateral upper extremity supported Standing balance-Leahy Scale: Poor                              Cognition Arousal/Alertness: Awake/alert Behavior During Therapy: WFL for tasks assessed/performed Overall Cognitive Status: Within Functional Limits for tasks assessed                                        Exercises      General Comments        Pertinent Vitals/Pain Pain Assessment: Faces Faces Pain Scale: Hurts little more Pain Location: L hip with movement and WB Pain Descriptors / Indicators: Aching;Sore Pain Intervention(s): Monitored during session;Premedicated before session;Limited activity within patient's tolerance    Home Living Family/patient expects to be discharged to:: Skilled nursing facility               Additional Comments: originally from [facility]    Prior Function Level of Independence: Independent with assistive  device(s)      Comments: used RW, able to complete BADLs   PT Goals (current goals can now be found in the care plan section) Acute Rehab PT Goals Patient Stated Goal: move and feel better PT Goal Formulation: With patient Time For Goal Achievement: 11/15/18 Potential to Achieve Goals: Fair Progress towards PT goals: Progressing toward goals    Frequency    Min 3X/week      PT Plan Current plan remains appropriate    Co-evaluation PT/OT/SLP Co-Evaluation/Treatment:  Yes Reason for Co-Treatment: For patient/therapist safety;To address functional/ADL transfers PT goals addressed during session: Mobility/safety with mobility OT goals addressed during session: ADL's and self-care      AM-PAC PT "6 Clicks" Mobility   Outcome Measure  Help needed turning from your back to your side while in a flat bed without using bedrails?: A Lot Help needed moving from lying on your back to sitting on the side of a flat bed without using bedrails?: A Lot Help needed moving to and from a bed to a chair (including a wheelchair)?: A Lot Help needed standing up from a chair using your arms (e.g., wheelchair or bedside chair)?: A Lot Help needed to walk in hospital room?: A Lot Help needed climbing 3-5 steps with a railing? : Total 6 Click Score: 11    End of Session Equipment Utilized During Treatment: Gait belt Activity Tolerance: Patient limited by fatigue;Patient limited by pain Patient left: in bed;with call bell/phone within reach;with bed alarm set Nurse Communication: Mobility status PT Visit Diagnosis: Difficulty in walking, not elsewhere classified (R26.2);Muscle weakness (generalized) (M62.81)     Time: 1410-1433 PT Time Calculation (min) (ACUTE ONLY): 23 min  Charges:  $Therapeutic Activity: 8-22 mins                     [place] Pager [phone] Office [phone]    [name] 11/02/2018, 5:32 PM

## 2018-11-02 NOTE — TOC Initial Note (Signed)
Transition of Care [facility]) - Initial/Assessment Note    Patient Details  Name: [name] MRN: [id number] Date of Birth: Aug 15, 1922  Transition of Care [facility]) CM/SW Contact:    Wende Neighbors, LCSW Phone Number: 11/02/2018, 10:56 AM  Clinical Narrative:     Patient is from [facility] facility. CSW spoke with admission coordinator from [facility] and they stated that they have a bed available for patient once medically cleared. CSW spoke with patient and she is agreeable to go to the rehab side of [place]. Patient will need a negative covid test within 2 days and authorization through insurance prior to discharge          Expected Discharge Plan: [place] Barriers to Discharge: Ship broker, Continued Medical Work up   Patient Goals and CMS Choice Patient states their goals for this hospitalization and ongoing recovery are:: to be able to use her legs again CMS Medicare.gov Compare Post Acute Care list provided to:: Patient Choice offered to / list presented to : Patient  Expected Discharge Plan and Services Expected Discharge Plan: [place] In-house Referral: Clinical Social Work   Post Acute Care Choice: [place] Living arrangements for the past 2 months: [place]                                      Prior Living Arrangements/Services Living arrangements for the past 2 months: [place] Lives with:: Self Patient language and need for interpreter reviewed:: Yes        Need for Family Participation in Patient Care: Yes (Comment) Care giver support system in place?: Yes (comment)   Criminal Activity/Legal Involvement Pertinent to Current Situation/Hospitalization: No - Comment as needed  Activities of Daily Living Home Assistive Devices/Equipment: Eyeglasses, Grab bars around toilet, Grab bars in shower, Hand-held shower hose, Walker (specify  type), Dentures (specify type)(front wheeled walker, rollator, upper/lower dentures) ADL Screening (condition at time of admission) Patient's cognitive ability adequate to safely complete daily activities?: Yes Is the patient deaf or have difficulty hearing?: Yes(slight) Does the patient have difficulty seeing, even when wearing glasses/contacts?: No Does the patient have difficulty concentrating, remembering, or making decisions?: No Patient able to express need for assistance with ADLs?: Yes Does the patient have difficulty dressing or bathing?: Yes Independently performs ADLs?: No Communication: Independent Dressing (OT): Needs assistance Is this a change from baseline?: Change from baseline, expected to last >3 days Grooming: Needs assistance Is this a change from baseline?: Change from baseline, expected to last >3 days Feeding: Needs assistance Is this a change from baseline?: Change from baseline, expected to last >3 days Bathing: Needs assistance Is this a change from baseline?: Change from baseline, expected to last >3 days Toileting: Dependent Is this a change from baseline?: Change from baseline, expected to last >3days In/Out Bed: Dependent Is this a change from baseline?: Change from baseline, expected to last >3 days Walks in Home: Dependent Is this a change from baseline?: Change from baseline, expected to last >3 days Does the patient have difficulty walking or climbing stairs?: Yes(secondary to right hip fracture) Weakness of Legs: Right Weakness of Arms/Hands: None  Permission Sought/Granted   Permission granted to share information with : Yes, Verbal Permission Granted  Share Information with NAME: [name]     Permission granted to share info w Relationship: spouse  Permission granted to share info  w Contact Information: [phone]  Emotional Assessment Appearance:: Appears stated age Attitude/Demeanor/Rapport: Engaged Affect (typically observed):  Accepting Orientation: : Oriented to  Time, Oriented to Place, Oriented to Self, Oriented to Situation Alcohol / Substance Use: Not Applicable Psych Involvement: No (comment)  Admission diagnosis:  Closed fracture of left hip, initial encounter [facility]) [S72.002A] Patient Active Problem List   Diagnosis Date Noted  . Hip fracture ([place]) 10/31/2018  . Accelerated hypertension 10/31/2018  . Closed intertrochanteric fracture of hip, right, initial encounter ([place])   . Essential hypertension 07/09/2018  . AKI (acute kidney injury) ([place]) 07/09/2018  . Closed right hip fracture ([place]) 07/09/2018  . History of breast cancer 11/27/2011   PCP:  Lajean Manes, MD Pharmacy:   [facility], [place] [place] [address] Phone: [phone] Fax: [facility] [address] [place] [address] Phone: [phone] Fax: [phone]     Social Determinants of Health (SDOH) Interventions    Readmission Risk Interventions No flowsheet data found.

## 2018-11-02 NOTE — Plan of Care (Signed)
Continue current POC 

## 2018-11-02 NOTE — NC FL2 (Signed)
[place] LEVEL OF CARE SCREENING TOOL     IDENTIFICATION  Patient Name: [name] Birthdate: [birth year] Sex: female Admission Date (Current Location): 10/31/2018  [facility] and [place] Number:  Herbalist and Address:  [facility],  [place], [place]      Provider Number: [id number]  Attending Physician Name and Address:  Marcell Anger*  Relative Name and Phone Number:  Skyann Ganim [phone]    Current Level of Care: Hospital Recommended Level of Care: [place] Prior Approval Number:    Date Approved/Denied:   PASRR Number: [id number]  Discharge Plan: SNF    Current Diagnoses: Patient Active Problem List   Diagnosis Date Noted  . Hip fracture ([place]) 10/31/2018  . Accelerated hypertension 10/31/2018  . Closed intertrochanteric fracture of hip, right, initial encounter ([place])   . Essential hypertension 07/09/2018  . AKI (acute kidney injury) ([place]) 07/09/2018  . Closed right hip fracture ([place]) 07/09/2018  . History of breast cancer 11/27/2011    Orientation RESPIRATION BLADDER Height & Weight     Self, Time, Situation, Place  Normal Incontinent, Indwelling catheter Weight: 157 lb 6.5 oz (71.4 kg) Height:  5\' 4"  (162.6 cm)  BEHAVIORAL SYMPTOMS/MOOD NEUROLOGICAL BOWEL NUTRITION STATUS      Continent Diet(heart healthy)  AMBULATORY STATUS COMMUNICATION OF NEEDS Skin   Extensive Assist Verbally Surgical wounds, Bruising(L hip fracture)                       Personal Care Assistance Level of Assistance  Bathing, Feeding, Dressing Bathing Assistance: Limited assistance Feeding assistance: Independent Dressing Assistance: Limited assistance     Functional Limitations Info  Sight, Hearing, Speech Sight Info: Impaired(glasses) Hearing Info: Adequate Speech Info: Adequate    SPECIAL CARE FACTORS FREQUENCY  PT (By licensed PT), OT (By licensed OT)     PT Frequency: 5x  wk OT Frequency: 5x wk            Contractures Contractures Info: Not present    Additional Factors Info  Code Status, Allergies Code Status Info: DNR Allergies Info: Macrobid (Nitrofurantoin Macrocrystal) Other           Current Medications (11/02/2018):  This is the current hospital active medication list Current Facility-Administered Medications  Medication Dose Route Frequency Provider Last Rate Last Dose  . acetaminophen (TYLENOL) tablet 650 mg  650 mg Oral TID Georgette Shell, MD   650 mg at 11/02/18 0818  . amLODipine (NORVASC) tablet 2.5 mg  2.5 mg Oral Daily Georgette Shell, MD   2.5 mg at 11/02/18 9030  . docusate sodium (COLACE) capsule 200 mg  200 mg Oral Daily Georgette Shell, MD   200 mg at 11/02/18 0923  . enoxaparin (LOVENOX) injection 30 mg  30 mg Subcutaneous Q24H Shelly Coss, MD   30 mg at 11/01/18 1535  . HYDROmorphone (DILAUDID) injection 0.5 mg  0.5 mg Intravenous Q30 min PRN Dorie Rank, MD   0.5 mg at 10/31/18 1241  . lactated ringers infusion   Intravenous Continuous Montez Hageman, MD 10 mL/hr at 10/31/18 1848    . meclizine (ANTIVERT) tablet 25 mg  25 mg Oral Daily Georgette Shell, MD   25 mg at 11/02/18 3007  . metoprolol tartrate (LOPRESSOR) injection 5 mg  5 mg Intravenous Q6H PRN Georgette Shell, MD      . traMADol Veatrice Bourbon) tablet 50 mg  50 mg Oral Q6H PRN Rodena Piety,  Noland Fordyce, MD   50 mg at 11/02/18 0530     Discharge Medications: Please see discharge summary for a list of discharge medications.  Relevant Imaging Results:  Relevant Lab Results:   Additional Information SS# [id number]  Wende Neighbors, LCSW

## 2018-11-02 NOTE — Progress Notes (Signed)
PROGRESS NOTE    [name]  SNK:[id number] DOB: [birth year] DOA: 10/31/2018 PCP: Lajean Manes, MD   Brief Narrative:  [name] a 85 y.o.femalewith medical history significant ofhtn, right hip fracture history of breast cancer admitted after a fall with complaints of left hip pain. Patient at baseline walks with a walker she tripped on the rug and fell landing on her left hip. She was unable to bear weight on the left leg.  X-ray done in the emergency department showed acute left intertrochanteric fracture.  Orthopedics consulted and she underwent ORIF.  PT/OT evaluation requested.   Assessment & Plan:   Active Problems:   Hip fracture (HCC)   Accelerated hypertension   Left intertrochanteric hip fracture: Status post ORIF.  Postop day 2.  PT/OT .  Continue DVT prophylaxis with Lovenox .Orthopedics recommending to discharge her on aspirin 81 mg twice daily.  Pain management.  Bowel regimen. She will follow-up with orthopedics as an outpatient in 2 weeks. She is from an independent living apartment.  Lives with her husband.  Needs rehab on discharge-COVID ordered today.  Social worker consulted.  History of hypertension: Continue Norvasc.  Current blood pressure stable  Macrocytosis: Vitamin B12 and folate level normal.  Leukocytosis :Most likely reactive.  No indication for antibiotic therapy.  DVT prophylaxis: Lovenox SQ  Code Status: DNR    Code Status Orders  (From admission, onward)         Start     Ordered   10/31/18 1616  Do not attempt resuscitation (DNR)  Continuous    Question Answer Comment  In the event of cardiac or respiratory ARREST Do not call a "code blue"   In the event of cardiac or respiratory ARREST Do not perform Intubation, CPR, defibrillation or ACLS   In the event of cardiac or respiratory ARREST Use medication by any route, position, wound care, and other measures to relive pain and suffering. May use oxygen, suction and  manual treatment of airway obstruction as needed for comfort.      10/31/18 1616        Code Status History    Date Active Date Inactive Code Status Order ID Comments User Context   07/10/2018 0005 07/15/2018 1628 DNR [id number]  Toy Baker, MD ED   Advance Care Planning Activity    Advance Directive Documentation     Most Recent Value  Type of Advance Directive  Healthcare Power of Attorney, Living will  Pre-existing out of facility DNR order (yellow form or pink MOST form)  -  "MOST" Form in Place?  -     Family Communication: None today, discussed in detail with patient Disposition Plan:   Patient remained inpatient for continued postoperative care, awaiting transfer to skilled nursing facility for intensive postoperative hip fracture pathway protocol Consults called: None Admission status: Inpatient   Consultants:   ORTHO  Procedures:  Dg Chest Port 1 View  Result Date: 10/31/2018 CLINICAL DATA:  Fall this morning. Left hip pain. Some shortness of breath. EXAM: PORTABLE CHEST 1 VIEW COMPARISON:  07/09/2018. FINDINGS: Cardiac silhouette normal in size. No mediastinal or hilar masses or evidence of adenopathy. Clear lungs.  No pleural effusion or pneumothorax. Skeletal structures are grossly intact. IMPRESSION: No active disease. Electronically Signed   By: Lajean Manes M.D.   On: 10/31/2018 13:11   Dg C-arm 1-60 Min-no Report  Result Date: 10/31/2018 Fluoroscopy was utilized by the requesting physician.  No radiographic interpretation.   Dg Hip Unilat With Pelvis  2-3 Views Left  Result Date: 10/31/2018 CLINICAL DATA:  Left hip pain after trip and fall this morning. Initial encounter. EXAM: DG HIP (WITH OR WITHOUT PELVIS) 2-3V LEFT COMPARISON:  Plain films right hip 07/09/2018. FINDINGS: The patient has an acute left intertrochanteric fracture. Hip screw and short intramedullary nail fix the right intertrochanteric fracture seen on the prior examination. The fracture  appears to be a partial union. The exam is otherwise negative. IMPRESSION: Acute left intertrochanteric fracture. Status post fixation of a right intertrochanteric fracture which appears to be a partial union. Electronically Signed   By: Inge Rise M.D.   On: 10/31/2018 13:10   Dg Femur 1v Left  Result Date: 10/31/2018 CLINICAL DATA:  IM nail left hip EXAM: LEFT FEMUR 1 VIEW COMPARISON:  10/31/2018 FINDINGS: Two low resolution intraoperative spot views of the left hip. Total fluoroscopy time was 1 minutes 10 seconds. There is intramedullary rod and screw fixation of the left femur for comminuted intertrochanteric fracture. IMPRESSION: Intraoperative fluoroscopic assistance provided during surgical fixation of proximal left femoral fracture Electronically Signed   By: Donavan Foil M.D.   On: 10/31/2018 20:51     Antimicrobials:   Perioperative per Ortho   Subjective: Patient reports she feels well Ready for rehab although does report she feels she will need intensive rehab  Objective: Vitals:   11/01/18 0607 11/01/18 2208 11/02/18 0455 11/02/18 0816  BP: (!) 136/59 (!) 141/67 (!) 142/45 131/70  Pulse: 81 77 76 76  Resp: 16 18 14    Temp: 98.2 F (36.8 C) 98.1 F (36.7 C) 97.9 F (36.6 C)   TempSrc: Oral Oral Oral   SpO2: 98% 97% 95%   Weight:      Height:        Intake/Output Summary (Last 24 hours) at 11/02/2018 1215 Last data filed at 11/02/2018 0815 Gross per 24 hour  Intake 940 ml  Output 500 ml  Net 440 ml   Filed Weights   10/31/18 1112 10/31/18 1113  Weight: 70.8 kg 71.4 kg    Examination:  General exam: Appears calm and comfortable  Respiratory system: Clear to auscultation. Respiratory effort normal. Cardiovascular system: S1 & S2 heard, RRR. No JVD, murmurs, rubs, gallops or clicks. No pedal edema. Gastrointestinal system: Abdomen is nondistended, soft and nontender. No organomegaly or masses felt. Normal bowel sounds heard. Central nervous system:  Alert and oriented. No focal neurological deficits. Extremities: Warm well perfused limited range of motion on affected surgical extremity otherwise no acute findings Skin: No rashes, lesions or ulcers Psychiatry: Judgement and insight appear normal. Mood & affect appropriate.     Data Reviewed: I have personally reviewed following labs and imaging studies  CBC: Recent Labs  Lab 10/31/18 1200 11/01/18 0332 11/02/18 0251  WBC 7.9 13.1* 8.3  NEUTROABS 5.0  --  5.1  HGB 13.0 10.1* 8.1*  HCT 40.8 31.8* 26.0*  MCV 101.0* 102.6* 102.8*  PLT 339 243 673   Basic Metabolic Panel: Recent Labs  Lab 10/31/18 1200 11/01/18 0332  NA 138 135  K 4.7 5.1  CL 103 105  CO2 24 21*  GLUCOSE 122* 159*  BUN 22 19  CREATININE 0.67 0.63  CALCIUM 8.9 8.4*  MG 2.2  --    GFR: Estimated Creatinine Clearance: 41.7 mL/min (by C-G formula based on SCr of 0.63 mg/dL). Liver Function Tests: Recent Labs  Lab 11/01/18 0332  AST 22  ALT 17  ALKPHOS 52  BILITOT 0.5  PROT 5.6*  ALBUMIN 3.2*  No results for input(s): LIPASE, AMYLASE in the last 168 hours. No results for input(s): AMMONIA in the last 168 hours. Coagulation Profile: Recent Labs  Lab 10/31/18 1200  INR 1.1   Cardiac Enzymes: No results for input(s): CKTOTAL, CKMB, CKMBINDEX, TROPONINI in the last 168 hours. BNP (last 3 results) No results for input(s): PROBNP in the last 8760 hours. HbA1C: No results for input(s): HGBA1C in the last 72 hours. CBG: No results for input(s): GLUCAP in the last 168 hours. Lipid Profile: No results for input(s): CHOL, HDL, LDLCALC, TRIG, CHOLHDL, LDLDIRECT in the last 72 hours. Thyroid Function Tests: No results for input(s): TSH, T4TOTAL, FREET4, T3FREE, THYROIDAB in the last 72 hours. Anemia Panel: Recent Labs    11/01/18 0332  VITAMINB12 359  FOLATE 16.6   Sepsis Labs: No results for input(s): PROCALCITON, LATICACIDVEN in the last 168 hours.  Recent Results (from the past 240  hour(s))  SARS Coronavirus 2 by RT PCR (hospital order, performed in [facility] hospital lab) Nasopharyngeal Nasopharyngeal Swab     Status: None   Collection Time: 10/31/18  3:41 PM   Specimen: Nasopharyngeal Swab  Result Value Ref Range Status   SARS Coronavirus 2 NEGATIVE NEGATIVE Final    Comment: (NOTE) If result is NEGATIVE SARS-CoV-2 target nucleic acids are NOT DETECTED. The SARS-CoV-2 RNA is generally detectable in upper and lower  respiratory specimens during the acute phase of infection. The lowest  concentration of SARS-CoV-2 viral copies this assay can detect is 250  copies / mL. A negative result does not preclude SARS-CoV-2 infection  and should not be used as the sole basis for treatment or other  patient management decisions.  A negative result may occur with  improper specimen collection / handling, submission of specimen other  than nasopharyngeal swab, presence of viral mutation(s) within the  areas targeted by this assay, and inadequate number of viral copies  (<250 copies / mL). A negative result must be combined with clinical  observations, patient history, and epidemiological information. If result is POSITIVE SARS-CoV-2 target nucleic acids are DETECTED. The SARS-CoV-2 RNA is generally detectable in upper and lower  respiratory specimens dur ing the acute phase of infection.  Positive  results are indicative of active infection with SARS-CoV-2.  Clinical  correlation with patient history and other diagnostic information is  necessary to determine patient infection status.  Positive results do  not rule out bacterial infection or co-infection with other viruses. If result is PRESUMPTIVE POSTIVE SARS-CoV-2 nucleic acids MAY BE PRESENT.   A presumptive positive result was obtained on the submitted specimen  and confirmed on repeat testing.  While 2019 novel coronavirus  (SARS-CoV-2) nucleic acids may be present in the submitted sample  additional confirmatory  testing may be necessary for epidemiological  and / or clinical management purposes  to differentiate between  SARS-CoV-2 and other Sarbecovirus currently known to infect humans.  If clinically indicated additional testing with an alternate test  methodology [phone]) is advised. The SARS-CoV-2 RNA is generally  detectable in upper and lower respiratory sp ecimens during the acute  phase of infection. The expected result is Negative. Fact Sheet for Patients:  StrictlyIdeas.no Fact Sheet for Healthcare Providers: BankingDealers.co.za This test is not yet approved or cleared by the [place] FDA and has been authorized for detection and/or diagnosis of SARS-CoV-2 by FDA under an Emergency Use Authorization (EUA).  This EUA will remain in effect (meaning this test can be used) for the duration of the  COVID-19 declaration under Section 564(b)(1) of the Act, 21 U.S.C. section 360bbb-3(b)(1), unless the authorization is terminated or revoked sooner. Performed at [facility], [place] [address] [place], [place] [address]   Surgical pcr screen     Status: None   Collection Time: 10/31/18  6:49 PM   Specimen: Nasal Mucosa; Nasal Swab  Result Value Ref Range Status   MRSA, PCR NEGATIVE NEGATIVE Final   Staphylococcus aureus NEGATIVE NEGATIVE Final    Comment: (NOTE) The Xpert SA Assay (FDA approved for NASAL specimens in patients 21 years of age and older), is one component of a comprehensive surveillance program. It is not intended to diagnose infection nor to guide or monitor treatment. Performed at [facility], [place] [address] [place], [place] [address]          Radiology Studies: Dg Chest Port 1 View  Result Date: 10/31/2018 CLINICAL DATA:  Fall this morning. Left hip pain. Some shortness of breath. EXAM: PORTABLE CHEST 1 VIEW COMPARISON:  07/09/2018. FINDINGS: Cardiac silhouette normal in  size. No mediastinal or hilar masses or evidence of adenopathy. Clear lungs.  No pleural effusion or pneumothorax. Skeletal structures are grossly intact. IMPRESSION: No active disease. Electronically Signed   By: Lajean Manes M.D.   On: 10/31/2018 13:11   Dg C-arm 1-60 Min-no Report  Result Date: 10/31/2018 Fluoroscopy was utilized by the requesting physician.  No radiographic interpretation.   Dg Hip Unilat With Pelvis 2-3 Views Left  Result Date: 10/31/2018 CLINICAL DATA:  Left hip pain after trip and fall this morning. Initial encounter. EXAM: DG HIP (WITH OR WITHOUT PELVIS) 2-3V LEFT COMPARISON:  Plain films right hip 07/09/2018. FINDINGS: The patient has an acute left intertrochanteric fracture. Hip screw and short intramedullary nail fix the right intertrochanteric fracture seen on the prior examination. The fracture appears to be a partial union. The exam is otherwise negative. IMPRESSION: Acute left intertrochanteric fracture. Status post fixation of a right intertrochanteric fracture which appears to be a partial union. Electronically Signed   By: Inge Rise M.D.   On: 10/31/2018 13:10   Dg Femur 1v Left  Result Date: 10/31/2018 CLINICAL DATA:  IM nail left hip EXAM: LEFT FEMUR 1 VIEW COMPARISON:  10/31/2018 FINDINGS: Two low resolution intraoperative spot views of the left hip. Total fluoroscopy time was 1 minutes 10 seconds. There is intramedullary rod and screw fixation of the left femur for comminuted intertrochanteric fracture. IMPRESSION: Intraoperative fluoroscopic assistance provided during surgical fixation of proximal left femoral fracture Electronically Signed   By: Donavan Foil M.D.   On: 10/31/2018 20:51        Scheduled Meds: . acetaminophen  650 mg Oral TID  . amLODipine  2.5 mg Oral Daily  . docusate sodium  200 mg Oral Daily  . enoxaparin (LOVENOX) injection  30 mg Subcutaneous Q24H  . meclizine  25 mg Oral Daily   Continuous Infusions: . lactated  ringers 10 mL/hr at 10/31/18 1848     LOS: 2 days    Time spent: [place], MD [facility]  If 7PM-7AM, please contact night-coverage  11/02/2018, 12:15 PM

## 2018-11-03 DIAGNOSIS — I10 Essential (primary) hypertension: Secondary | ICD-10-CM | POA: Diagnosis not present

## 2018-11-03 DIAGNOSIS — S72002A Fracture of unspecified part of neck of left femur, initial encounter for closed fracture: Secondary | ICD-10-CM | POA: Diagnosis not present

## 2018-11-03 MED ORDER — POLYETHYLENE GLYCOL 3350 17 G PO PACK
17.0000 g | PACK | Freq: Every day | ORAL | Status: DC | PRN
  Administered 2018-11-03 – 2018-11-05 (×3): 17 g via ORAL
  Filled 2018-11-03 (×2): qty 1

## 2018-11-03 NOTE — Care Management Important Message (Signed)
Important Message  Patient Details IM Letter given to Velva Harman RN to present to the Patient Name: Parrie Rasco MRN: [id number] Date of Birth: January 26, 1922   Medicare Important Message Given:  Yes     Kerin Salen 11/03/2018, 10:23 AM

## 2018-11-03 NOTE — TOC Progression Note (Signed)
Transition of Care [facility]) - Progression Note    Patient Details  Name: Laelah Siravo MRN: [id number] Date of Birth: 1922-04-30  [facility] CM/SW Contact  Leeroy Cha, RN Phone Number: 11/03/2018, 11:51 AM  Clinical Narrative:    TCT-[facility] per caller haVE TO VERIFY BENEFITS and wcb.   Expected Discharge Plan: Skilled Nursing Facility Barriers to Discharge: Ship broker, Continued Medical Work up  Expected Discharge Plan and Services Expected Discharge Plan: [place] In-house Referral: Clinical Social Work   Post Acute Care Choice: [place] Living arrangements for the past 2 months: [place]                                       Social Determinants of Health (SDOH) Interventions    Readmission Risk Interventions No flowsheet data found.

## 2018-11-03 NOTE — Progress Notes (Signed)
PROGRESS NOTE    [name]  QIH:[id number] DOB: Aug 17, 1922 DOA: 10/31/2018 PCP: Lajean Manes, MD   Brief Narrative:  [name] a 85 y.o.femalewith medical history significant ofhtn, right hip fracture history of breast cancer admitted after a fall with complaints of left hip pain. Patient at baseline walks with a walker she tripped on the rug and fell landing on her left hip. She was unable to bear weight on the left leg.X-ray done in the emergency department showed acute left intertrochanteric fracture.Orthopedics consulted and she underwent ORIF. PT/OT evaluation requested.   Assessment & Plan:   Active Problems:   Hip fracture (HCC)   Accelerated hypertension   Left intertrochanteric hip fracture: Status post ORIF. Postop day 2. PT/OT . Continue DVT prophylaxis with Lovenox .Orthopedics recommending to discharge her on aspirin 81 mg twice daily. Pain management. Bowel regimen. She will follow-up with orthopedics as an outpatient in 2 weeks. She is from [place] [facility] apartment. Lives with her husband. Needs rehab on discharge-COVID ordered today. Social worker consulted.  History of hypertension: Continue Norvasc. Current blood pressure stable  Macrocytosis:Vitamin B12 and folate level normal.  Leukocytosis:Most likely reactive. No indication for antibiotic therapy.  DVT prophylaxis: Lovenox SQ  Code Status: DNR    Code Status Orders  (From admission, onward)         Start     Ordered   10/31/18 1616  Do not attempt resuscitation (DNR)  Continuous    Question Answer Comment  In the event of cardiac or respiratory ARREST Do not call a code blue   In the event of cardiac or respiratory ARREST Do not perform Intubation, CPR, defibrillation or ACLS   In the event of cardiac or respiratory ARREST Use medication by any route, position, wound care, and other measures to relive pain and suffering. May use oxygen, suction and  manual treatment of airway obstruction as needed for comfort.      10/31/18 1616        Code Status History    Date Active Date Inactive Code Status Order ID Comments User Context   07/10/2018 0005 07/15/2018 1628 DNR [id number]  Toy Baker, MD ED   Advance Care Planning Activity    Advance Directive Documentation     Most Recent Value  Type of Advance Directive  Healthcare Power of Attorney, Living will  Pre-existing out of facility DNR order (yellow form or pink MOST form)  --  "MOST" Form in Place?  --     Family Communication: [place]  [phone], ANSWERED ALL QUESTIONS Disposition Plan:   Patient will remain inpatient additional day awaiting authorization for transfer to skilled nursing facility for intensive physical therapy postoperative hip pathway protocol.  There is no other safe discharge plan Consults called: None Admission status: Inpatient   Consultants:   None  Procedures:  Dg Chest Port 1 View  Result Date: 10/31/2018 CLINICAL DATA:  Fall this morning. Left hip pain. Some shortness of breath. EXAM: PORTABLE CHEST 1 VIEW COMPARISON:  07/09/2018. FINDINGS: Cardiac silhouette normal in size. No mediastinal or hilar masses or evidence of adenopathy. Clear lungs.  No pleural effusion or pneumothorax. Skeletal structures are grossly intact. IMPRESSION: No active disease. Electronically Signed   By: Lajean Manes M.D.   On: 10/31/2018 13:11   Dg C-arm 1-60 Min-no Report  Result Date: 10/31/2018 Fluoroscopy was utilized by the requesting physician.  No radiographic interpretation.   Dg Hip Unilat With Pelvis 2-3 Views Left  Result Date:  10/31/2018 CLINICAL DATA:  Left hip pain after trip and fall this morning. Initial encounter. EXAM: DG HIP (WITH OR WITHOUT PELVIS) 2-3V LEFT COMPARISON:  Plain films right hip 07/09/2018. FINDINGS: The patient has an acute left intertrochanteric fracture. Hip screw and short intramedullary nail fix the right  intertrochanteric fracture seen on the prior examination. The fracture appears to be a partial union. The exam is otherwise negative. IMPRESSION: Acute left intertrochanteric fracture. Status post fixation of a right intertrochanteric fracture which appears to be a partial union. Electronically Signed   By: Inge Rise M.D.   On: 10/31/2018 13:10   Dg Femur 1v Left  Result Date: 10/31/2018 CLINICAL DATA:  IM nail left hip EXAM: LEFT FEMUR 1 VIEW COMPARISON:  10/31/2018 FINDINGS: Two low resolution intraoperative spot views of the left hip. Total fluoroscopy time was 1 minutes 10 seconds. There is intramedullary rod and screw fixation of the left femur for comminuted intertrochanteric fracture. IMPRESSION: Intraoperative fluoroscopic assistance provided during surgical fixation of proximal left femoral fracture Electronically Signed   By: Donavan Foil M.D.   On: 10/31/2018 20:51     Antimicrobials:   None   Subjective: No acute events overnight resting comfortably in bed Eager to start rehab  Objective: Vitals:   11/02/18 1443 11/02/18 2123 11/03/18 0453 11/03/18 1351  BP: (!) 141/60 (!) 145/48 (!) 154/52 (!) 149/64  Pulse: 80 79 78 88  Resp: 16 16 16 18   Temp: 98.2 F (36.8 C) 98.3 F (36.8 C) 98.2 F (36.8 C) 98.2 F (36.8 C)  TempSrc:      SpO2: 97% 94% 98% 96%  Weight:      Height:        Intake/Output Summary (Last 24 hours) at 11/03/2018 1558 Last data filed at 11/03/2018 1446 Gross per 24 hour  Intake 840 ml  Output 2950 ml  Net -2110 ml   Filed Weights   10/31/18 1112 10/31/18 1113  Weight: 70.8 kg 71.4 kg    Examination:  General exam: Appears calm and comfortable  Respiratory system: Clear to auscultation. Respiratory effort normal. Cardiovascular system: S1 & S2 heard, RRR. No JVD, murmurs, rubs, gallops or clicks. No pedal edema. Gastrointestinal system: Abdomen is nondistended, soft and nontender. No organomegaly or masses felt. Normal bowel  sounds heard. Central nervous system: Alert and oriented. No focal neurological deficits. Extremities: Warm well perfused, neurovascularly intact. Skin: No rashes, lesions or ulcers Psychiatry: Judgement and insight appear normal. Mood & affect appropriate.     Data Reviewed: I have personally reviewed following labs and imaging studies  CBC: Recent Labs  Lab 10/31/18 1200 11/01/18 0332 11/02/18 0251  WBC 7.9 13.1* 8.3  NEUTROABS 5.0  --  5.1  HGB 13.0 10.1* 8.1*  HCT 40.8 31.8* 26.0*  MCV 101.0* 102.6* 102.8*  PLT 339 243 846   Basic Metabolic Panel: Recent Labs  Lab 10/31/18 1200 11/01/18 0332  NA 138 135  K 4.7 5.1  CL 103 105  CO2 24 21*  GLUCOSE 122* 159*  BUN 22 19  CREATININE 0.67 0.63  CALCIUM 8.9 8.4*  MG 2.2  --    GFR: Estimated Creatinine Clearance: 41.7 mL/min (by C-G formula based on SCr of 0.63 mg/dL). Liver Function Tests: Recent Labs  Lab 11/01/18 0332  AST 22  ALT 17  ALKPHOS 52  BILITOT 0.5  PROT 5.6*  ALBUMIN 3.2*   No results for input(s): LIPASE, AMYLASE in the last 168 hours. No results for input(s): AMMONIA in the  last 168 hours. Coagulation Profile: Recent Labs  Lab 10/31/18 1200  INR 1.1   Cardiac Enzymes: No results for input(s): CKTOTAL, CKMB, CKMBINDEX, TROPONINI in the last 168 hours. BNP (last 3 results) No results for input(s): PROBNP in the last 8760 hours. HbA1C: No results for input(s): HGBA1C in the last 72 hours. CBG: No results for input(s): GLUCAP in the last 168 hours. Lipid Profile: No results for input(s): CHOL, HDL, LDLCALC, TRIG, CHOLHDL, LDLDIRECT in the last 72 hours. Thyroid Function Tests: No results for input(s): TSH, T4TOTAL, FREET4, T3FREE, THYROIDAB in the last 72 hours. Anemia Panel: Recent Labs    11/01/18 0332  VITAMINB12 359  FOLATE 16.6   Sepsis Labs: No results for input(s): PROCALCITON, LATICACIDVEN in the last 168 hours.  Recent Results (from the past 240 hour(s))  SARS  Coronavirus 2 by RT PCR (hospital order, performed in [facility] hospital lab) Nasopharyngeal Nasopharyngeal Swab     Status: None   Collection Time: 10/31/18  3:41 PM   Specimen: Nasopharyngeal Swab  Result Value Ref Range Status   SARS Coronavirus 2 NEGATIVE NEGATIVE Final    Comment: (NOTE) If result is NEGATIVE SARS-CoV-2 target nucleic acids are NOT DETECTED. The SARS-CoV-2 RNA is generally detectable in upper and lower  respiratory specimens during the acute phase of infection. The lowest  concentration of SARS-CoV-2 viral copies this assay can detect is 250  copies / mL. A negative result does not preclude SARS-CoV-2 infection  and should not be used as the sole basis for treatment or other  patient management decisions.  A negative result may occur with  improper specimen collection / handling, submission of specimen other  than nasopharyngeal swab, presence of viral mutation(s) within the  areas targeted by this assay, and inadequate number of viral copies  (<250 copies / mL). A negative result must be combined with clinical  observations, patient history, and epidemiological information. If result is POSITIVE SARS-CoV-2 target nucleic acids are DETECTED. The SARS-CoV-2 RNA is generally detectable in upper and lower  respiratory specimens dur ing the acute phase of infection.  Positive  results are indicative of active infection with SARS-CoV-2.  Clinical  correlation with patient history and other diagnostic information is  necessary to determine patient infection status.  Positive results do  not rule out bacterial infection or co-infection with other viruses. If result is PRESUMPTIVE POSTIVE SARS-CoV-2 nucleic acids MAY BE PRESENT.   A presumptive positive result was obtained on the submitted specimen  and confirmed on repeat testing.  While 2019 novel coronavirus  (SARS-CoV-2) nucleic acids may be present in the submitted sample  additional confirmatory testing may be  necessary for epidemiological  and / or clinical management purposes  to differentiate between  SARS-CoV-2 and other Sarbecovirus currently known to infect humans.  If clinically indicated additional testing with an alternate test  methodology [phone]) is advised. The SARS-CoV-2 RNA is generally  detectable in upper and lower respiratory sp ecimens during the acute  phase of infection. The expected result is Negative. Fact Sheet for Patients:  StrictlyIdeas.no Fact Sheet for Healthcare Providers: BankingDealers.co.za This test is not yet approved or cleared by the [place] FDA and has been authorized for detection and/or diagnosis of SARS-CoV-2 by FDA under an Emergency Use Authorization (EUA).  This EUA will remain in effect (meaning this test can be used) for the duration of the COVID-19 declaration under Section 564(b)(1) of the Act, 21 U.S.C. section 360bbb-3(b)(1), unless the authorization is terminated or  revoked sooner. Performed at [facility], [place] [address], [place], [place] [address]   Surgical pcr screen     Status: None   Collection Time: 10/31/18  6:49 PM   Specimen: Nasal Mucosa; Nasal Swab  Result Value Ref Range Status   MRSA, PCR NEGATIVE NEGATIVE Final   Staphylococcus aureus NEGATIVE NEGATIVE Final    Comment: (NOTE) The Xpert SA Assay (FDA approved for NASAL specimens in patients 74 years of age and older), is one component of a comprehensive surveillance program. It is not intended to diagnose infection nor to guide or monitor treatment. Performed at [facility], [place] [address], [place], [place] [address]   SARS CORONAVIRUS 2 (TAT 6-24 HRS) Nasopharyngeal Nasopharyngeal Swab     Status: None   Collection Time: 11/02/18 11:24 AM   Specimen: Nasopharyngeal Swab  Result Value Ref Range Status   SARS Coronavirus 2 NEGATIVE NEGATIVE Final    Comment: (NOTE) SARS-CoV-2  target nucleic acids are NOT DETECTED. The SARS-CoV-2 RNA is generally detectable in upper and lower respiratory specimens during the acute phase of infection. Negative results do not preclude SARS-CoV-2 infection, do not rule out co-infections with other pathogens, and should not be used as the sole basis for treatment or other patient management decisions. Negative results must be combined with clinical observations, patient history, and epidemiological information. The expected result is Negative. Fact Sheet for Patients: SugarRoll.be Fact Sheet for Healthcare Providers: https://www.woods-mathews.com/ This test is not yet approved or cleared by the [place] FDA and  has been authorized for detection and/or diagnosis of SARS-CoV-2 by FDA under an Emergency Use Authorization (EUA). This EUA will remain  in effect (meaning this test can be used) for the duration of the COVID-19 declaration under Section 56 4(b)(1) of the Act, 21 U.S.C. section 360bbb-3(b)(1), unless the authorization is terminated or revoked sooner. Performed at [facility], [place] [address] [place], [place] [address]          Radiology Studies: No results found.      Scheduled Meds:  acetaminophen  650 mg Oral TID   amLODipine  2.5 mg Oral Daily   docusate sodium  200 mg Oral Daily   enoxaparin (LOVENOX) injection  30 mg Subcutaneous Q24H   meclizine  25 mg Oral Daily   Continuous Infusions:  lactated ringers 10 mL/hr at 10/31/18 1848     LOS: 3 days    Time spent: Craig Beach, MD [facility]  If 7PM-7AM, please contact night-coverage  11/03/2018, 3:58 PM

## 2018-11-04 ENCOUNTER — Encounter (HOSPITAL_COMMUNITY): Payer: Self-pay | Admitting: Orthopedic Surgery

## 2018-11-04 DIAGNOSIS — I10 Essential (primary) hypertension: Secondary | ICD-10-CM | POA: Diagnosis not present

## 2018-11-04 DIAGNOSIS — S72002A Fracture of unspecified part of neck of left femur, initial encounter for closed fracture: Secondary | ICD-10-CM | POA: Diagnosis not present

## 2018-11-04 MED ORDER — FLEET ENEMA 7-19 GM/118ML RE ENEM
1.0000 | ENEMA | Freq: Once | RECTAL | Status: AC
  Administered 2018-11-04: 10:00:00 1 via RECTAL
  Filled 2018-11-04: qty 1

## 2018-11-04 MED ORDER — METHOCARBAMOL 500 MG PO TABS
250.0000 mg | ORAL_TABLET | Freq: Once | ORAL | Status: AC
  Administered 2018-11-04: 05:00:00 250 mg via ORAL
  Filled 2018-11-04: qty 1

## 2018-11-04 MED ORDER — METHOCARBAMOL 500 MG PO TABS
500.0000 mg | ORAL_TABLET | Freq: Three times a day (TID) | ORAL | Status: DC | PRN
  Administered 2018-11-04 – 2018-11-06 (×4): 500 mg via ORAL
  Filled 2018-11-04 (×4): qty 1

## 2018-11-04 NOTE — Progress Notes (Signed)
PROGRESS NOTE    [name]  WJX:[id number] DOB: 04-21-1922 DOA: 10/31/2018 PCP: Lajean Manes, MD   Brief Narrative:  [name] a 85 y.o.femalewith medical history significant ofhtn, right hip fracture history of breast cancer admitted after a fall with complaints of left hip pain. Patient at baseline walks with a walker she tripped on the rug and fell landing on her left hip. She was unable to bear weight on the left leg.X-ray done in the emergency department showed acute left intertrochanteric fracture.Orthopedics consulted and she underwent ORIF. PT/OT evaluation requested.   Assessment & Plan:   Active Problems:   Hip fracture (HCC)   Accelerated hypertension   Left intertrochanteric hip fracture: Status post ORIF. Postop day4. PT/OT recs SNF. Continue DVT prophylaxis with Lovenox .Orthopedics recommending to discharge her on aspirin 81 mg twice daily. Pain management. Bowel regimen. She will follow-up with orthopedics as an outpatient in 2 weeks. She is from [facility] living apartment. Lives with her husband. Needs rehab on discharge-COVID NEG. Social worker consulted-PENDING AUTHORIZATION  History of hypertension: Continue Norvasc. Current blood pressure stable  Macrocytosis:Vitamin B12 and folate level normal.  Leukocytosis:Most likely reactive. No indication for antibiotic therapy.   DVT prophylaxis: Lovenox SQ  Code Status: DNR    Code Status Orders  (From admission, onward)         Start     Ordered   10/31/18 1616  Do not attempt resuscitation (DNR)  Continuous    Question Answer Comment  In the event of cardiac or respiratory ARREST Do not call a code blue   In the event of cardiac or respiratory ARREST Do not perform Intubation, CPR, defibrillation or ACLS   In the event of cardiac or respiratory ARREST Use medication by any route, position, wound care, and other measures to relive pain and suffering. May use  oxygen, suction and manual treatment of airway obstruction as needed for comfort.      10/31/18 1616        Code Status History    Date Active Date Inactive Code Status Order ID Comments User Context   07/10/2018 0005 07/15/2018 1628 DNR [id number]  Toy Baker, MD ED   Advance Care Planning Activity    Advance Directive Documentation     Most Recent Value  Type of Advance Directive  Healthcare Power of Attorney, Living will  Pre-existing out of facility DNR order (yellow form or pink MOST form)  --  "MOST" Form in Place?  --     Family Communication: None today Disposition Plan:   Patient remain inpatient additional day awaiting authorization for transfer to skilled nursing facility. Consults called: None Admission status: Inpatient   Consultants:   ORTHO  Procedures:  Dg Chest Port 1 View  Result Date: 10/31/2018 CLINICAL DATA:  Fall this morning. Left hip pain. Some shortness of breath. EXAM: PORTABLE CHEST 1 VIEW COMPARISON:  07/09/2018. FINDINGS: Cardiac silhouette normal in size. No mediastinal or hilar masses or evidence of adenopathy. Clear lungs.  No pleural effusion or pneumothorax. Skeletal structures are grossly intact. IMPRESSION: No active disease. Electronically Signed   By: Lajean Manes M.D.   On: 10/31/2018 13:11   Dg C-arm 1-60 Min-no Report  Result Date: 10/31/2018 Fluoroscopy was utilized by the requesting physician.  No radiographic interpretation.   Dg Hip Unilat With Pelvis 2-3 Views Left  Result Date: 10/31/2018 CLINICAL DATA:  Left hip pain after trip and fall this morning. Initial encounter. EXAM: DG HIP (WITH OR WITHOUT PELVIS) 2-3V  LEFT COMPARISON:  Plain films right hip 07/09/2018. FINDINGS: The patient has an acute left intertrochanteric fracture. Hip screw and short intramedullary nail fix the right intertrochanteric fracture seen on the prior examination. The fracture appears to be a partial union. The exam is otherwise negative.  IMPRESSION: Acute left intertrochanteric fracture. Status post fixation of a right intertrochanteric fracture which appears to be a partial union. Electronically Signed   By: Inge Rise M.D.   On: 10/31/2018 13:10   Dg Femur 1v Left  Result Date: 10/31/2018 CLINICAL DATA:  IM nail left hip EXAM: LEFT FEMUR 1 VIEW COMPARISON:  10/31/2018 FINDINGS: Two low resolution intraoperative spot views of the left hip. Total fluoroscopy time was 1 minutes 10 seconds. There is intramedullary rod and screw fixation of the left femur for comminuted intertrochanteric fracture. IMPRESSION: Intraoperative fluoroscopic assistance provided during surgical fixation of proximal left femoral fracture Electronically Signed   By: Donavan Foil M.D.   On: 10/31/2018 20:51     Antimicrobials:   NONE    Subjective: No acute events overnight,  Objective: Vitals:   11/03/18 1722 11/03/18 2050 11/04/18 0622 11/04/18 0956  BP: (!) 128/92 (!) 150/76 (!) 149/62 (!) 158/58  Pulse: 85 95 80 87  Resp: 18 16 17    Temp: 98.4 F (36.9 C) 99 F (37.2 C) 97.9 F (36.6 C)   TempSrc: Oral Oral    SpO2: 96% 95% 96% 100%  Weight:      Height:        Intake/Output Summary (Last 24 hours) at 11/04/2018 1312 Last data filed at 11/04/2018 1039 Gross per 24 hour  Intake 600 ml  Output 1650 ml  Net -1050 ml   Filed Weights   10/31/18 1112 10/31/18 1113  Weight: 70.8 kg 71.4 kg    Examination:  General exam: Appears calm and comfortable  Respiratory system: Clear to auscultation. Respiratory effort normal. Cardiovascular system: S1 & S2 heard, RRR. No JVD, murmurs, rubs, gallops or clicks. No pedal edema. Gastrointestinal system: Abdomen is nondistended, soft and nontender. No organomegaly or masses felt. Normal bowel sounds heard. Central nervous system: Alert and oriented. No focal neurological deficits. Extremities: Warm well perfused, limited range of motion secondary to pain on surgical extremity,  neurovascular intact Skin: No rashes, lesions or ulcers Psychiatry: Judgement and insight appear normal. Mood & affect appropriate.     Data Reviewed: I have personally reviewed following labs and imaging studies  CBC: Recent Labs  Lab 10/31/18 1200 11/01/18 0332 11/02/18 0251  WBC 7.9 13.1* 8.3  NEUTROABS 5.0  --  5.1  HGB 13.0 10.1* 8.1*  HCT 40.8 31.8* 26.0*  MCV 101.0* 102.6* 102.8*  PLT 339 243 778   Basic Metabolic Panel: Recent Labs  Lab 10/31/18 1200 11/01/18 0332  NA 138 135  K 4.7 5.1  CL 103 105  CO2 24 21*  GLUCOSE 122* 159*  BUN 22 19  CREATININE 0.67 0.63  CALCIUM 8.9 8.4*  MG 2.2  --    GFR: Estimated Creatinine Clearance: 41.7 mL/min (by C-G formula based on SCr of 0.63 mg/dL). Liver Function Tests: Recent Labs  Lab 11/01/18 0332  AST 22  ALT 17  ALKPHOS 52  BILITOT 0.5  PROT 5.6*  ALBUMIN 3.2*   No results for input(s): LIPASE, AMYLASE in the last 168 hours. No results for input(s): AMMONIA in the last 168 hours. Coagulation Profile: Recent Labs  Lab 10/31/18 1200  INR 1.1   Cardiac Enzymes: No results for input(s): CKTOTAL,  CKMB, CKMBINDEX, TROPONINI in the last 168 hours. BNP (last 3 results) No results for input(s): PROBNP in the last 8760 hours. HbA1C: No results for input(s): HGBA1C in the last 72 hours. CBG: No results for input(s): GLUCAP in the last 168 hours. Lipid Profile: No results for input(s): CHOL, HDL, LDLCALC, TRIG, CHOLHDL, LDLDIRECT in the last 72 hours. Thyroid Function Tests: No results for input(s): TSH, T4TOTAL, FREET4, T3FREE, THYROIDAB in the last 72 hours. Anemia Panel: No results for input(s): VITAMINB12, FOLATE, FERRITIN, TIBC, IRON, RETICCTPCT in the last 72 hours. Sepsis Labs: No results for input(s): PROCALCITON, LATICACIDVEN in the last 168 hours.  Recent Results (from the past 240 hour(s))  SARS Coronavirus 2 by RT PCR (hospital order, performed in [facility] hospital lab) Nasopharyngeal  Nasopharyngeal Swab     Status: None   Collection Time: 10/31/18  3:41 PM   Specimen: Nasopharyngeal Swab  Result Value Ref Range Status   SARS Coronavirus 2 NEGATIVE NEGATIVE Final    Comment: (NOTE) If result is NEGATIVE SARS-CoV-2 target nucleic acids are NOT DETECTED. The SARS-CoV-2 RNA is generally detectable in upper and lower  respiratory specimens during the acute phase of infection. The lowest  concentration of SARS-CoV-2 viral copies this assay can detect is 250  copies / mL. A negative result does not preclude SARS-CoV-2 infection  and should not be used as the sole basis for treatment or other  patient management decisions.  A negative result may occur with  improper specimen collection / handling, submission of specimen other  than nasopharyngeal swab, presence of viral mutation(s) within the  areas targeted by this assay, and inadequate number of viral copies  (<250 copies / mL). A negative result must be combined with clinical  observations, patient history, and epidemiological information. If result is POSITIVE SARS-CoV-2 target nucleic acids are DETECTED. The SARS-CoV-2 RNA is generally detectable in upper and lower  respiratory specimens dur ing the acute phase of infection.  Positive  results are indicative of active infection with SARS-CoV-2.  Clinical  correlation with patient history and other diagnostic information is  necessary to determine patient infection status.  Positive results do  not rule out bacterial infection or co-infection with other viruses. If result is PRESUMPTIVE POSTIVE SARS-CoV-2 nucleic acids MAY BE PRESENT.   A presumptive positive result was obtained on the submitted specimen  and confirmed on repeat testing.  While 2019 novel coronavirus  (SARS-CoV-2) nucleic acids may be present in the submitted sample  additional confirmatory testing may be necessary for epidemiological  and / or clinical management purposes  to differentiate between   SARS-CoV-2 and other Sarbecovirus currently known to infect humans.  If clinically indicated additional testing with an alternate test  methodology [phone]) is advised. The SARS-CoV-2 RNA is generally  detectable in upper and lower respiratory sp ecimens during the acute  phase of infection. The expected result is Negative. Fact Sheet for Patients:  StrictlyIdeas.no Fact Sheet for Healthcare Providers: BankingDealers.co.za This test is not yet approved or cleared by the [place] FDA and has been authorized for detection and/or diagnosis of SARS-CoV-2 by FDA under an Emergency Use Authorization (EUA).  This EUA will remain in effect (meaning this test can be used) for the duration of the COVID-19 declaration under Section 564(b)(1) of the Act, 21 U.S.C. section 360bbb-3(b)(1), unless the authorization is terminated or revoked sooner. Performed at [facility], [place] [address] [place], [place] [address]   Surgical pcr screen  Status: None   Collection Time: 10/31/18  6:49 PM   Specimen: Nasal Mucosa; Nasal Swab  Result Value Ref Range Status   MRSA, PCR NEGATIVE NEGATIVE Final   Staphylococcus aureus NEGATIVE NEGATIVE Final    Comment: (NOTE) The Xpert SA Assay (FDA approved for NASAL specimens in patients 1 years of age and older), is one component of a comprehensive surveillance program. It is not intended to diagnose infection nor to guide or monitor treatment. Performed at [facility], [place] [address] [place], [place] [address]   SARS CORONAVIRUS 2 (TAT 6-24 HRS) Nasopharyngeal Nasopharyngeal Swab     Status: None   Collection Time: 11/02/18 11:24 AM   Specimen: Nasopharyngeal Swab  Result Value Ref Range Status   SARS Coronavirus 2 NEGATIVE NEGATIVE Final    Comment: (NOTE) SARS-CoV-2 target nucleic acids are NOT DETECTED. The SARS-CoV-2 RNA is generally detectable in upper and  lower respiratory specimens during the acute phase of infection. Negative results do not preclude SARS-CoV-2 infection, do not rule out co-infections with other pathogens, and should not be used as the sole basis for treatment or other patient management decisions. Negative results must be combined with clinical observations, patient history, and epidemiological information. The expected result is Negative. Fact Sheet for Patients: SugarRoll.be Fact Sheet for Healthcare Providers: https://www.woods-mathews.com/ This test is not yet approved or cleared by the [place] FDA and  has been authorized for detection and/or diagnosis of SARS-CoV-2 by FDA under an Emergency Use Authorization (EUA). This EUA will remain  in effect (meaning this test can be used) for the duration of the COVID-19 declaration under Section 56 4(b)(1) of the Act, 21 U.S.C. section 360bbb-3(b)(1), unless the authorization is terminated or revoked sooner. Performed at [facility], [place] [address] [place], [place] [address]          Radiology Studies: No results found.      Scheduled Meds:  acetaminophen  650 mg Oral TID   amLODipine  2.5 mg Oral Daily   docusate sodium  200 mg Oral Daily   enoxaparin (LOVENOX) injection  30 mg Subcutaneous Q24H   meclizine  25 mg Oral Daily   Continuous Infusions:  lactated ringers 10 mL/hr at 10/31/18 1848     LOS: 4 days    Time spent: [place], MD [facility]  If 7PM-7AM, please contact night-coverage  11/04/2018, 1:12 PM

## 2018-11-04 NOTE — Progress Notes (Signed)
Physical Therapy Treatment Patient Details Name: [name] MRN: [id number] DOB: Jun 09, 1922 Today's Date: 11/04/2018    History of Present Illness Pt s/p fall with L hip fx and now s/p IM nailing.  Pt with hx of breast CA, "birth injury" limiting use of R UE and R hip fx s/p IM nailing 7/20    PT Comments    POD # 4 Assisted OOB to [facility].  General bed mobility comments: cues for sequence and physical assist to manage LEs and to control trunk as well as used bed pad to complete scooting to EOB.  General transfer comment: assisted from elevated bed to [facility] with 50% VC's on proper hand transfer and increased time to weight shift and complete 1/4 turn.General Gait Details: great difficulty weight shifting and support self upright requiring + 2 side by side assist with recliner closely to follow.  Limited distance by effort and fatigue. Pt will need ST Rehab at SNF    Follow Up Recommendations  SNF     Equipment Recommendations  None recommended by PT    Recommendations for Other Services       Precautions / Restrictions Precautions Precautions: Fall Restrictions Weight Bearing Restrictions: No Other Position/Activity Restrictions: WBAT    Mobility  Bed Mobility Overal bed mobility: Needs Assistance Bed Mobility: Supine to Sit     Supine to sit: +2 for physical assistance;+2 for safety/equipment;Mod assist;Max assist     General bed mobility comments: cues for sequence and physical assist to manage LEs and to control trunk as well as used bed pad to complete scooting to EOB  Transfers Overall transfer level: Needs assistance Equipment used: Rolling walker (2 wheeled);None Transfers: Sit to/from [facility] Sit to Stand: Mod assist;+2 physical assistance;+2 safety/equipment Stand pivot transfers: Max assist;+2 safety/equipment;+2 physical assistance       General transfer comment: assisted from elevated bed to [facility] with 50% VC's on proper hand transfer  and increased time to weight shift and complete 1/4 turn  Ambulation/Gait Ambulation/Gait assistance: Max assist;+2 physical assistance;+2 safety/equipment Gait Distance (Feet): 1 Feet Assistive device: Rolling walker (2 wheeled) Gait Pattern/deviations: Step-to pattern;Decreased step length - right;Decreased step length - left;Shuffle;Trunk flexed Gait velocity: decreased   General Gait Details: great difficulty weight shifting and support self upright requiring + 2 side by side assist with recliner closely to follow.  Limited distance by effort and fatigue.   Stairs             Wheelchair Mobility    Modified Rankin (Stroke Patients Only)       Balance                                            Cognition   Behavior During Therapy: WFL for tasks assessed/performed Overall Cognitive Status: Within Functional Limits for tasks assessed                                        Exercises      General Comments        Pertinent Vitals/Pain Pain Assessment: Faces Faces Pain Scale: Hurts little more Pain Location: L hip with movement and WB Pain Descriptors / Indicators: Aching;Sore;Grimacing Pain Intervention(s): Monitored during session;Repositioned;Ice applied    Home Living  Prior Function            PT Goals (current goals can now be found in the care plan section) Progress towards PT goals: Progressing toward goals    Frequency    Min 3X/week      PT Plan Current plan remains appropriate    Co-evaluation              AM-PAC PT "6 Clicks" Mobility   Outcome Measure  Help needed turning from your back to your side while in a flat bed without using bedrails?: A Lot Help needed moving from lying on your back to sitting on the side of a flat bed without using bedrails?: A Lot Help needed moving to and from a bed to a chair (including a wheelchair)?: A Lot Help needed standing up  from a chair using your arms (e.g., wheelchair or bedside chair)?: A Lot Help needed to walk in hospital room?: A Lot Help needed climbing 3-5 steps with a railing? : Total 6 Click Score: 11    End of Session Equipment Utilized During Treatment: Gait belt Activity Tolerance: Patient limited by fatigue;Patient limited by pain Patient left: in chair;with call bell/phone within reach Nurse Communication: Mobility status PT Visit Diagnosis: Difficulty in walking, not elsewhere classified (R26.2);Muscle weakness (generalized) (M62.81)     Time: 6979-4801 PT Time Calculation (min) (ACUTE ONLY): 26 min  Charges:  $Gait Training: 8-22 mins $Therapeutic Activity: 8-22 mins                     Rica Koyanagi  PTA Acute  Rehabilitation Services Pager      [phone] Office      [phone]

## 2018-11-04 NOTE — Plan of Care (Signed)

## 2018-11-04 NOTE — Progress Notes (Signed)
Occupational Therapy Treatment Patient Details Name: [name] MRN: [id number] DOB: 02/08/1922 Today's Date: 11/04/2018    History of present illness Pt s/p fall with L hip fx and now s/p IM nailing.  Pt with hx of breast CA, "birth injury" limiting use of R UE and R hip fx s/p IM nailing 7/20   OT comments  Pt with good participation.  Pt in chair and wanted to stand before dinner.  Pt stood once with OT and fatigued quickly.  Pt had a hard time repositioning feet and keeping feet apart.  Pt aware and willing but needed significant A  Follow Up Recommendations  SNF;Supervision/Assistance - 24 hour;Other (comment)(higher level of care at current facility)    Equipment Recommendations  None recommended by OT    Recommendations for Other Services      Precautions / Restrictions Precautions Precautions: Fall Restrictions Weight Bearing Restrictions: No Other Position/Activity Restrictions: WBAT       Mobility Bed Mobility               General bed mobility comments: pt in chair  Transfers Overall transfer level: Needs assistance   Transfers: Sit to/from Stand Sit to Stand: Max assist;From elevated surface              Balance Overall balance assessment: Needs assistance Sitting-balance support: Bilateral upper extremity supported;Feet supported Sitting balance-Leahy Scale: Fair     Standing balance support: Bilateral upper extremity supported Standing balance-Leahy Scale: Poor                             ADL either performed or assessed with clinical judgement   ADL Overall ADL's : Needs assistance/impaired                     Lower Body Dressing: Maximal assistance;Sitting/lateral leans;Sit to/from stand;Cueing for safety;Cueing for sequencing;Cueing for compensatory techniques Lower Body Dressing Details (indicate cue type and reason): sit to stand from chair     Toileting- Clothing Manipulation and Hygiene: Total  assistance;Sit to/from stand;Cueing for compensatory techniques;Cueing for safety;Cueing for sequencing Toileting - Clothing Manipulation Details (indicate cue type and reason): max A sit to stand. Total A for hygiene             Vision Patient Visual Report: No change from baseline            Cognition   Behavior During Therapy: WFL for tasks assessed/performed Overall Cognitive Status: Within Functional Limits for tasks assessed                                                     Pertinent Vitals/ Pain       Pain Score: 3  Pain Location: L hip with movement and WB Pain Descriptors / Indicators: Aching;Sore Pain Intervention(s): Limited activity within patient's tolerance;Monitored during session;Repositioned         Frequency  Min 2X/week        Progress Toward Goals  OT Goals(current goals can now be found in the care plan section)  Progress towards OT goals: Progressing toward goals     Plan Discharge plan remains appropriate    Co-evaluation                 AM-PAC OT "6 Clicks" Daily  Activity     Outcome Measure   Help from another person eating meals?: A Little Help from another person taking care of personal grooming?: A Little Help from another person toileting, which includes using toliet, bedpan, or urinal?: A Lot Help from another person bathing (including washing, rinsing, drying)?: A Lot Help from another person to put on and taking off regular upper body clothing?: A Little Help from another person to put on and taking off regular lower body clothing?: A Lot 6 Click Score: 15    End of Session Equipment Utilized During Treatment: Gait belt;Rolling walker  OT Visit Diagnosis: Unsteadiness on feet (R26.81);Other abnormalities of gait and mobility (R26.89);Muscle weakness (generalized) (M62.81);History of falling (Z91.81);Pain Pain - Right/Left: Left Pain - part of body: Hip   Activity Tolerance Patient tolerated  treatment well   Patient Left with call bell/phone within reach;in chair   Nurse Communication Mobility status        Time: 7893-8101 OT Time Calculation (min): 13 min  Charges: OT General Charges $OT Visit: 1 Visit OT Treatments $Self Care/Home Management : 8-22 mins  Kari Baars, [place] [phone] Office- [place], Edwena Felty D 11/04/2018, 5:26 PM

## 2018-11-04 NOTE — Plan of Care (Signed)
  Problem: Education: Goal: Knowledge of General Education information will improve Description: Including pain rating scale, medication(s)/side effects and non-pharmacologic comfort measures 11/04/2018 0447 by Sheilah Mins, RN Outcome: Progressing 11/04/2018 0447 by Sheilah Mins, RN Outcome: Progressing   Problem: Nutrition: Goal: Adequate nutrition will be maintained 11/04/2018 0447 by Sheilah Mins, RN Outcome: Progressing 11/04/2018 [place] by Sheilah Mins, RN Outcome: Progressing   Problem: Coping: Goal: Level of anxiety will decrease 11/04/2018 0447 by Sheilah Mins, RN Outcome: Progressing 11/04/2018 [place] by Sheilah Mins, RN Outcome: Progressing   Problem: Pain Managment: Goal: General experience of comfort will improve 11/04/2018 0447 by Sheilah Mins, RN Outcome: Progressing

## 2018-11-05 DIAGNOSIS — M62838 Other muscle spasm: Secondary | ICD-10-CM | POA: Diagnosis not present

## 2018-11-05 DIAGNOSIS — M6281 Muscle weakness (generalized): Secondary | ICD-10-CM | POA: Diagnosis not present

## 2018-11-05 DIAGNOSIS — G8929 Other chronic pain: Secondary | ICD-10-CM | POA: Diagnosis not present

## 2018-11-05 DIAGNOSIS — K5900 Constipation, unspecified: Secondary | ICD-10-CM | POA: Diagnosis not present

## 2018-11-05 DIAGNOSIS — S72002A Fracture of unspecified part of neck of left femur, initial encounter for closed fracture: Secondary | ICD-10-CM | POA: Diagnosis not present

## 2018-11-05 DIAGNOSIS — E559 Vitamin D deficiency, unspecified: Secondary | ICD-10-CM | POA: Diagnosis not present

## 2018-11-05 DIAGNOSIS — H04129 Dry eye syndrome of unspecified lacrimal gland: Secondary | ICD-10-CM | POA: Diagnosis not present

## 2018-11-05 DIAGNOSIS — I10 Essential (primary) hypertension: Secondary | ICD-10-CM | POA: Diagnosis not present

## 2018-11-05 DIAGNOSIS — R609 Edema, unspecified: Secondary | ICD-10-CM | POA: Diagnosis not present

## 2018-11-05 DIAGNOSIS — R42 Dizziness and giddiness: Secondary | ICD-10-CM | POA: Diagnosis not present

## 2018-11-05 NOTE — Plan of Care (Signed)

## 2018-11-05 NOTE — Plan of Care (Signed)
  Problem: Education: Goal: Knowledge of General Education information will improve Description: Including pain rating scale, medication(s)/side effects and non-pharmacologic comfort measures Outcome: Progressing   Problem: Clinical Measurements: Goal: Respiratory complications will improve Outcome: Progressing Goal: Cardiovascular complication will be avoided Outcome: Progressing   Problem: Activity: Goal: Risk for activity intolerance will decrease Outcome: Progressing   Problem: Coping: Goal: Level of anxiety will decrease Outcome: Progressing   Problem: Elimination: Goal: Will not experience complications related to bowel motility Outcome: Progressing Goal: Will not experience complications related to urinary retention Outcome: Progressing   Problem: Pain Managment: Goal: General experience of comfort will improve Outcome: Progressing   Problem: Safety: Goal: Ability to remain free from injury will improve Outcome: Progressing   

## 2018-11-05 NOTE — TOC Progression Note (Signed)
Transition of Care [facility]) - Progression Note    Patient Details  Name: [name] MRN: [id number] Date of Birth: 09/09/1922  Transition of Care [facility]) CM/SW Contact  Leeroy Cha, RN Phone Number: 11/05/2018, 9:52 AM  Clinical Narrative:    tct-janie [place] at [facility] pending at [facility] wcb asap with [place] number.   Expected Discharge Plan: Skilled Nursing Facility Barriers to Discharge: Insurance Authorization  Expected Discharge Plan and Services Expected Discharge Plan: [place] In-house Referral: Clinical Social Work   Post Acute Care Choice: [place] Living arrangements for the past 2 months: [place]                                       Social Determinants of Health (SDOH) Interventions    Readmission Risk Interventions No flowsheet data found.

## 2018-11-05 NOTE — Progress Notes (Signed)
PROGRESS NOTE    [name]  ZOX:[id number] DOB: 03/10/1922 DOA: 10/31/2018 PCP: Lajean Manes, MD   Brief Narrative:  [name] a 85 y.o.femalewith medical history significant ofhtn, right hip fracture history of breast cancer admitted after a fall with complaints of left hip pain. Patient at baseline walks with a walker she tripped on the rug and fell landing on her left hip. She was unable to bear weight on the left leg.X-ray done in the emergency department showed acute left intertrochanteric fracture.Orthopedics consulted and she underwent ORIF. PT/OT evaluation requested.    Assessment & Plan:   Active Problems:   Hip fracture (HCC)   Accelerated hypertension   Left intertrochanteric hip fracture: Status post ORIF. Postop day5. PT/OT recs SNF. Continue DVT prophylaxis with Lovenox .Orthopedics recommending to discharge her on aspirin 81 mg twice daily. Pain management. Bowel regimen. She will follow-up with orthopedics as an outpatient in 2 weeks. She is from [place] living apartment. Lives with her husband. Needs rehab on discharge-COVID NEG. continue to work with physical therapy for gait mobility balance transfer and strength training, social worker consulted-PENDING AUTHORIZATION  History of hypertension: Continue Norvasc. Current blood pressure stable  Macrocytosis:Vitamin B12 and folate level normal.  Leukocytosis:Most likely reactive. No indication for antibiotic therapy.  DVT prophylaxis: Lovenox SQ  Code Status: DNR    Code Status Orders  (From admission, onward)         Start     Ordered   10/31/18 1616  Do not attempt resuscitation (DNR)  Continuous    Question Answer Comment  In the event of cardiac or respiratory ARREST Do not call a code blue   In the event of cardiac or respiratory ARREST Do not perform Intubation, CPR, defibrillation or ACLS   In the event of cardiac or respiratory ARREST Use medication  by any route, position, wound care, and other measures to relive pain and suffering. May use oxygen, suction and manual treatment of airway obstruction as needed for comfort.      10/31/18 1616        Code Status History    Date Active Date Inactive Code Status Order ID Comments User Context   07/10/2018 0005 07/15/2018 1628 DNR [id number]  Toy Baker, MD ED   Advance Care Planning Activity    Advance Directive Documentation     Most Recent Value  Type of Advance Directive  Healthcare Power of Attorney, Living will  Pre-existing out of facility DNR order (yellow form or pink MOST form)  --  "MOST" Form in Place?  --     Family Communication: None today Disposition Plan:   Patient remained inpatient.  Continue with PT postoperatively for gait mobility balance strength and transfer training.  Patient will continue postoperative physical therapy and skilled nursing facility.  Patient is not safe for discharge to home Consults called: None Admission status: Inpatient   Consultants:   None  Procedures:  Dg Chest Port 1 View  Result Date: 10/31/2018 CLINICAL DATA:  Fall this morning. Left hip pain. Some shortness of breath. EXAM: PORTABLE CHEST 1 VIEW COMPARISON:  07/09/2018. FINDINGS: Cardiac silhouette normal in size. No mediastinal or hilar masses or evidence of adenopathy. Clear lungs.  No pleural effusion or pneumothorax. Skeletal structures are grossly intact. IMPRESSION: No active disease. Electronically Signed   By: Lajean Manes M.D.   On: 10/31/2018 13:11   Dg C-arm 1-60 Min-no Report  Result Date: 10/31/2018 Fluoroscopy was utilized by the requesting physician.  No radiographic  interpretation.   Dg Hip Unilat With Pelvis 2-3 Views Left  Result Date: 10/31/2018 CLINICAL DATA:  Left hip pain after trip and fall this morning. Initial encounter. EXAM: DG HIP (WITH OR WITHOUT PELVIS) 2-3V LEFT COMPARISON:  Plain films right hip 07/09/2018. FINDINGS: The patient has an  acute left intertrochanteric fracture. Hip screw and short intramedullary nail fix the right intertrochanteric fracture seen on the prior examination. The fracture appears to be a partial union. The exam is otherwise negative. IMPRESSION: Acute left intertrochanteric fracture. Status post fixation of a right intertrochanteric fracture which appears to be a partial union. Electronically Signed   By: Inge Rise M.D.   On: 10/31/2018 13:10   Dg Femur 1v Left  Result Date: 10/31/2018 CLINICAL DATA:  IM nail left hip EXAM: LEFT FEMUR 1 VIEW COMPARISON:  10/31/2018 FINDINGS: Two low resolution intraoperative spot views of the left hip. Total fluoroscopy time was 1 minutes 10 seconds. There is intramedullary rod and screw fixation of the left femur for comminuted intertrochanteric fracture. IMPRESSION: Intraoperative fluoroscopic assistance provided during surgical fixation of proximal left femoral fracture Electronically Signed   By: Donavan Foil M.D.   On: 10/31/2018 20:51     Antimicrobials:   None   Subjective: Doing well, does report some muscle cramps on surgical extremity Using Robaxin with some relief  Objective: Vitals:   11/05/18 0737 11/05/18 0832 11/05/18 1055 11/05/18 1056  BP: (!) 152/56  (!) 72/50 (!) 139/59  Pulse:   92 81  Resp:      Temp:      TempSrc:      SpO2:  93% 100% 100%  Weight:      Height:        Intake/Output Summary (Last 24 hours) at 11/05/2018 1505 Last data filed at 11/05/2018 1255 Gross per 24 hour  Intake 600 ml  Output 1950 ml  Net -1350 ml   Filed Weights   10/31/18 1112 10/31/18 1113  Weight: 70.8 kg 71.4 kg    Examination:  General exam: Appears calm and comfortable  Respiratory system: Clear to auscultation. Respiratory effort normal. Cardiovascular system: S1 & S2 heard, RRR. No JVD, murmurs, rubs, gallops or clicks. No pedal edema. Gastrointestinal system: Abdomen is nondistended, soft and nontender. No organomegaly or masses  felt. Normal bowel sounds heard. Central nervous system: Alert and oriented. No focal neurological deficits. Extremities: Warm well perfused, no edema. Skin: No rashes, lesions or ulcers Psychiatry: Judgement and insight appear normal. Mood & affect appropriate.     Data Reviewed: I have personally reviewed following labs and imaging studies  CBC: Recent Labs  Lab 10/31/18 1200 11/01/18 0332 11/02/18 0251  WBC 7.9 13.1* 8.3  NEUTROABS 5.0  --  5.1  HGB 13.0 10.1* 8.1*  HCT 40.8 31.8* 26.0*  MCV 101.0* 102.6* 102.8*  PLT 339 243 283   Basic Metabolic Panel: Recent Labs  Lab 10/31/18 1200 11/01/18 0332  NA 138 135  K 4.7 5.1  CL 103 105  CO2 24 21*  GLUCOSE 122* 159*  BUN 22 19  CREATININE 0.67 0.63  CALCIUM 8.9 8.4*  MG 2.2  --    GFR: Estimated Creatinine Clearance: 41.7 mL/min (by C-G formula based on SCr of 0.63 mg/dL). Liver Function Tests: Recent Labs  Lab 11/01/18 0332  AST 22  ALT 17  ALKPHOS 52  BILITOT 0.5  PROT 5.6*  ALBUMIN 3.2*   No results for input(s): LIPASE, AMYLASE in the last 168 hours. No results for  input(s): AMMONIA in the last 168 hours. Coagulation Profile: Recent Labs  Lab 10/31/18 1200  INR 1.1   Cardiac Enzymes: No results for input(s): CKTOTAL, CKMB, CKMBINDEX, TROPONINI in the last 168 hours. BNP (last 3 results) No results for input(s): PROBNP in the last 8760 hours. HbA1C: No results for input(s): HGBA1C in the last 72 hours. CBG: No results for input(s): GLUCAP in the last 168 hours. Lipid Profile: No results for input(s): CHOL, HDL, LDLCALC, TRIG, CHOLHDL, LDLDIRECT in the last 72 hours. Thyroid Function Tests: No results for input(s): TSH, T4TOTAL, FREET4, T3FREE, THYROIDAB in the last 72 hours. Anemia Panel: No results for input(s): VITAMINB12, FOLATE, FERRITIN, TIBC, IRON, RETICCTPCT in the last 72 hours. Sepsis Labs: No results for input(s): PROCALCITON, LATICACIDVEN in the last 168 hours.  Recent Results  (from the past 240 hour(s))  SARS Coronavirus 2 by RT PCR (hospital order, performed in [facility] hospital lab) Nasopharyngeal Nasopharyngeal Swab     Status: None   Collection Time: 10/31/18  3:41 PM   Specimen: Nasopharyngeal Swab  Result Value Ref Range Status   SARS Coronavirus 2 NEGATIVE NEGATIVE Final    Comment: (NOTE) If result is NEGATIVE SARS-CoV-2 target nucleic acids are NOT DETECTED. The SARS-CoV-2 RNA is generally detectable in upper and lower  respiratory specimens during the acute phase of infection. The lowest  concentration of SARS-CoV-2 viral copies this assay can detect is 250  copies / mL. A negative result does not preclude SARS-CoV-2 infection  and should not be used as the sole basis for treatment or other  patient management decisions.  A negative result may occur with  improper specimen collection / handling, submission of specimen other  than nasopharyngeal swab, presence of viral mutation(s) within the  areas targeted by this assay, and inadequate number of viral copies  (<250 copies / mL). A negative result must be combined with clinical  observations, patient history, and epidemiological information. If result is POSITIVE SARS-CoV-2 target nucleic acids are DETECTED. The SARS-CoV-2 RNA is generally detectable in upper and lower  respiratory specimens dur ing the acute phase of infection.  Positive  results are indicative of active infection with SARS-CoV-2.  Clinical  correlation with patient history and other diagnostic information is  necessary to determine patient infection status.  Positive results do  not rule out bacterial infection or co-infection with other viruses. If result is PRESUMPTIVE POSTIVE SARS-CoV-2 nucleic acids MAY BE PRESENT.   A presumptive positive result was obtained on the submitted specimen  and confirmed on repeat testing.  While 2019 novel coronavirus  (SARS-CoV-2) nucleic acids may be present in the submitted sample    additional confirmatory testing may be necessary for epidemiological  and / or clinical management purposes  to differentiate between  SARS-CoV-2 and other Sarbecovirus currently known to infect humans.  If clinically indicated additional testing with an alternate test  methodology [phone]) is advised. The SARS-CoV-2 RNA is generally  detectable in upper and lower respiratory sp ecimens during the acute  phase of infection. The expected result is Negative. Fact Sheet for Patients:  StrictlyIdeas.no Fact Sheet for Healthcare Providers: BankingDealers.co.za This test is not yet approved or cleared by the [place] FDA and has been authorized for detection and/or diagnosis of SARS-CoV-2 by FDA under an Emergency Use Authorization (EUA).  This EUA will remain in effect (meaning this test can be used) for the duration of the COVID-19 declaration under Section 564(b)(1) of the Act, 21 U.S.C. section 360bbb-3(b)(1), unless  the authorization is terminated or revoked sooner. Performed at [facility], [place] [address], [place], [place] [address]   Surgical pcr screen     Status: None   Collection Time: 10/31/18  6:49 PM   Specimen: Nasal Mucosa; Nasal Swab  Result Value Ref Range Status   MRSA, PCR NEGATIVE NEGATIVE Final   Staphylococcus aureus NEGATIVE NEGATIVE Final    Comment: (NOTE) The Xpert SA Assay (FDA approved for NASAL specimens in patients 83 years of age and older), is one component of a comprehensive surveillance program. It is not intended to diagnose infection nor to guide or monitor treatment. Performed at [facility], [place] [address] [place], [place] [address]   SARS CORONAVIRUS 2 (TAT 6-24 HRS) Nasopharyngeal Nasopharyngeal Swab     Status: None   Collection Time: 11/02/18 11:24 AM   Specimen: Nasopharyngeal Swab  Result Value Ref Range Status   SARS Coronavirus 2 NEGATIVE  NEGATIVE Final    Comment: (NOTE) SARS-CoV-2 target nucleic acids are NOT DETECTED. The SARS-CoV-2 RNA is generally detectable in upper and lower respiratory specimens during the acute phase of infection. Negative results do not preclude SARS-CoV-2 infection, do not rule out co-infections with other pathogens, and should not be used as the sole basis for treatment or other patient management decisions. Negative results must be combined with clinical observations, patient history, and epidemiological information. The expected result is Negative. Fact Sheet for Patients: SugarRoll.be Fact Sheet for Healthcare Providers: https://www.woods-mathews.com/ This test is not yet approved or cleared by the [place] FDA and  has been authorized for detection and/or diagnosis of SARS-CoV-2 by FDA under an Emergency Use Authorization (EUA). This EUA will remain  in effect (meaning this test can be used) for the duration of the COVID-19 declaration under Section 56 4(b)(1) of the Act, 21 U.S.C. section 360bbb-3(b)(1), unless the authorization is terminated or revoked sooner. Performed at [facility], [place] [address], [place], [place] [address]          Radiology Studies: No results found.      Scheduled Meds:  acetaminophen  650 mg Oral TID   amLODipine  2.5 mg Oral Daily   docusate sodium  200 mg Oral Daily   enoxaparin (LOVENOX) injection  30 mg Subcutaneous Q24H   meclizine  25 mg Oral Daily   Continuous Infusions:  lactated ringers 10 mL/hr at 10/31/18 1848     LOS: 5 days    Time spent: [place], MD [facility]  If 7PM-7AM, please contact night-coverage  11/05/2018, 3:05 PM

## 2018-11-06 DIAGNOSIS — S72002A Fracture of unspecified part of neck of left femur, initial encounter for closed fracture: Secondary | ICD-10-CM | POA: Diagnosis not present

## 2018-11-06 DIAGNOSIS — I10 Essential (primary) hypertension: Secondary | ICD-10-CM | POA: Diagnosis not present

## 2018-11-06 LAB — SARS CORONAVIRUS 2 (TAT 6-24 HRS): SARS Coronavirus 2: NEGATIVE

## 2018-11-06 MED ORDER — TRAMADOL HCL 50 MG PO TABS: 50.0000 mg | ORAL_TABLET | Freq: Four times a day (QID) | ORAL | 0 refills | Status: AC | PRN

## 2018-11-06 MED ORDER — ENOXAPARIN SODIUM 30 MG/0.3ML ~~LOC~~ SOLN: 30.0000 mg | SUBCUTANEOUS | 0 refills | Status: DC

## 2018-11-06 NOTE — Plan of Care (Signed)

## 2018-11-06 NOTE — Progress Notes (Signed)
PROGRESS NOTE    [name]  WUJ:[id number] DOB: [birth year] DOA: 10/31/2018 PCP: Lajean Manes, MD   Brief Narrative:  [name] a 85 y.o.femalewith medical history significant ofhtn, right hip fracture history of breast cancer admitted after a fall with complaints of left hip pain. Patient at baseline walks with a walker she tripped on the rug and fell landing on her left hip. She was unable to bear weight on the left leg.X-ray done in the emergency department showed acute left intertrochanteric fracture.Orthopedics consulted and she underwent ORIF. PT/OT evaluation requested.   Assessment & Plan:   Active Problems:   Hip fracture (HCC)   Accelerated hypertension   Left intertrochanteric hip fracture: Status post ORIF. Postop day6. PT/OTrecs SNF. Continue DVT prophylaxis with Lovenox .Orthopedics recommending to discharge her on aspirin 81 mg twice daily. Pain management. Bowel regimen. She will follow-up with orthopedics as an outpatient in 2 weeks. She is from [place] living apartment. Lives with her husband. Needs rehab on discharge-COVIDNEG. continue to work with physical therapy for gait mobility balance transfer and strength training, social worker consulted-PENDING AUTHORIZATION  History of hypertension: Continue Norvasc. Current blood pressure stable  Macrocytosis:Vitamin B12 and folate level normal.  Leukocytosis:Most likely reactive. No indication for antibiotic therapy.  DVT prophylaxis: Lovenox SQ  Code Status: Full    Code Status Orders  (From admission, onward)         Start     Ordered   10/31/18 1616  Do not attempt resuscitation (DNR)  Continuous    Question Answer Comment  In the event of cardiac or respiratory ARREST Do not call a code blue   In the event of cardiac or respiratory ARREST Do not perform Intubation, CPR, defibrillation or ACLS   In the event of cardiac or respiratory ARREST Use medication  by any route, position, wound care, and other measures to relive pain and suffering. May use oxygen, suction and manual treatment of airway obstruction as needed for comfort.      10/31/18 1616        Code Status History    Date Active Date Inactive Code Status Order ID Comments User Context   07/10/2018 0005 07/15/2018 1628 DNR [id number]  Toy Baker, MD ED   Advance Care Planning Activity    Advance Directive Documentation     Most Recent Value  Type of Advance Directive  Healthcare Power of Attorney, Living will  Pre-existing out of facility DNR order (yellow form or pink MOST form)  --  "MOST" Form in Place?  --     Family Communication: Discussed in detail with patient Disposition Plan:   Patient remained inpatient for continued PT postoperatively for gait mobility balance strength and transfer training.  We will continue postoperative physical therapy with plan skilled nursing facility for continuation of treatment.  Patient not safe for discharge to home today Consults called: None Admission status: Inpatient   Consultants:   Orthopedics  Procedures:  Dg Chest Port 1 View  Result Date: 10/31/2018 CLINICAL DATA:  Fall this morning. Left hip pain. Some shortness of breath. EXAM: PORTABLE CHEST 1 VIEW COMPARISON:  07/09/2018. FINDINGS: Cardiac silhouette normal in size. No mediastinal or hilar masses or evidence of adenopathy. Clear lungs.  No pleural effusion or pneumothorax. Skeletal structures are grossly intact. IMPRESSION: No active disease. Electronically Signed   By: Lajean Manes M.D.   On: 10/31/2018 13:11   Dg C-arm 1-60 Min-no Report  Result Date: 10/31/2018 Fluoroscopy was utilized by the requesting  physician.  No radiographic interpretation.   Dg Hip Unilat With Pelvis 2-3 Views Left  Result Date: 10/31/2018 CLINICAL DATA:  Left hip pain after trip and fall this morning. Initial encounter. EXAM: DG HIP (WITH OR WITHOUT PELVIS) 2-3V LEFT COMPARISON:   Plain films right hip 07/09/2018. FINDINGS: The patient has an acute left intertrochanteric fracture. Hip screw and short intramedullary nail fix the right intertrochanteric fracture seen on the prior examination. The fracture appears to be a partial union. The exam is otherwise negative. IMPRESSION: Acute left intertrochanteric fracture. Status post fixation of a right intertrochanteric fracture which appears to be a partial union. Electronically Signed   By: Inge Rise M.D.   On: 10/31/2018 13:10   Dg Femur 1v Left  Result Date: 10/31/2018 CLINICAL DATA:  IM nail left hip EXAM: LEFT FEMUR 1 VIEW COMPARISON:  10/31/2018 FINDINGS: Two low resolution intraoperative spot views of the left hip. Total fluoroscopy time was 1 minutes 10 seconds. There is intramedullary rod and screw fixation of the left femur for comminuted intertrochanteric fracture. IMPRESSION: Intraoperative fluoroscopic assistance provided during surgical fixation of proximal left femoral fracture Electronically Signed   By: Donavan Foil M.D.   On: 10/31/2018 20:51     Antimicrobials:   None   Subjective: Patient continues to do well does report some spasming with activity with physical therapy.  Otherwise no events and no complaints  Objective: Vitals:   11/05/18 2204 11/06/18 0543 11/06/18 0942 11/06/18 1000  BP: (!) 119/53 (!) 154/67 (!) 123/104 (!) 127/91  Pulse: 84 93 86 86  Resp: 17 17  (!) 24  Temp: 98.3 F (36.8 C) 98.1 F (36.7 C)    TempSrc:      SpO2: 96% 96% 100% 100%  Weight:      Height:        Intake/Output Summary (Last 24 hours) at 11/06/2018 1258 Last data filed at 11/06/2018 1230 Gross per 24 hour  Intake 960 ml  Output 850 ml  Net 110 ml   Filed Weights   10/31/18 1112 10/31/18 1113  Weight: 70.8 kg 71.4 kg    Examination:  General exam: Appears calm and comfortable  Respiratory system: Clear to auscultation. Respiratory effort normal. Cardiovascular system: S1 & S2 heard, RRR.  No JVD, murmurs, rubs, gallops or clicks. No pedal edema. Gastrointestinal system: Abdomen is nondistended, soft and nontender. No organomegaly or masses felt. Normal bowel sounds heard. Central nervous system: Alert and oriented. No focal neurological deficits. Extremities: Warm well perfused, no edema. Skin: No rashes, lesions or ulcers Psychiatry: Judgement and insight appear normal. Mood & affect appropriate. .     Data Reviewed: I have personally reviewed following labs and imaging studies  CBC: Recent Labs  Lab 10/31/18 1200 11/01/18 0332 11/02/18 0251  WBC 7.9 13.1* 8.3  NEUTROABS 5.0  --  5.1  HGB 13.0 10.1* 8.1*  HCT 40.8 31.8* 26.0*  MCV 101.0* 102.6* 102.8*  PLT 339 243 740   Basic Metabolic Panel: Recent Labs  Lab 10/31/18 1200 11/01/18 0332  NA 138 135  K 4.7 5.1  CL 103 105  CO2 24 21*  GLUCOSE 122* 159*  BUN 22 19  CREATININE 0.67 0.63  CALCIUM 8.9 8.4*  MG 2.2  --    GFR: Estimated Creatinine Clearance: 41.7 mL/min (by C-G formula based on SCr of 0.63 mg/dL). Liver Function Tests: Recent Labs  Lab 11/01/18 0332  AST 22  ALT 17  ALKPHOS 52  BILITOT 0.5  PROT 5.6*  ALBUMIN 3.2*   No results for input(s): LIPASE, AMYLASE in the last 168 hours. No results for input(s): AMMONIA in the last 168 hours. Coagulation Profile: Recent Labs  Lab 10/31/18 1200  INR 1.1   Cardiac Enzymes: No results for input(s): CKTOTAL, CKMB, CKMBINDEX, TROPONINI in the last 168 hours. BNP (last 3 results) No results for input(s): PROBNP in the last 8760 hours. HbA1C: No results for input(s): HGBA1C in the last 72 hours. CBG: No results for input(s): GLUCAP in the last 168 hours. Lipid Profile: No results for input(s): CHOL, HDL, LDLCALC, TRIG, CHOLHDL, LDLDIRECT in the last 72 hours. Thyroid Function Tests: No results for input(s): TSH, T4TOTAL, FREET4, T3FREE, THYROIDAB in the last 72 hours. Anemia Panel: No results for input(s): VITAMINB12, FOLATE,  FERRITIN, TIBC, IRON, RETICCTPCT in the last 72 hours. Sepsis Labs: No results for input(s): PROCALCITON, LATICACIDVEN in the last 168 hours.  Recent Results (from the past 240 hour(s))  SARS Coronavirus 2 by RT PCR (hospital order, performed in [facility] hospital lab) Nasopharyngeal Nasopharyngeal Swab     Status: None   Collection Time: 10/31/18  3:41 PM   Specimen: Nasopharyngeal Swab  Result Value Ref Range Status   SARS Coronavirus 2 NEGATIVE NEGATIVE Final    Comment: (NOTE) If result is NEGATIVE SARS-CoV-2 target nucleic acids are NOT DETECTED. The SARS-CoV-2 RNA is generally detectable in upper and lower  respiratory specimens during the acute phase of infection. The lowest  concentration of SARS-CoV-2 viral copies this assay can detect is 250  copies / mL. A negative result does not preclude SARS-CoV-2 infection  and should not be used as the sole basis for treatment or other  patient management decisions.  A negative result may occur with  improper specimen collection / handling, submission of specimen other  than nasopharyngeal swab, presence of viral mutation(s) within the  areas targeted by this assay, and inadequate number of viral copies  (<250 copies / mL). A negative result must be combined with clinical  observations, patient history, and epidemiological information. If result is POSITIVE SARS-CoV-2 target nucleic acids are DETECTED. The SARS-CoV-2 RNA is generally detectable in upper and lower  respiratory specimens dur ing the acute phase of infection.  Positive  results are indicative of active infection with SARS-CoV-2.  Clinical  correlation with patient history and other diagnostic information is  necessary to determine patient infection status.  Positive results do  not rule out bacterial infection or co-infection with other viruses. If result is PRESUMPTIVE POSTIVE SARS-CoV-2 nucleic acids MAY BE PRESENT.   A presumptive positive result was obtained on  the submitted specimen  and confirmed on repeat testing.  While 2019 novel coronavirus  (SARS-CoV-2) nucleic acids may be present in the submitted sample  additional confirmatory testing may be necessary for epidemiological  and / or clinical management purposes  to differentiate between  SARS-CoV-2 and other Sarbecovirus currently known to infect humans.  If clinically indicated additional testing with an alternate test  methodology [phone]) is advised. The SARS-CoV-2 RNA is generally  detectable in upper and lower respiratory sp ecimens during the acute  phase of infection. The expected result is Negative. Fact Sheet for Patients:  StrictlyIdeas.no Fact Sheet for Healthcare Providers: BankingDealers.co.za This test is not yet approved or cleared by the [place] FDA and has been authorized for detection and/or diagnosis of SARS-CoV-2 by FDA under an Emergency Use Authorization (EUA).  This EUA will remain in effect (meaning this test can be used) for  the duration of the COVID-19 declaration under Section 564(b)(1) of the Act, 21 U.S.C. section 360bbb-3(b)(1), unless the authorization is terminated or revoked sooner. Performed at [facility], [place] [address], [place], [place] [address]   Surgical pcr screen     Status: None   Collection Time: 10/31/18  6:49 PM   Specimen: Nasal Mucosa; Nasal Swab  Result Value Ref Range Status   MRSA, PCR NEGATIVE NEGATIVE Final   Staphylococcus aureus NEGATIVE NEGATIVE Final    Comment: (NOTE) The Xpert SA Assay (FDA approved for NASAL specimens in patients 16 years of age and older), is one component of a comprehensive surveillance program. It is not intended to diagnose infection nor to guide or monitor treatment. Performed at [facility], [place] [address], [place], [place] [address]   SARS CORONAVIRUS 2 (TAT 6-24 HRS) Nasopharyngeal Nasopharyngeal Swab      Status: None   Collection Time: 11/02/18 11:24 AM   Specimen: Nasopharyngeal Swab  Result Value Ref Range Status   SARS Coronavirus 2 NEGATIVE NEGATIVE Final    Comment: (NOTE) SARS-CoV-2 target nucleic acids are NOT DETECTED. The SARS-CoV-2 RNA is generally detectable in upper and lower respiratory specimens during the acute phase of infection. Negative results do not preclude SARS-CoV-2 infection, do not rule out co-infections with other pathogens, and should not be used as the sole basis for treatment or other patient management decisions. Negative results must be combined with clinical observations, patient history, and epidemiological information. The expected result is Negative. Fact Sheet for Patients: SugarRoll.be Fact Sheet for Healthcare Providers: https://www.woods-mathews.com/ This test is not yet approved or cleared by the [place] FDA and  has been authorized for detection and/or diagnosis of SARS-CoV-2 by FDA under an Emergency Use Authorization (EUA). This EUA will remain  in effect (meaning this test can be used) for the duration of the COVID-19 declaration under Section 56 4(b)(1) of the Act, 21 U.S.C. section 360bbb-3(b)(1), unless the authorization is terminated or revoked sooner. Performed at [facility], [place] [address] [place], [place] [address]          Radiology Studies: No results found.      Scheduled Meds:  acetaminophen  650 mg Oral TID   amLODipine  2.5 mg Oral Daily   docusate sodium  200 mg Oral Daily   enoxaparin (LOVENOX) injection  30 mg Subcutaneous Q24H   meclizine  25 mg Oral Daily   Continuous Infusions:  lactated ringers 10 mL/hr at 10/31/18 1848     LOS: 6 days    Time spent: 85 min    Nicolette Bang, MD [facility]  If 7PM-7AM, please contact night-coverage  11/06/2018, 12:58 PM

## 2018-11-06 NOTE — Progress Notes (Signed)
Physical Therapy Treatment Patient Details Name: [name] MRN: [id number] DOB: 1922-08-28 Today's Date: 11/06/2018    History of Present Illness Pt s/p fall with L hip fx and now s/p IM nailing.  Pt with hx of breast CA, "birth injury" limiting use of R UE and R hip fx s/p IM nailing 7/20    PT Comments    Pt continues very cooperative but progressing slowly and requiring significant assist for safe performance of all mobility tasks.     Follow Up Recommendations  SNF     Equipment Recommendations  None recommended by PT    Recommendations for Other Services       Precautions / Restrictions Precautions Precautions: Fall Restrictions Weight Bearing Restrictions: No Other Position/Activity Restrictions: WBAT    Mobility  Bed Mobility Overal bed mobility: Needs Assistance Bed Mobility: Supine to Sit     Supine to sit: +2 for physical assistance;+2 for safety/equipment;Mod assist     General bed mobility comments: increased time with cues for sequence and use of R LE to self assist  Transfers Overall transfer level: Needs assistance Equipment used: Rolling walker (2 wheeled);None Transfers: Sit to/from Stand Sit to Stand: Min assist;Mod assist;+2 physical assistance;From elevated surface         General transfer comment: cues for transition position, LE management and use of UEs to self assist`  Ambulation/Gait Ambulation/Gait assistance: Mod assist;+2 physical assistance;+2 safety/equipment Gait Distance (Feet): 2 Feet Assistive device: Rolling walker (2 wheeled) Gait Pattern/deviations: Step-to pattern;Decreased step length - right;Decreased step length - left;Shuffle;Trunk flexed Gait velocity: decreased   General Gait Details: cues for sequence, posture and position from RW.  Physical assist required for balance/support and RW management   Stairs             Wheelchair Mobility    Modified Rankin (Stroke Patients Only)        Balance Overall balance assessment: Needs assistance Sitting-balance support: Bilateral upper extremity supported;Feet supported Sitting balance-Leahy Scale: Fair     Standing balance support: Bilateral upper extremity supported Standing balance-Leahy Scale: Poor                              Cognition Arousal/Alertness: Awake/alert Behavior During Therapy: WFL for tasks assessed/performed Overall Cognitive Status: Within Functional Limits for tasks assessed                                        Exercises General Exercises - Lower Extremity Ankle Circles/Pumps: AROM;Both;15 reps;Supine Quad Sets: AROM;Both;10 reps;Supine Short Arc Quad: AROM;Left;10 reps;Supine Heel Slides: AAROM;Left;15 reps;Supine Hip ABduction/ADduction: AAROM;Left;10 reps;Supine    General Comments        Pertinent Vitals/Pain Pain Assessment: 0-10 Pain Score: 4  Pain Location: L hip with movement and WB Pain Descriptors / Indicators: Aching;Sore Pain Intervention(s): Limited activity within patient's tolerance;Monitored during session;Premedicated before session;Ice applied    Home Living                      Prior Function            PT Goals (current goals can now be found in the care plan section) Acute Rehab PT Goals Patient Stated Goal: move and feel better PT Goal Formulation: With patient Time For Goal Achievement: 11/15/18 Potential to Achieve Goals: Fair Progress towards PT goals: Progressing toward  goals    Frequency    Min 3X/week      PT Plan Current plan remains appropriate    Co-evaluation              AM-PAC PT "6 Clicks" Mobility   Outcome Measure  Help needed turning from your back to your side while in a flat bed without using bedrails?: A Lot Help needed moving from lying on your back to sitting on the side of a flat bed without using bedrails?: A Lot Help needed moving to and from a bed to a chair (including a  wheelchair)?: A Lot Help needed standing up from a chair using your arms (e.g., wheelchair or bedside chair)?: A Lot Help needed to walk in hospital room?: A Lot Help needed climbing 3-5 steps with a railing? : Total 6 Click Score: 11    End of Session Equipment Utilized During Treatment: Gait belt Activity Tolerance: Patient limited by fatigue;Patient limited by pain Patient left: in chair;with call bell/phone within reach Nurse Communication: Mobility status PT Visit Diagnosis: Difficulty in walking, not elsewhere classified (R26.2);Muscle weakness (generalized) (M62.81)     Time: 0828-0900 PT Time Calculation (min) (ACUTE ONLY): 32 min  Charges:  $Gait Training: 8-22 mins $Therapeutic Exercise: 8-22 mins                     [place] Pager [phone] Office [phone]    [facility] 11/06/2018, 4:39 PM

## 2018-11-06 NOTE — Progress Notes (Signed)
Physical Therapy Treatment Patient Details Name: [name] MRN: [id number] DOB: [birth year] Today's Date: 11/06/2018    History of Present Illness Pt s/p fall with L hip fx and now s/p IM nailing.  Pt with hx of breast CA, "birth injury" limiting use of R UE and R hip fx s/p IM nailing 7/20    PT Comments    Pt continues very cooperative but progressing slowly and requiring significant assist for safe performance of all mobility tasks.   Pt is currently very high fall risk and would benefit from follow up rehab at SNF level to maximize IND and SAFETY prior to return home with 24 yr old husband.  Follow Up Recommendations  SNF     Equipment Recommendations  None recommended by PT    Recommendations for Other Services       Precautions / Restrictions Precautions Precautions: Fall Restrictions Weight Bearing Restrictions: No Other Position/Activity Restrictions: WBAT    Mobility  Bed Mobility Overal bed mobility: Needs Assistance Bed Mobility: Sit to Supine     Supine to sit: +2 for physical assistance;+2 for safety/equipment;Mod assist Sit to supine: Mod assist;Max assist;+2 for physical assistance;+2 for safety/equipment   General bed mobility comments: increased time with cues for sequence and use of R LE to self assist  Transfers Overall transfer level: Needs assistance Equipment used: Rolling walker (2 wheeled);None Transfers: Sit to/from Stand Sit to Stand: Min assist;Mod assist;+2 physical assistance;From elevated surface         General transfer comment: cues for transition position, LE management and use of UEs to self assist`  Ambulation/Gait Ambulation/Gait assistance: Mod assist;+2 physical assistance;+2 safety/equipment Gait Distance (Feet): 1 Feet Assistive device: Rolling walker (2 wheeled) Gait Pattern/deviations: Step-to pattern;Decreased step length - right;Decreased step length - left;Shuffle;Trunk flexed Gait velocity: decreased    General Gait Details: cues for sequence, posture and position from RW.  Physical assist required for balance/support and RW management   Stairs             Wheelchair Mobility    Modified Rankin (Stroke Patients Only)       Balance Overall balance assessment: Needs assistance Sitting-balance support: Bilateral upper extremity supported;Feet supported Sitting balance-Leahy Scale: Fair     Standing balance support: Bilateral upper extremity supported Standing balance-Leahy Scale: Poor                              Cognition Arousal/Alertness: Awake/alert Behavior During Therapy: WFL for tasks assessed/performed Overall Cognitive Status: Within Functional Limits for tasks assessed                                        Exercises General Exercises - Lower Extremity Ankle Circles/Pumps: AROM;Both;15 reps;Supine Quad Sets: AROM;Both;10 reps;Supine Short Arc Quad: AROM;Left;10 reps;Supine Heel Slides: AAROM;Left;15 reps;Supine Hip ABduction/ADduction: AAROM;Left;10 reps;Supine    General Comments        Pertinent Vitals/Pain Pain Assessment: 0-10 Pain Score: 6  Pain Location: L hip with movement and WB Pain Descriptors / Indicators: Aching;Sore Pain Intervention(s): Monitored during session;Limited activity within patient's tolerance;Premedicated before session;Ice applied    Home Living                      Prior Function            PT Goals (current goals can now  be found in the care plan section) Acute Rehab PT Goals Patient Stated Goal: move and feel better PT Goal Formulation: With patient Time For Goal Achievement: 11/15/18 Potential to Achieve Goals: Fair Progress towards PT goals: Progressing toward goals    Frequency    Min 3X/week      PT Plan Current plan remains appropriate    Co-evaluation              AM-PAC PT "6 Clicks" Mobility   Outcome Measure  Help needed turning from your back  to your side while in a flat bed without using bedrails?: A Lot Help needed moving from lying on your back to sitting on the side of a flat bed without using bedrails?: A Lot Help needed moving to and from a bed to a chair (including a wheelchair)?: A Lot Help needed standing up from a chair using your arms (e.g., wheelchair or bedside chair)?: A Lot Help needed to walk in hospital room?: A Lot Help needed climbing 3-5 steps with a railing? : Total 6 Click Score: 11    End of Session Equipment Utilized During Treatment: Gait belt Activity Tolerance: Patient limited by fatigue;Patient limited by pain Patient left: in bed;with call bell/phone within reach;with nursing/sitter in room Nurse Communication: Mobility status PT Visit Diagnosis: Difficulty in walking, not elsewhere classified (R26.2);Muscle weakness (generalized) (M62.81)     Time: 3875-6433 PT Time Calculation (min) (ACUTE ONLY): 14 min  Charges:  $Gait Training: 8-22 mins $Therapeutic Exercise: 8-22 mins $Therapeutic Activity: 8-22 mins                     [place] Pager [phone] Office [phone]    [facility] 11/06/2018, 4:43 PM

## 2018-11-06 NOTE — TOC Progression Note (Addendum)
Transition of Care [facility]) - Progression Note    Patient Details  Name: [name] MRN: [id number] Date of Birth: 1922/04/19  [facility]) CM/SW Contact  Leeroy Cha, RN Phone Number: 11/06/2018, 10:19 AM  Clinical Narrative:    [place] Alert message left to please call back. Authorization through [facility] obtained.  Covid pending/ Send patient to [facility] on Friday if Covid neg.  Expected Discharge Plan: Skilled Nursing Facility Barriers to Discharge: Insurance Authorization  Expected Discharge Plan and Services Expected Discharge Plan: [place] In-house Referral: Clinical Social Work   Post Acute Care Choice: [place] Living arrangements for the past 2 months: [place]                                       Social Determinants of Health (SDOH) Interventions    Readmission Risk Interventions No flowsheet data found.

## 2018-11-06 NOTE — Progress Notes (Signed)
    Subjective:  Patient reports pain as mild to moderate.  Denies N/V/CP/SOB.   Objective:   VITALS:   Vitals:   11/05/18 2204 11/06/18 0543 11/06/18 0942 11/06/18 1000  BP: (!) 119/53 (!) 154/67 (!) 123/104 (!) 127/91  Pulse: 84 93 86 86  Resp: 17 17  (!) 24  Temp: 98.3 F (36.8 C) 98.1 F (36.7 C)    TempSrc:      SpO2: 96% 96% 100% 100%  Weight:      Height:        NAD Sensation intact distally Intact pulses distally Dorsiflexion/Plantar flexion intact Incision: dressing C/D/I Compartment soft   Lab Results  Component Value Date   WBC 8.3 11/02/2018   HGB 8.1 (L) 11/02/2018   HCT 26.0 (L) 11/02/2018   MCV 102.8 (H) 11/02/2018   PLT 218 11/02/2018   BMET    Component Value Date/Time   NA 135 11/01/2018 0332   NA 140 09/01/2012 1008   K 5.1 11/01/2018 0332   K 4.7 09/01/2012 1008   CL 105 11/01/2018 0332   CL 108 (H) 11/28/2011 1030   CO2 21 (L) 11/01/2018 0332   CO2 24 09/01/2012 1008   GLUCOSE 159 (H) 11/01/2018 0332   GLUCOSE 82 09/01/2012 1008   GLUCOSE 73 11/28/2011 1030   BUN 19 11/01/2018 0332   BUN 21.0 09/01/2012 1008   CREATININE 0.63 11/01/2018 0332   CREATININE 1.0 09/01/2012 1008   CALCIUM 8.4 (L) 11/01/2018 0332   CALCIUM 9.2 09/01/2012 1008   GFRNONAA >60 11/01/2018 0332   GFRAA >60 11/01/2018 0332     Assessment/Plan: 6 Days Post-Op   Active Problems:   Hip fracture (HCC)   Accelerated hypertension   WBAT with walker DVT ppx: Lovenox, SCDs, TEDS PO pain control PT/OT Dispo: SNF placement   [name] [name] 11/06/2018, 11:44 AM   Rachael Can, MD [phone] [name] is now [name]  [facility] [address], [place], [place] [address] Phone: [phone] [email or web address] Facebook  [employer]

## 2018-11-07 DIAGNOSIS — G8929 Other chronic pain: Secondary | ICD-10-CM | POA: Diagnosis not present

## 2018-11-07 DIAGNOSIS — R2689 Other abnormalities of gait and mobility: Secondary | ICD-10-CM | POA: Diagnosis not present

## 2018-11-07 DIAGNOSIS — Z853 Personal history of malignant neoplasm of breast: Secondary | ICD-10-CM | POA: Diagnosis not present

## 2018-11-07 DIAGNOSIS — I959 Hypotension, unspecified: Secondary | ICD-10-CM | POA: Diagnosis not present

## 2018-11-07 DIAGNOSIS — S72002A Fracture of unspecified part of neck of left femur, initial encounter for closed fracture: Secondary | ICD-10-CM | POA: Diagnosis not present

## 2018-11-07 DIAGNOSIS — Z9181 History of falling: Secondary | ICD-10-CM | POA: Diagnosis not present

## 2018-11-07 DIAGNOSIS — Z7401 Bed confinement status: Secondary | ICD-10-CM | POA: Diagnosis not present

## 2018-11-07 DIAGNOSIS — N390 Urinary tract infection, site not specified: Secondary | ICD-10-CM | POA: Diagnosis not present

## 2018-11-07 DIAGNOSIS — E559 Vitamin D deficiency, unspecified: Secondary | ICD-10-CM | POA: Diagnosis not present

## 2018-11-07 DIAGNOSIS — G4700 Insomnia, unspecified: Secondary | ICD-10-CM | POA: Diagnosis not present

## 2018-11-07 DIAGNOSIS — K5900 Constipation, unspecified: Secondary | ICD-10-CM | POA: Diagnosis not present

## 2018-11-07 DIAGNOSIS — G8911 Acute pain due to trauma: Secondary | ICD-10-CM | POA: Diagnosis not present

## 2018-11-07 DIAGNOSIS — R5381 Other malaise: Secondary | ICD-10-CM | POA: Diagnosis not present

## 2018-11-07 DIAGNOSIS — D649 Anemia, unspecified: Secondary | ICD-10-CM | POA: Diagnosis not present

## 2018-11-07 DIAGNOSIS — M2550 Pain in unspecified joint: Secondary | ICD-10-CM | POA: Diagnosis not present

## 2018-11-07 DIAGNOSIS — M80052D Age-related osteoporosis with current pathological fracture, left femur, subsequent encounter for fracture with routine healing: Secondary | ICD-10-CM | POA: Diagnosis not present

## 2018-11-07 DIAGNOSIS — H04123 Dry eye syndrome of bilateral lacrimal glands: Secondary | ICD-10-CM | POA: Diagnosis not present

## 2018-11-07 DIAGNOSIS — M25571 Pain in right ankle and joints of right foot: Secondary | ICD-10-CM | POA: Diagnosis not present

## 2018-11-07 DIAGNOSIS — M79602 Pain in left arm: Secondary | ICD-10-CM | POA: Diagnosis not present

## 2018-11-07 DIAGNOSIS — Z7982 Long term (current) use of aspirin: Secondary | ICD-10-CM | POA: Diagnosis not present

## 2018-11-07 DIAGNOSIS — S72142D Displaced intertrochanteric fracture of left femur, subsequent encounter for closed fracture with routine healing: Secondary | ICD-10-CM | POA: Diagnosis not present

## 2018-11-07 DIAGNOSIS — Z111 Encounter for screening for respiratory tuberculosis: Secondary | ICD-10-CM | POA: Diagnosis not present

## 2018-11-07 DIAGNOSIS — R509 Fever, unspecified: Secondary | ICD-10-CM | POA: Diagnosis not present

## 2018-11-07 DIAGNOSIS — M6281 Muscle weakness (generalized): Secondary | ICD-10-CM | POA: Diagnosis not present

## 2018-11-07 DIAGNOSIS — M79622 Pain in left upper arm: Secondary | ICD-10-CM | POA: Diagnosis not present

## 2018-11-07 DIAGNOSIS — M9701XA Periprosthetic fracture around internal prosthetic right hip joint, initial encounter: Secondary | ICD-10-CM | POA: Diagnosis not present

## 2018-11-07 DIAGNOSIS — S72002D Fracture of unspecified part of neck of left femur, subsequent encounter for closed fracture with routine healing: Secondary | ICD-10-CM | POA: Diagnosis not present

## 2018-11-07 DIAGNOSIS — R609 Edema, unspecified: Secondary | ICD-10-CM | POA: Diagnosis not present

## 2018-11-07 DIAGNOSIS — Z7409 Other reduced mobility: Secondary | ICD-10-CM | POA: Diagnosis not present

## 2018-11-07 DIAGNOSIS — S7292XA Unspecified fracture of left femur, initial encounter for closed fracture: Secondary | ICD-10-CM | POA: Diagnosis not present

## 2018-11-07 DIAGNOSIS — I10 Essential (primary) hypertension: Secondary | ICD-10-CM | POA: Diagnosis not present

## 2018-11-07 DIAGNOSIS — S72141D Displaced intertrochanteric fracture of right femur, subsequent encounter for closed fracture with routine healing: Secondary | ICD-10-CM | POA: Diagnosis not present

## 2018-11-07 DIAGNOSIS — R52 Pain, unspecified: Secondary | ICD-10-CM | POA: Diagnosis not present

## 2018-11-07 MED ORDER — METHOCARBAMOL 500 MG PO TABS: 500.0000 mg | ORAL_TABLET | Freq: Three times a day (TID) | ORAL | 0 refills | Status: AC | PRN

## 2018-11-07 MED ORDER — ACETAMINOPHEN 325 MG PO TABS: 650.0000 mg | ORAL_TABLET | Freq: Four times a day (QID) | ORAL | 0 refills | Status: AC | PRN

## 2018-11-07 MED ORDER — DOCUSATE SODIUM 100 MG PO CAPS: 200.0000 mg | ORAL_CAPSULE | Freq: Every day | ORAL | 0 refills | Status: AC

## 2018-11-07 MED ORDER — POLYETHYLENE GLYCOL 3350 17 G PO PACK: 17.0000 g | PACK | Freq: Every day | ORAL | 0 refills | Status: AC | PRN

## 2018-11-07 NOTE — TOC Transition Note (Signed)
Transition of Care [facility]) - CM/SW Discharge Note   Patient Details  Name: [name] MRN: [id number] Date of Birth: July 06, 1922  Transition of Care [facility] CM/SW Contact:  Lia Hopping, [place] Phone Number: 11/07/2018, 10:37 AM   Clinical Narrative:    Discharge to: [facility] Room 610B [facility] to transport.  Nurse call report: [phone]   Final next level of care: Skilled Nursing Facility Barriers to Discharge: Insurance Authorization   Patient Goals and CMS Choice Patient states their goals for this hospitalization and ongoing recovery are:: to be able to use her legs again CMS Medicare.gov Compare Post Acute Care list provided to:: Patient Choice offered to / list presented to : Patient  Discharge Placement              Patient chooses bed at: [facility] Patient to be transferred to facility by: [facility]   Patient and family notified of of transfer: 11/07/18  Discharge Plan and Services In-house Referral: Clinical Social Work   Post Acute Care Choice: [place]                               Social Determinants of Health (SDOH) Interventions     Readmission Risk Interventions No flowsheet data found.

## 2018-11-07 NOTE — Discharge Summary (Signed)
Physician Discharge Summary  Luvena Wentling TDV:[id number] DOB: [birth year] DOA: 10/31/2018  PCP: Lajean Manes, MD  Admit date: 10/31/2018 Discharge date: 11/07/2018  Admitted From: Assisted living Disposition: Skilled nursing facility  Recommendations for Outpatient Follow-up:  1. Follow up with PCP in 1-2 weeks 2. Please obtain BMP/CBC in one week 3. Follow-up with orthopedics as an outpatient  Discharge Condition: Stable CODE STATUS: DNR Diet recommendation: Heart healthy  Brief/Interim Summary: 85 year old female with a history of hypertension, previous history of breast cancer, anemia, was brought to the emergency room after suffering a mechanical fall resulting in a left hip fracture.  She was seen by orthopedics and underwent operative repair.  Postoperatively, she was continued on Lovenox for DVT prophylaxis.  Pain has been reasonably controlled with Tylenol and tramadol.  She was noted to have a decline in hemoglobin since admission.  It appears that her baseline hemoglobin runs between 9-10.  On presentation, she was noted to have a hemoglobin of 13 which was likely hemoconcentrated.  Currently, hemoglobin is 8.1 which is mildly below her baseline, likely related to surgery.  She should have a repeat CBC in 1 week to ensure stability.  Blood pressures currently stable on amlodipine.  Patient has been accepted skilled nursing facility and will be discharged there today for physical therapy.  Discharge Diagnoses:  Active Problems:   Essential hypertension   Hip fracture (HCC)   Accelerated hypertension   Anemia    Discharge Instructions  Discharge Instructions    Diet - low sodium heart healthy   Complete by: As directed    Increase activity slowly   Complete by: As directed      Allergies as of 11/07/2018      Reactions   Macrobid [nitrofurantoin Macrocrystal]    Caused shortness of breath.   Other Nausea And Vomiting   All seafood      Medication List     STOP taking these medications   aspirin 81 MG chewable tablet Commonly known as: Aspirin Childrens   naproxen sodium 220 MG tablet Commonly known as: ALEVE   oxyCODONE 5 MG immediate release tablet Commonly known as: Oxy IR/ROXICODONE     TAKE these medications   acetaminophen 325 MG tablet Commonly known as: TYLENOL Take 2 tablets (650 mg total) by mouth every 6 (six) hours as needed for mild pain.   amLODipine 2.5 MG tablet Commonly known as: NORVASC Take 2.5 mg by mouth daily.   Cranberry 500 MG Caps Take by mouth.   docusate sodium 100 MG capsule Commonly known as: COLACE Take 2 capsules (200 mg total) by mouth daily.   enoxaparin 30 MG/0.3ML injection Commonly known as: LOVENOX Inject 0.3 mLs (30 mg total) into the skin daily.   furosemide 40 MG tablet Commonly known as: LASIX Take 40 mg by mouth Daily.   meclizine 25 MG tablet Commonly known as: ANTIVERT Take 25 mg by mouth daily.   methocarbamol 500 MG tablet Commonly known as: ROBAXIN Take 1 tablet (500 mg total) by mouth every 8 (eight) hours as needed for muscle spasms.   multivitamin with minerals Tabs tablet Take 1 tablet by mouth daily.   polyethylene glycol 17 g packet Commonly known as: MIRALAX / GLYCOLAX Take 17 g by mouth daily as needed for moderate constipation.   polyvinyl alcohol 1.4 % ophthalmic solution Commonly known as: LIQUIFILM TEARS Place 1 drop into both eyes as needed for dry eyes.   traMADol 50 MG tablet Commonly known as: ULTRAM Take 1  tablet (50 mg total) by mouth every 6 (six) hours as needed for severe pain.       Contact information for follow-up providers    Swinteck, Aaron Edelman, MD. Schedule an appointment as soon as possible for a visit in 2 weeks.   Specialty: Orthopedic Surgery Why: For wound re-check Contact information: [address] [place] [address] [phone]            Contact information for after-discharge care    Destination     [facility] .   Service: Skilled Nursing Contact information: [address] [place] [phone]                 Allergies  Allergen Reactions  . Macrobid [Nitrofurantoin Macrocrystal]     Caused shortness of breath.  . Other Nausea And Vomiting    All seafood    Consultations:  Orthopedics   Procedures/Studies: Dg Chest Port 1 View  Result Date: 10/31/2018 CLINICAL DATA:  Fall this morning. Left hip pain. Some shortness of breath. EXAM: PORTABLE CHEST 1 VIEW COMPARISON:  07/09/2018. FINDINGS: Cardiac silhouette normal in size. No mediastinal or hilar masses or evidence of adenopathy. Clear lungs.  No pleural effusion or pneumothorax. Skeletal structures are grossly intact. IMPRESSION: No active disease. Electronically Signed   By: Lajean Manes M.D.   On: 10/31/2018 13:11   Dg C-arm 1-60 Min-no Report  Result Date: 10/31/2018 Fluoroscopy was utilized by the requesting physician.  No radiographic interpretation.   Dg Hip Unilat With Pelvis 2-3 Views Left  Result Date: 10/31/2018 CLINICAL DATA:  Left hip pain after trip and fall this morning. Initial encounter. EXAM: DG HIP (WITH OR WITHOUT PELVIS) 2-3V LEFT COMPARISON:  Plain films right hip 07/09/2018. FINDINGS: The patient has an acute left intertrochanteric fracture. Hip screw and short intramedullary nail fix the right intertrochanteric fracture seen on the prior examination. The fracture appears to be a partial union. The exam is otherwise negative. IMPRESSION: Acute left intertrochanteric fracture. Status post fixation of a right intertrochanteric fracture which appears to be a partial union. Electronically Signed   By: Inge Rise M.D.   On: 10/31/2018 13:10   Dg Femur 1v Left  Result Date: 10/31/2018 CLINICAL DATA:  IM nail left hip EXAM: LEFT FEMUR 1 VIEW COMPARISON:  10/31/2018 FINDINGS: Two low resolution intraoperative spot views of the left hip. Total  fluoroscopy time was 1 minutes 10 seconds. There is intramedullary rod and screw fixation of the left femur for comminuted intertrochanteric fracture. IMPRESSION: Intraoperative fluoroscopic assistance provided during surgical fixation of proximal left femoral fracture Electronically Signed   By: Donavan Foil M.D.   On: 10/31/2018 20:51      Subjective: Reports that pain is reasonably controlled.  Denies any dizziness or lightheadedness on standing.  Has not had any shortness of breath.  Has not noticed any significant bleeding anywhere.  Discharge Exam: Vitals:   11/06/18 1000 11/06/18 1356 11/06/18 2129 11/07/18 0610  BP: (!) 127/91 (!) 149/62 (!) 156/59 (!) 152/56  Pulse: 86 84 84 81  Resp: (!) 24 20 18 18   Temp:  97.8 F (36.6 C) 98.3 F (36.8 C) 98 F (36.7 C)  TempSrc:  Oral Oral Oral  SpO2: 100% 99% 98% 96%  Weight:      Height:        General: Pt is alert, awake, not in acute distress Cardiovascular: RRR, S1/S2 +, no rubs, no gallops Respiratory: CTA bilaterally, no wheezing,  no rhonchi Abdominal: Soft, NT, ND, bowel sounds + Extremities: No significant bruising appreciated on left leg.    The results of significant diagnostics from this hospitalization (including imaging, microbiology, ancillary and laboratory) are listed below for reference.     Microbiology: Recent Results (from the past 240 hour(s))  SARS Coronavirus 2 by RT PCR (hospital order, performed in [facility] hospital lab) Nasopharyngeal Nasopharyngeal Swab     Status: None   Collection Time: 10/31/18  3:41 PM   Specimen: Nasopharyngeal Swab  Result Value Ref Range Status   SARS Coronavirus 2 NEGATIVE NEGATIVE Final    Comment: (NOTE) If result is NEGATIVE SARS-CoV-2 target nucleic acids are NOT DETECTED. The SARS-CoV-2 RNA is generally detectable in upper and lower  respiratory specimens during the acute phase of infection. The lowest  concentration of SARS-CoV-2 viral copies this assay can  detect is 250  copies / mL. A negative result does not preclude SARS-CoV-2 infection  and should not be used as the sole basis for treatment or other  patient management decisions.  A negative result may occur with  improper specimen collection / handling, submission of specimen other  than nasopharyngeal swab, presence of viral mutation(s) within the  areas targeted by this assay, and inadequate number of viral copies  (<250 copies / mL). A negative result must be combined with clinical  observations, patient history, and epidemiological information. If result is POSITIVE SARS-CoV-2 target nucleic acids are DETECTED. The SARS-CoV-2 RNA is generally detectable in upper and lower  respiratory specimens dur ing the acute phase of infection.  Positive  results are indicative of active infection with SARS-CoV-2.  Clinical  correlation with patient history and other diagnostic information is  necessary to determine patient infection status.  Positive results do  not rule out bacterial infection or co-infection with other viruses. If result is PRESUMPTIVE POSTIVE SARS-CoV-2 nucleic acids MAY BE PRESENT.   A presumptive positive result was obtained on the submitted specimen  and confirmed on repeat testing.  While 2019 novel coronavirus  (SARS-CoV-2) nucleic acids may be present in the submitted sample  additional confirmatory testing may be necessary for epidemiological  and / or clinical management purposes  to differentiate between  SARS-CoV-2 and other Sarbecovirus currently known to infect humans.  If clinically indicated additional testing with an alternate test  methodology [phone]) is advised. The SARS-CoV-2 RNA is generally  detectable in upper and lower respiratory sp ecimens during the acute  phase of infection. The expected result is Negative. Fact Sheet for Patients:  StrictlyIdeas.no Fact Sheet for Healthcare  Providers: BankingDealers.co.za This test is not yet approved or cleared by the [place] FDA and has been authorized for detection and/or diagnosis of SARS-CoV-2 by FDA under an Emergency Use Authorization (EUA).  This EUA will remain in effect (meaning this test can be used) for the duration of the COVID-19 declaration under Section 564(b)(1) of the Act, 21 U.S.C. section 360bbb-3(b)(1), unless the authorization is terminated or revoked sooner. Performed at [facility], [place] [address], [place], [place] [address]   Surgical pcr screen     Status: None   Collection Time: 10/31/18  6:49 PM   Specimen: Nasal Mucosa; Nasal Swab  Result Value Ref Range Status   MRSA, PCR NEGATIVE NEGATIVE Final   Staphylococcus aureus NEGATIVE NEGATIVE Final    Comment: (NOTE) The Xpert SA Assay (FDA approved for NASAL specimens in patients 34 years of age and older), is one component of a  comprehensive surveillance program. It is not intended to diagnose infection nor to guide or monitor treatment. Performed at [facility], [place] [address], [place], [place] [address]   SARS CORONAVIRUS 2 (TAT 6-24 HRS) Nasopharyngeal Nasopharyngeal Swab     Status: None   Collection Time: 11/02/18 11:24 AM   Specimen: Nasopharyngeal Swab  Result Value Ref Range Status   SARS Coronavirus 2 NEGATIVE NEGATIVE Final    Comment: (NOTE) SARS-CoV-2 target nucleic acids are NOT DETECTED. The SARS-CoV-2 RNA is generally detectable in upper and lower respiratory specimens during the acute phase of infection. Negative results do not preclude SARS-CoV-2 infection, do not rule out co-infections with other pathogens, and should not be used as the sole basis for treatment or other patient management decisions. Negative results must be combined with clinical observations, patient history, and epidemiological information. The expected result is Negative. Fact Sheet  for Patients: SugarRoll.be Fact Sheet for Healthcare Providers: https://www.woods-mathews.com/ This test is not yet approved or cleared by the [place] FDA and  has been authorized for detection and/or diagnosis of SARS-CoV-2 by FDA under an Emergency Use Authorization (EUA). This EUA will remain  in effect (meaning this test can be used) for the duration of the COVID-19 declaration under Section 56 4(b)(1) of the Act, 21 U.S.C. section 360bbb-3(b)(1), unless the authorization is terminated or revoked sooner. Performed at [facility], [place] [address], [place], [place] [address]   SARS CORONAVIRUS 2 (TAT 6-24 HRS) Nasopharyngeal Nasopharyngeal Swab     Status: None   Collection Time: 11/06/18  9:57 AM   Specimen: Nasopharyngeal Swab  Result Value Ref Range Status   SARS Coronavirus 2 NEGATIVE NEGATIVE Final    Comment: (NOTE) SARS-CoV-2 target nucleic acids are NOT DETECTED. The SARS-CoV-2 RNA is generally detectable in upper and lower respiratory specimens during the acute phase of infection. Negative results do not preclude SARS-CoV-2 infection, do not rule out co-infections with other pathogens, and should not be used as the sole basis for treatment or other patient management decisions. Negative results must be combined with clinical observations, patient history, and epidemiological information. The expected result is Negative. Fact Sheet for Patients: SugarRoll.be Fact Sheet for Healthcare Providers: https://www.woods-mathews.com/ This test is not yet approved or cleared by the [place] FDA and  has been authorized for detection and/or diagnosis of SARS-CoV-2 by FDA under an Emergency Use Authorization (EUA). This EUA will remain  in effect (meaning this test can be used) for the duration of the COVID-19 declaration under Section 56 4(b)(1) of the Act, 21 U.S.C. section  360bbb-3(b)(1), unless the authorization is terminated or revoked sooner. Performed at [facility], [place] [address], [place], [place] [address]      Labs: BNP (last 3 results) No results for input(s): BNP in the last 8760 hours. Basic Metabolic Panel: Recent Labs  Lab 10/31/18 1200 11/01/18 0332  NA 138 135  K 4.7 5.1  CL 103 105  CO2 24 21*  GLUCOSE 122* 159*  BUN 22 19  CREATININE 0.67 0.63  CALCIUM 8.9 8.4*  MG 2.2  --    Liver Function Tests: Recent Labs  Lab 11/01/18 0332  AST 22  ALT 17  ALKPHOS 52  BILITOT 0.5  PROT 5.6*  ALBUMIN 3.2*   No results for input(s): LIPASE, AMYLASE in the last 168 hours. No results for input(s): AMMONIA in the last 168 hours. CBC: Recent Labs  Lab 10/31/18 1200 11/01/18 0332 11/02/18 0251  WBC  7.9 13.1* 8.3  NEUTROABS 5.0  --  5.1  HGB 13.0 10.1* 8.1*  HCT 40.8 31.8* 26.0*  MCV 101.0* 102.6* 102.8*  PLT 339 243 218   Cardiac Enzymes: No results for input(s): CKTOTAL, CKMB, CKMBINDEX, TROPONINI in the last 168 hours. BNP: Invalid input(s): POCBNP CBG: No results for input(s): GLUCAP in the last 168 hours. D-Dimer No results for input(s): DDIMER in the last 72 hours. Hgb A1c No results for input(s): HGBA1C in the last 72 hours. Lipid Profile No results for input(s): CHOL, HDL, LDLCALC, TRIG, CHOLHDL, LDLDIRECT in the last 72 hours. Thyroid function studies No results for input(s): TSH, T4TOTAL, T3FREE, THYROIDAB in the last 72 hours.  Invalid input(s): FREET3 Anemia work up No results for input(s): VITAMINB12, FOLATE, FERRITIN, TIBC, IRON, RETICCTPCT in the last 72 hours. Urinalysis    Component Value Date/Time   COLORURINE YELLOW 06/15/2011 1628   APPEARANCEUR HAZY (A) 06/15/2011 1628   LABSPEC 1.003 (L) 06/15/2011 1628   PHURINE 7.5 06/15/2011 1628   GLUCOSEU NEGATIVE 06/15/2011 1628   HGBUR TRACE (A) 06/15/2011 1628   BILIRUBINUR NEGATIVE 06/15/2011 1628   KETONESUR NEGATIVE 06/15/2011 1628    PROTEINUR NEGATIVE 06/15/2011 1628   UROBILINOGEN 0.2 06/15/2011 1628   NITRITE NEGATIVE 06/15/2011 1628   LEUKOCYTESUR MODERATE (A) 06/15/2011 1628   Sepsis Labs Invalid input(s): PROCALCITONIN,  WBC,  LACTICIDVEN Microbiology Recent Results (from the past 240 hour(s))  SARS Coronavirus 2 by RT PCR (hospital order, performed in [facility] hospital lab) Nasopharyngeal Nasopharyngeal Swab     Status: None   Collection Time: 10/31/18  3:41 PM   Specimen: Nasopharyngeal Swab  Result Value Ref Range Status   SARS Coronavirus 2 NEGATIVE NEGATIVE Final    Comment: (NOTE) If result is NEGATIVE SARS-CoV-2 target nucleic acids are NOT DETECTED. The SARS-CoV-2 RNA is generally detectable in upper and lower  respiratory specimens during the acute phase of infection. The lowest  concentration of SARS-CoV-2 viral copies this assay can detect is 250  copies / mL. A negative result does not preclude SARS-CoV-2 infection  and should not be used as the sole basis for treatment or other  patient management decisions.  A negative result may occur with  improper specimen collection / handling, submission of specimen other  than nasopharyngeal swab, presence of viral mutation(s) within the  areas targeted by this assay, and inadequate number of viral copies  (<250 copies / mL). A negative result must be combined with clinical  observations, patient history, and epidemiological information. If result is POSITIVE SARS-CoV-2 target nucleic acids are DETECTED. The SARS-CoV-2 RNA is generally detectable in upper and lower  respiratory specimens dur ing the acute phase of infection.  Positive  results are indicative of active infection with SARS-CoV-2.  Clinical  correlation with patient history and other diagnostic information is  necessary to determine patient infection status.  Positive results do  not rule out bacterial infection or co-infection with other viruses. If result is PRESUMPTIVE  POSTIVE SARS-CoV-2 nucleic acids MAY BE PRESENT.   A presumptive positive result was obtained on the submitted specimen  and confirmed on repeat testing.  While 2019 novel coronavirus  (SARS-CoV-2) nucleic acids may be present in the submitted sample  additional confirmatory testing may be necessary for epidemiological  and / or clinical management purposes  to differentiate between  SARS-CoV-2 and other Sarbecovirus currently known to infect humans.  If clinically indicated additional testing with an alternate test  methodology [phone]) is advised. The SARS-CoV-2  RNA is generally  detectable in upper and lower respiratory sp ecimens during the acute  phase of infection. The expected result is Negative. Fact Sheet for Patients:  StrictlyIdeas.no Fact Sheet for Healthcare Providers: BankingDealers.co.za This test is not yet approved or cleared by the [place] FDA and has been authorized for detection and/or diagnosis of SARS-CoV-2 by FDA under an Emergency Use Authorization (EUA).  This EUA will remain in effect (meaning this test can be used) for the duration of the COVID-19 declaration under Section 564(b)(1) of the Act, 21 U.S.C. section 360bbb-3(b)(1), unless the authorization is terminated or revoked sooner. Performed at [facility], [place] [address] [place], [place] [address]   Surgical pcr screen     Status: None   Collection Time: 10/31/18  6:49 PM   Specimen: Nasal Mucosa; Nasal Swab  Result Value Ref Range Status   MRSA, PCR NEGATIVE NEGATIVE Final   Staphylococcus aureus NEGATIVE NEGATIVE Final    Comment: (NOTE) The Xpert SA Assay (FDA approved for NASAL specimens in patients 73 years of age and older), is one component of a comprehensive surveillance program. It is not intended to diagnose infection nor to guide or monitor treatment. Performed at [facility], [place] [address], [place], [place] [address]   SARS CORONAVIRUS 2 (TAT 6-24 HRS) Nasopharyngeal Nasopharyngeal Swab     Status: None   Collection Time: 11/02/18 11:24 AM   Specimen: Nasopharyngeal Swab  Result Value Ref Range Status   SARS Coronavirus 2 NEGATIVE NEGATIVE Final    Comment: (NOTE) SARS-CoV-2 target nucleic acids are NOT DETECTED. The SARS-CoV-2 RNA is generally detectable in upper and lower respiratory specimens during the acute phase of infection. Negative results do not preclude SARS-CoV-2 infection, do not rule out co-infections with other pathogens, and should not be used as the sole basis for treatment or other patient management decisions. Negative results must be combined with clinical observations, patient history, and epidemiological information. The expected result is Negative. Fact Sheet for Patients: SugarRoll.be Fact Sheet for Healthcare Providers: https://www.woods-mathews.com/ This test is not yet approved or cleared by the [place] FDA and  has been authorized for detection and/or diagnosis of SARS-CoV-2 by FDA under an Emergency Use Authorization (EUA). This EUA will remain  in effect (meaning this test can be used) for the duration of the COVID-19 declaration under Section 56 4(b)(1) of the Act, 21 U.S.C. section 360bbb-3(b)(1), unless the authorization is terminated or revoked sooner. Performed at [facility], [place] [address], [place], [place] [address]   SARS CORONAVIRUS 2 (TAT 6-24 HRS) Nasopharyngeal Nasopharyngeal Swab     Status: None   Collection Time: 11/06/18  9:57 AM   Specimen: Nasopharyngeal Swab  Result Value Ref Range Status   SARS Coronavirus 2 NEGATIVE NEGATIVE Final    Comment: (NOTE) SARS-CoV-2 target nucleic acids are NOT DETECTED. The SARS-CoV-2 RNA is generally detectable in upper and lower respiratory specimens during the acute phase of infection. Negative results do not preclude  SARS-CoV-2 infection, do not rule out co-infections with other pathogens, and should not be used as the sole basis for treatment or other patient management decisions. Negative results must be combined with clinical observations, patient history, and epidemiological information. The expected result is Negative. Fact Sheet for Patients: SugarRoll.be Fact Sheet for Healthcare Providers: https://www.woods-mathews.com/ This test is not yet approved or cleared by the [place] FDA and  has been authorized for detection and/or diagnosis of SARS-CoV-2 by FDA under  an Emergency Use Authorization (EUA). This EUA will remain  in effect (meaning this test can be used) for the duration of the COVID-19 declaration under Section 56 4(b)(1) of the Act, 21 U.S.C. section 360bbb-3(b)(1), unless the authorization is terminated or revoked sooner. Performed at [facility], [place] [address], [place], [place] [address]      Time coordinating discharge: 50mins  SIGNED:   Kathie Dike, MD  [facility] 11/07/2018, 9:55 AM   If 7PM-7AM, please contact night-coverage [email or web address]

## 2018-11-07 NOTE — Plan of Care (Signed)
  Problem: Clinical Measurements: Goal: Respiratory complications will improve Outcome: Progressing Goal: Cardiovascular complication will be avoided Outcome: Progressing   Problem: Elimination: Goal: Will not experience complications related to bowel motility Outcome: Progressing Goal: Will not experience complications related to urinary retention Outcome: Progressing   Problem: Safety: Goal: Ability to remain free from injury will improve Outcome: Progressing

## 2018-11-08 DIAGNOSIS — R509 Fever, unspecified: Secondary | ICD-10-CM | POA: Diagnosis not present

## 2018-11-12 DIAGNOSIS — Z7982 Long term (current) use of aspirin: Secondary | ICD-10-CM | POA: Diagnosis not present

## 2018-11-12 DIAGNOSIS — E559 Vitamin D deficiency, unspecified: Secondary | ICD-10-CM | POA: Diagnosis not present

## 2018-11-12 DIAGNOSIS — G4700 Insomnia, unspecified: Secondary | ICD-10-CM | POA: Diagnosis not present

## 2018-11-12 DIAGNOSIS — M9701XA Periprosthetic fracture around internal prosthetic right hip joint, initial encounter: Secondary | ICD-10-CM | POA: Diagnosis not present

## 2018-11-12 DIAGNOSIS — M79622 Pain in left upper arm: Secondary | ICD-10-CM | POA: Diagnosis not present

## 2018-11-12 DIAGNOSIS — M79602 Pain in left arm: Secondary | ICD-10-CM | POA: Diagnosis not present

## 2018-11-12 DIAGNOSIS — R609 Edema, unspecified: Secondary | ICD-10-CM | POA: Diagnosis not present

## 2018-11-13 DIAGNOSIS — M80052D Age-related osteoporosis with current pathological fracture, left femur, subsequent encounter for fracture with routine healing: Secondary | ICD-10-CM | POA: Diagnosis not present

## 2018-11-13 DIAGNOSIS — I10 Essential (primary) hypertension: Secondary | ICD-10-CM | POA: Diagnosis not present

## 2018-11-13 DIAGNOSIS — H04123 Dry eye syndrome of bilateral lacrimal glands: Secondary | ICD-10-CM | POA: Diagnosis not present

## 2018-11-13 DIAGNOSIS — Z7409 Other reduced mobility: Secondary | ICD-10-CM | POA: Diagnosis not present

## 2018-11-13 DIAGNOSIS — G8911 Acute pain due to trauma: Secondary | ICD-10-CM | POA: Diagnosis not present

## 2018-11-13 DIAGNOSIS — K5900 Constipation, unspecified: Secondary | ICD-10-CM | POA: Diagnosis not present

## 2018-11-14 DIAGNOSIS — M79602 Pain in left arm: Secondary | ICD-10-CM | POA: Diagnosis not present

## 2018-11-14 DIAGNOSIS — Z7982 Long term (current) use of aspirin: Secondary | ICD-10-CM | POA: Diagnosis not present

## 2018-11-14 DIAGNOSIS — M6281 Muscle weakness (generalized): Secondary | ICD-10-CM | POA: Diagnosis not present

## 2018-11-14 DIAGNOSIS — R609 Edema, unspecified: Secondary | ICD-10-CM | POA: Diagnosis not present

## 2018-11-14 DIAGNOSIS — N390 Urinary tract infection, site not specified: Secondary | ICD-10-CM | POA: Diagnosis not present

## 2018-11-14 DIAGNOSIS — I10 Essential (primary) hypertension: Secondary | ICD-10-CM | POA: Diagnosis not present

## 2018-11-14 DIAGNOSIS — G4700 Insomnia, unspecified: Secondary | ICD-10-CM | POA: Diagnosis not present

## 2018-11-14 DIAGNOSIS — E559 Vitamin D deficiency, unspecified: Secondary | ICD-10-CM | POA: Diagnosis not present

## 2018-11-14 DIAGNOSIS — D649 Anemia, unspecified: Secondary | ICD-10-CM | POA: Diagnosis not present

## 2018-11-14 DIAGNOSIS — G8929 Other chronic pain: Secondary | ICD-10-CM | POA: Diagnosis not present

## 2018-11-14 DIAGNOSIS — M9701XA Periprosthetic fracture around internal prosthetic right hip joint, initial encounter: Secondary | ICD-10-CM | POA: Diagnosis not present

## 2018-11-17 DIAGNOSIS — G8911 Acute pain due to trauma: Secondary | ICD-10-CM | POA: Diagnosis not present

## 2018-11-17 DIAGNOSIS — M79602 Pain in left arm: Secondary | ICD-10-CM | POA: Diagnosis not present

## 2018-11-17 DIAGNOSIS — Z7409 Other reduced mobility: Secondary | ICD-10-CM | POA: Diagnosis not present

## 2018-11-17 DIAGNOSIS — M80052D Age-related osteoporosis with current pathological fracture, left femur, subsequent encounter for fracture with routine healing: Secondary | ICD-10-CM | POA: Diagnosis not present

## 2018-11-17 DIAGNOSIS — K5900 Constipation, unspecified: Secondary | ICD-10-CM | POA: Diagnosis not present

## 2018-11-17 DIAGNOSIS — H04123 Dry eye syndrome of bilateral lacrimal glands: Secondary | ICD-10-CM | POA: Diagnosis not present

## 2018-11-17 DIAGNOSIS — E559 Vitamin D deficiency, unspecified: Secondary | ICD-10-CM | POA: Diagnosis not present

## 2018-11-17 DIAGNOSIS — R609 Edema, unspecified: Secondary | ICD-10-CM | POA: Diagnosis not present

## 2018-11-17 DIAGNOSIS — I10 Essential (primary) hypertension: Secondary | ICD-10-CM | POA: Diagnosis not present

## 2018-11-25 DIAGNOSIS — I10 Essential (primary) hypertension: Secondary | ICD-10-CM | POA: Diagnosis not present

## 2018-11-25 DIAGNOSIS — G4700 Insomnia, unspecified: Secondary | ICD-10-CM | POA: Diagnosis not present

## 2018-11-25 DIAGNOSIS — Z7409 Other reduced mobility: Secondary | ICD-10-CM | POA: Diagnosis not present

## 2018-11-25 DIAGNOSIS — G8911 Acute pain due to trauma: Secondary | ICD-10-CM | POA: Diagnosis not present

## 2018-11-25 DIAGNOSIS — H04123 Dry eye syndrome of bilateral lacrimal glands: Secondary | ICD-10-CM | POA: Diagnosis not present

## 2018-11-25 DIAGNOSIS — K5900 Constipation, unspecified: Secondary | ICD-10-CM | POA: Diagnosis not present

## 2018-11-25 DIAGNOSIS — E559 Vitamin D deficiency, unspecified: Secondary | ICD-10-CM | POA: Diagnosis not present

## 2018-11-25 DIAGNOSIS — M79602 Pain in left arm: Secondary | ICD-10-CM | POA: Diagnosis not present

## 2018-11-25 DIAGNOSIS — M80052D Age-related osteoporosis with current pathological fracture, left femur, subsequent encounter for fracture with routine healing: Secondary | ICD-10-CM | POA: Diagnosis not present

## 2018-12-01 DIAGNOSIS — S72141D Displaced intertrochanteric fracture of right femur, subsequent encounter for closed fracture with routine healing: Secondary | ICD-10-CM | POA: Diagnosis not present

## 2018-12-01 DIAGNOSIS — M25571 Pain in right ankle and joints of right foot: Secondary | ICD-10-CM | POA: Diagnosis not present

## 2018-12-01 DIAGNOSIS — S72142D Displaced intertrochanteric fracture of left femur, subsequent encounter for closed fracture with routine healing: Secondary | ICD-10-CM | POA: Diagnosis not present

## 2018-12-02 DIAGNOSIS — I10 Essential (primary) hypertension: Secondary | ICD-10-CM | POA: Diagnosis not present

## 2018-12-02 DIAGNOSIS — M25571 Pain in right ankle and joints of right foot: Secondary | ICD-10-CM | POA: Diagnosis not present

## 2018-12-02 DIAGNOSIS — M6281 Muscle weakness (generalized): Secondary | ICD-10-CM | POA: Diagnosis not present

## 2018-12-02 DIAGNOSIS — M80052D Age-related osteoporosis with current pathological fracture, left femur, subsequent encounter for fracture with routine healing: Secondary | ICD-10-CM | POA: Diagnosis not present

## 2018-12-02 DIAGNOSIS — Z7409 Other reduced mobility: Secondary | ICD-10-CM | POA: Diagnosis not present

## 2018-12-12 DIAGNOSIS — M80052D Age-related osteoporosis with current pathological fracture, left femur, subsequent encounter for fracture with routine healing: Secondary | ICD-10-CM | POA: Diagnosis not present

## 2018-12-12 DIAGNOSIS — M6281 Muscle weakness (generalized): Secondary | ICD-10-CM | POA: Diagnosis not present

## 2018-12-12 DIAGNOSIS — K5900 Constipation, unspecified: Secondary | ICD-10-CM | POA: Diagnosis not present

## 2018-12-12 DIAGNOSIS — M25571 Pain in right ankle and joints of right foot: Secondary | ICD-10-CM | POA: Diagnosis not present

## 2018-12-12 DIAGNOSIS — Z7409 Other reduced mobility: Secondary | ICD-10-CM | POA: Diagnosis not present

## 2018-12-12 DIAGNOSIS — G8911 Acute pain due to trauma: Secondary | ICD-10-CM | POA: Diagnosis not present

## 2018-12-12 DIAGNOSIS — M79602 Pain in left arm: Secondary | ICD-10-CM | POA: Diagnosis not present

## 2018-12-12 DIAGNOSIS — I10 Essential (primary) hypertension: Secondary | ICD-10-CM | POA: Diagnosis not present

## 2018-12-12 DIAGNOSIS — H04123 Dry eye syndrome of bilateral lacrimal glands: Secondary | ICD-10-CM | POA: Diagnosis not present

## 2018-12-19 DIAGNOSIS — M6281 Muscle weakness (generalized): Secondary | ICD-10-CM | POA: Diagnosis not present

## 2018-12-19 DIAGNOSIS — M80052D Age-related osteoporosis with current pathological fracture, left femur, subsequent encounter for fracture with routine healing: Secondary | ICD-10-CM | POA: Diagnosis not present

## 2018-12-19 DIAGNOSIS — Z7409 Other reduced mobility: Secondary | ICD-10-CM | POA: Diagnosis not present

## 2018-12-19 DIAGNOSIS — I10 Essential (primary) hypertension: Secondary | ICD-10-CM | POA: Diagnosis not present

## 2018-12-19 DIAGNOSIS — M79602 Pain in left arm: Secondary | ICD-10-CM | POA: Diagnosis not present

## 2018-12-19 DIAGNOSIS — K5900 Constipation, unspecified: Secondary | ICD-10-CM | POA: Diagnosis not present

## 2018-12-19 DIAGNOSIS — M25571 Pain in right ankle and joints of right foot: Secondary | ICD-10-CM | POA: Diagnosis not present

## 2018-12-19 DIAGNOSIS — H04123 Dry eye syndrome of bilateral lacrimal glands: Secondary | ICD-10-CM | POA: Diagnosis not present

## 2018-12-19 DIAGNOSIS — E559 Vitamin D deficiency, unspecified: Secondary | ICD-10-CM | POA: Diagnosis not present

## 2018-12-23 DIAGNOSIS — R2689 Other abnormalities of gait and mobility: Secondary | ICD-10-CM | POA: Diagnosis not present

## 2018-12-23 DIAGNOSIS — M25551 Pain in right hip: Secondary | ICD-10-CM | POA: Diagnosis not present

## 2018-12-23 DIAGNOSIS — S72002D Fracture of unspecified part of neck of left femur, subsequent encounter for closed fracture with routine healing: Secondary | ICD-10-CM | POA: Diagnosis not present

## 2018-12-23 DIAGNOSIS — M25552 Pain in left hip: Secondary | ICD-10-CM | POA: Diagnosis not present

## 2018-12-24 ENCOUNTER — Other Ambulatory Visit: Payer: Self-pay

## 2018-12-24 NOTE — Patient Outreach (Signed)
[facility]) Care Management  12/24/2018  Alizeh Madril [birth year] [id number]   Referral Date: 12/24/2018 Referral Source: [employer] Report Referral Reason: Discharge from [facility] 12/21/2018   Outreach Attempt: spoke with patient. She states she is doing ok.  She and her spouse reside at [facility] in the [place] area. She states they provide all care and assistance along with therapy.  Patient reports seeing surgeon on yesterday and was told that her hip was healing well. Patient denies any medication questions and states she gets all her medications.    Conditions: Patient with past medical history of HTN and breast cancer. Patient sustained a fall that resulted in left hip fracture. Patient uses walker for ambulation   Discussed [facility] services and support. Patient declined nurse follow up or further services as [facility] provides all care.      Plan: RN CM will close case.     Jone Baseman, RN, MSN [facility] Care Management Care Management Coordinator Direct Line [phone] Toll Free: [phone]  Fax: [phone]

## 2018-12-25 DIAGNOSIS — R2689 Other abnormalities of gait and mobility: Secondary | ICD-10-CM | POA: Diagnosis not present

## 2018-12-25 DIAGNOSIS — S72002D Fracture of unspecified part of neck of left femur, subsequent encounter for closed fracture with routine healing: Secondary | ICD-10-CM | POA: Diagnosis not present

## 2018-12-30 DIAGNOSIS — R2689 Other abnormalities of gait and mobility: Secondary | ICD-10-CM | POA: Diagnosis not present

## 2018-12-30 DIAGNOSIS — S72002D Fracture of unspecified part of neck of left femur, subsequent encounter for closed fracture with routine healing: Secondary | ICD-10-CM | POA: Diagnosis not present

## 2019-01-01 DIAGNOSIS — R2689 Other abnormalities of gait and mobility: Secondary | ICD-10-CM | POA: Diagnosis not present

## 2019-01-01 DIAGNOSIS — S72002D Fracture of unspecified part of neck of left femur, subsequent encounter for closed fracture with routine healing: Secondary | ICD-10-CM | POA: Diagnosis not present

## 2019-01-19 DIAGNOSIS — S72002D Fracture of unspecified part of neck of left femur, subsequent encounter for closed fracture with routine healing: Secondary | ICD-10-CM | POA: Diagnosis not present

## 2019-01-19 DIAGNOSIS — R2689 Other abnormalities of gait and mobility: Secondary | ICD-10-CM | POA: Diagnosis not present

## 2019-01-21 DIAGNOSIS — S72002D Fracture of unspecified part of neck of left femur, subsequent encounter for closed fracture with routine healing: Secondary | ICD-10-CM | POA: Diagnosis not present

## 2019-01-21 DIAGNOSIS — R2689 Other abnormalities of gait and mobility: Secondary | ICD-10-CM | POA: Diagnosis not present

## 2019-01-26 DIAGNOSIS — S72002D Fracture of unspecified part of neck of left femur, subsequent encounter for closed fracture with routine healing: Secondary | ICD-10-CM | POA: Diagnosis not present

## 2019-01-26 DIAGNOSIS — R2689 Other abnormalities of gait and mobility: Secondary | ICD-10-CM | POA: Diagnosis not present

## 2019-01-27 DIAGNOSIS — S72141D Displaced intertrochanteric fracture of right femur, subsequent encounter for closed fracture with routine healing: Secondary | ICD-10-CM | POA: Diagnosis not present

## 2019-01-27 DIAGNOSIS — S72142D Displaced intertrochanteric fracture of left femur, subsequent encounter for closed fracture with routine healing: Secondary | ICD-10-CM | POA: Diagnosis not present

## 2019-01-28 ENCOUNTER — Ambulatory Visit: Payer: Medicare HMO | Admitting: Podiatry

## 2019-01-28 DIAGNOSIS — S72002D Fracture of unspecified part of neck of left femur, subsequent encounter for closed fracture with routine healing: Secondary | ICD-10-CM | POA: Diagnosis not present

## 2019-01-28 DIAGNOSIS — R2689 Other abnormalities of gait and mobility: Secondary | ICD-10-CM | POA: Diagnosis not present

## 2019-01-29 DIAGNOSIS — I10 Essential (primary) hypertension: Secondary | ICD-10-CM | POA: Diagnosis not present

## 2019-01-29 DIAGNOSIS — T148XXA Other injury of unspecified body region, initial encounter: Secondary | ICD-10-CM | POA: Diagnosis not present

## 2019-01-29 DIAGNOSIS — M25512 Pain in left shoulder: Secondary | ICD-10-CM | POA: Diagnosis not present

## 2019-01-30 DIAGNOSIS — M25512 Pain in left shoulder: Secondary | ICD-10-CM | POA: Diagnosis not present

## 2019-01-30 DIAGNOSIS — M7542 Impingement syndrome of left shoulder: Secondary | ICD-10-CM | POA: Diagnosis not present

## 2019-02-02 DIAGNOSIS — S72002D Fracture of unspecified part of neck of left femur, subsequent encounter for closed fracture with routine healing: Secondary | ICD-10-CM | POA: Diagnosis not present

## 2019-02-02 DIAGNOSIS — R2689 Other abnormalities of gait and mobility: Secondary | ICD-10-CM | POA: Diagnosis not present

## 2019-02-04 DIAGNOSIS — S72002D Fracture of unspecified part of neck of left femur, subsequent encounter for closed fracture with routine healing: Secondary | ICD-10-CM | POA: Diagnosis not present

## 2019-02-04 DIAGNOSIS — R2689 Other abnormalities of gait and mobility: Secondary | ICD-10-CM | POA: Diagnosis not present

## 2019-02-09 DIAGNOSIS — R2689 Other abnormalities of gait and mobility: Secondary | ICD-10-CM | POA: Diagnosis not present

## 2019-02-09 DIAGNOSIS — S72002D Fracture of unspecified part of neck of left femur, subsequent encounter for closed fracture with routine healing: Secondary | ICD-10-CM | POA: Diagnosis not present

## 2019-02-13 DIAGNOSIS — S8012XA Contusion of left lower leg, initial encounter: Secondary | ICD-10-CM | POA: Diagnosis not present

## 2019-02-13 DIAGNOSIS — R2689 Other abnormalities of gait and mobility: Secondary | ICD-10-CM | POA: Diagnosis not present

## 2019-02-13 DIAGNOSIS — Z7189 Other specified counseling: Secondary | ICD-10-CM | POA: Diagnosis not present

## 2019-02-13 DIAGNOSIS — L03116 Cellulitis of left lower limb: Secondary | ICD-10-CM | POA: Diagnosis not present

## 2019-02-13 DIAGNOSIS — S72002D Fracture of unspecified part of neck of left femur, subsequent encounter for closed fracture with routine healing: Secondary | ICD-10-CM | POA: Diagnosis not present

## 2019-02-13 DIAGNOSIS — I10 Essential (primary) hypertension: Secondary | ICD-10-CM | POA: Diagnosis not present

## 2019-02-19 DIAGNOSIS — S81802D Unspecified open wound, left lower leg, subsequent encounter: Secondary | ICD-10-CM | POA: Diagnosis not present

## 2019-02-24 ENCOUNTER — Other Ambulatory Visit: Payer: Self-pay

## 2019-02-24 ENCOUNTER — Encounter (HOSPITAL_BASED_OUTPATIENT_CLINIC_OR_DEPARTMENT_OTHER): Payer: Medicare HMO | Attending: Internal Medicine | Admitting: Internal Medicine

## 2019-02-24 DIAGNOSIS — Z881 Allergy status to other antibiotic agents status: Secondary | ICD-10-CM | POA: Diagnosis not present

## 2019-02-24 DIAGNOSIS — I87312 Chronic venous hypertension (idiopathic) with ulcer of left lower extremity: Secondary | ICD-10-CM | POA: Diagnosis not present

## 2019-02-24 DIAGNOSIS — S81812A Laceration without foreign body, left lower leg, initial encounter: Secondary | ICD-10-CM | POA: Diagnosis not present

## 2019-02-24 DIAGNOSIS — M1990 Unspecified osteoarthritis, unspecified site: Secondary | ICD-10-CM | POA: Insufficient documentation

## 2019-02-24 DIAGNOSIS — X58XXXA Exposure to other specified factors, initial encounter: Secondary | ICD-10-CM | POA: Diagnosis not present

## 2019-02-24 DIAGNOSIS — I890 Lymphedema, not elsewhere classified: Secondary | ICD-10-CM | POA: Diagnosis not present

## 2019-02-24 DIAGNOSIS — I10 Essential (primary) hypertension: Secondary | ICD-10-CM | POA: Insufficient documentation

## 2019-02-24 DIAGNOSIS — L97222 Non-pressure chronic ulcer of left calf with fat layer exposed: Secondary | ICD-10-CM | POA: Diagnosis not present

## 2019-02-24 DIAGNOSIS — S8012XA Contusion of left lower leg, initial encounter: Secondary | ICD-10-CM | POA: Insufficient documentation

## 2019-03-04 ENCOUNTER — Other Ambulatory Visit: Payer: Self-pay

## 2019-03-04 ENCOUNTER — Ambulatory Visit: Payer: Medicare HMO | Admitting: Podiatry

## 2019-03-04 ENCOUNTER — Encounter: Payer: Self-pay | Admitting: Podiatry

## 2019-03-04 VITALS — Temp 97.6°F

## 2019-03-04 DIAGNOSIS — B351 Tinea unguium: Secondary | ICD-10-CM

## 2019-03-04 DIAGNOSIS — M79675 Pain in left toe(s): Secondary | ICD-10-CM

## 2019-03-04 DIAGNOSIS — M79674 Pain in right toe(s): Secondary | ICD-10-CM

## 2019-03-04 NOTE — Patient Instructions (Signed)

## 2019-03-05 ENCOUNTER — Encounter (HOSPITAL_BASED_OUTPATIENT_CLINIC_OR_DEPARTMENT_OTHER): Payer: Medicare HMO | Admitting: Internal Medicine

## 2019-03-05 NOTE — Progress Notes (Signed)
KLEA, NALL ([id number]) Visit Report for 02/24/2019 Chief Complaint Document Details Patient Name: Date of Service: [name], [name] 02/24/2019 10:30 AM Medical Record Patient Account Number: [id number] Number: Treating RN: Carlene Coria Date of Birth/Sex: 03/20/1922 (86 y.o. Female) Other Clinician: Primary Care Provider: Lajean Manes T Treating Provider/Extender:Temesgen Weightman, Legrand Como Referring Provider: Serita Grammes in Treatment: 0 Information Obtained from: Patient Chief Complaint 02/24/2019; patient is here for review of the wound on her left medial lower leg Electronic Signature(s) Signed: 03/02/2019 5:55:43 PM By: Linton Ham MD Signed: 03/05/2019 8:56:47 AM By: Levan Hurst RN, BSN Previous Signature: 02/24/2019 5:18:57 PM Version By: Linton Ham MD Entered By: Levan Hurst on 03/02/2019 10:41:18 -------------------------------------------------------------------------------- Debridement Details Patient Name: Date of Service: [name], [name] 02/24/2019 10:30 AM Medical Record Patient Account Number: [id number] Number: Treating RN: Carlene Coria Date of Birth/Sex: [birth year] (86 y.o. Female) Other Clinician: Primary Care Provider: Lajean Manes T Treating Provider/Extender:Anahis Furgeson, Legrand Como Referring Provider: Serita Grammes in Treatment: 0 Debridement Performed for Wound #1 Left,Medial Lower Leg Assessment: Performed By: Physician Ricard Dillon., MD Debridement Type: Debridement Severity of Tissue Pre Fat layer exposed Debridement: Level of Consciousness (Pre- Awake and Alert procedure): Pre-procedure Verification/Time Out Taken: Yes - 12:07 Start Time: 12:07 Pain Control: Other : benzocaine 20% Total Area Debrided (L x W): 5.5 (cm) x 3.4 (cm) = 18.7 (cm) Tissue and other material Viable, Non-Viable, Slough, Subcutaneous, Skin: Dermis , Skin: Epidermis, Slough debrided: Level: Skin/Subcutaneous  Tissue Debridement Description: Excisional Instrument: Curette Bleeding: Moderate Hemostasis Achieved: Pressure End Time: 12:14 Procedural Pain: 4 Post Procedural Pain: 0 Response to Treatment: Procedure was tolerated well Level of Consciousness Awake and Alert (Post-procedure): Post Debridement Measurements of Total Wound Length: (cm) 5.5 Width: (cm) 3.4 Depth: (cm) 0.1 Volume: (cm) 1.469 Character of Wound/Ulcer Post Improved Debridement: Severity of Tissue Post Debridement: Fat layer exposed Post Procedure Diagnosis Same as Pre-procedure Electronic Signature(s) Signed: 02/24/2019 5:18:57 PM By: Linton Ham MD Signed: 02/25/2019 5:45:19 PM By: Carlene Coria RN Entered By: Linton Ham on 02/24/2019 12:26:55 -------------------------------------------------------------------------------- HPI Details Patient Name: Date of Service: [name], [name] 02/24/2019 10:30 AM Medical Record Patient Account Number: [id number] Number: Treating RN: Carlene Coria Date of Birth/Sex: 10-23-1922 (86 y.o. Female) Other Clinician: Primary Care Provider: Lajean Manes T Treating Provider/Extender:Kacie Huxtable, Legrand Como Referring Provider: Serita Grammes in Treatment: 0 History of Present Illness HPI Description: ADMISSION 02/24/2019 This is a pleasant 86 year old woman who lives in the independent section of the [facility] complex. She apparently suffered a hematoma of her left medial lower leg about a month ago. She is not really sure how this happened however by her description this was clearly a hematoma that then has since evacuated leaving her with a nonhealing wound in this area. She saw her primary physician Dr. Felipa Eth had Keflex for 10 days and then subsequently Augmentin which I think she may still be on. She had Telfa and then Xeroform cover on this on arrival to our clinic. Past medical history includes a left hip fracture in October 2020, osteoporosis,  hypertension and mitral regurgitation. Her ABI in our clinic was 1.29 on the left Electronic Signature(s) Signed: 02/24/2019 5:18:57 PM By: Linton Ham MD Entered By: Linton Ham on 02/24/2019 12:31:48 -------------------------------------------------------------------------------- Physical Exam Details Patient Name: Date of Service: [name], [name] 02/24/2019 10:30 AM Medical Record Patient Account Number: [id number] Number: Treating RN: Carlene Coria Date of Birth/Sex: 1922-07-20 (86 y.o. Female) Other Clinician: Primary Care Provider: Lajean Manes T Treating Provider/Extender:Indie Nickerson, Legrand Como Referring  Provider: Patrina Levering Weeks in Treatment: 0 Constitutional Sitting or standing Blood Pressure is within target range for patient.. Pulse regular and within target range for patient.Marland Kitchen Respirations regular, non-labored and within target range.. Temperature is normal and within the target range for the patient.Marland Kitchen Appears in no distress. Cardiovascular Her pedal pulses were palpable at the dorsalis pedis, faintly at the posterior tibial, popliteal and femoral pulses both palpable.. She is swelling in the lower extremities but this is nonpitting. Hemosiderin deposition compatible with chronic venous insufficiency. Integumentary (Hair, Skin) There is no evidence of surrounding infection. Notes Wound exam; the area is on the left medial calf. Superficial wound but 100% covered and adherent surface debris. Using a #5 curette I was able to remove this removing adherent surface material and subsubcutaneous tissue. Hemostasis with direct pressure. Electronic Signature(s) Signed: 02/24/2019 5:18:57 PM By: Linton Ham MD Entered By: Linton Ham on 02/24/2019 12:33:22 -------------------------------------------------------------------------------- Physician Orders Details Patient Name: Date of Service: [name], [name] 02/24/2019 10:30 AM Medical Record Patient  Account Number: [id number] Medical Record Patient Account Number: [id number] Number: Treating RN: Carlene Coria Date of Birth/Sex: Jul 08, 1922 (86 y.o. Female) Other Clinician: Primary Care Provider: Lajean Manes T Treating Provider/Extender:Alante Tolan, Legrand Como Referring Provider: Serita Grammes in Treatment: 0 Verbal / Phone Orders: No Diagnosis Coding Follow-up Appointments Return Appointment in 1 week. - Tuesday Dressing Change Frequency Other: - 2 times per week, [place] to change on Fridays, please fax to [facility] at [phone], phone # [phone] wound center to change on tuesday Skin Barriers/Peri-Wound Care Wound #1 Left,Medial Lower Leg TCA Cream or Ointment Wound Cleansing May shower with protection. Primary Wound Dressing Wound #1 Left,Medial Lower Leg Iodoflex Secondary Dressing Wound #1 Left,Medial Lower Leg Dry Gauze ABD pad Edema Control 3 Layer Compression System - Left Lower Extremity Electronic Signature(s) Signed: 02/24/2019 5:18:57 PM By: Linton Ham MD Signed: 02/25/2019 5:45:19 PM By: Carlene Coria RN Entered By: Carlene Coria on 02/24/2019 12:20:29 -------------------------------------------------------------------------------- Problem List Details Patient Name: Date of Service: [name], [name] 02/24/2019 10:30 AM Medical Record Patient Account Number: [id number] Number: Treating RN: Carlene Coria Date of Birth/Sex: 02/08/1922 (86 y.o. Female) Other Clinician: Primary Care Provider: Lajean Manes T Treating Provider/Extender:Markie Frith, Legrand Como Referring Provider: Serita Grammes in Treatment: 0 Active Problems ICD-10 Evaluated Encounter Evaluated Encounter Code Description Active Date Today Diagnosis S80.12XD Contusion of left lower leg, subsequent encounter 02/24/2019 No Yes I87.322 Chronic venous hypertension (idiopathic) with 02/24/2019 No Yes inflammation of left lower extremity I89.0 Lymphedema, not  elsewhere classified 02/24/2019 No Yes Inactive Problems Resolved Problems Electronic Signature(s) Signed: 02/24/2019 5:18:57 PM By: Linton Ham MD Entered By: Linton Ham on 02/24/2019 12:25:40 -------------------------------------------------------------------------------- Progress Note Details Patient Name: Date of Service: [name], [name] 02/24/2019 10:30 AM Medical Record Patient Account Number: [id number] Number: Treating RN: Carlene Coria Date of Birth/Sex: Dec 11, 1922 (86 y.o. Female) Other Clinician: Primary Care Provider: Lajean Manes T Treating Provider/Extender:Claris Pech, Legrand Como Referring Provider: Serita Grammes in Treatment: 0 Subjective Chief Complaint Information obtained from Patient 02/24/2019; patient is here for review of the wound on her left medial lower leg History of Present Illness (HPI) ADMISSION 02/24/2019 This is a pleasant 86 year old woman who lives in the independent section of the [facility] complex. She apparently suffered a hematoma of her left medial lower leg about a month ago. She is not really sure how this happened however by her description this was clearly a hematoma that then has since evacuated leaving her with a nonhealing wound in  this area. She saw her primary physician Dr. Felipa Eth had Keflex for 10 days and then subsequently Augmentin which I think she may still be on. She had Telfa and then Xeroform cover on this on arrival to our clinic. Past medical history includes a left hip fracture in October 2020, osteoporosis, hypertension and mitral regurgitation. Her ABI in our clinic was 1.29 on the left Patient History Information obtained from Patient. Allergies Macrobid (Severity: Moderate, Reaction: shortness of breath) Family History Cancer - Mother,Father, Hypertension - Mother, Stroke - Mother, No family history of Diabetes, Heart Disease, Hereditary Spherocytosis, Kidney Disease, Lung Disease,  Seizures, Thyroid Problems, Tuberculosis. Social History Never smoker, Marital Status - Married, Alcohol Use - Never, Drug Use - No History, Caffeine Use - Moderate. Medical History Cardiovascular Patient has history of Deep Vein Thrombosis - 30 years ago, Hypertension Musculoskeletal Patient has history of Osteoarthritis Medical And Surgical History Notes Cardiovascular Mitral valve regurgitation Musculoskeletal Osteopenia Review of Systems (ROS) Constitutional Symptoms (General Health) Denies complaints or symptoms of Fatigue, Fever, Chills, Marked Weight Change. Eyes Complains or has symptoms of Glasses / Contacts - glasses. Ear/Nose/Mouth/Throat Denies complaints or symptoms of Chronic sinus problems or rhinitis. Respiratory Denies complaints or symptoms of Chronic or frequent coughs, Shortness of Breath. Gastrointestinal Denies complaints or symptoms of Frequent diarrhea, Nausea, Vomiting. Endocrine Denies complaints or symptoms of Heat/cold intolerance. Genitourinary Denies complaints or symptoms of Frequent urination. Integumentary (Skin) Complains or has symptoms of Wounds - wound on left lower leg. Neurologic Denies complaints or symptoms of Numbness/parasthesias. Psychiatric Denies complaints or symptoms of Claustrophobia, Suicidal. Objective Constitutional Sitting or standing Blood Pressure is within target range for patient.. Pulse regular and within target range for patient.Marland Kitchen Respirations regular, non-labored and within target range.. Temperature is normal and within the target range for the patient.Marland Kitchen Appears in no distress. Vitals Time Taken: 11:13 AM, Height: 63 in, Source: Stated, Weight: 155 lbs, Source: Stated, BMI: 27.5, Temperature: 97.6 F, Pulse: 78 bpm, Respiratory Rate: 16 breaths/min, Blood Pressure: 139/49 mmHg. Cardiovascular Her pedal pulses were palpable at the dorsalis pedis, faintly at the posterior tibial, popliteal and femoral pulses  both palpable.. She is swelling in the lower extremities but this is nonpitting. Hemosiderin deposition compatible with chronic venous insufficiency. General Notes: Wound exam; the area is on the left medial calf. Superficial wound but 100% covered and adherent surface debris. Using a #5 curette I was able to remove this removing adherent surface material and subsubcutaneous tissue. Hemostasis with direct pressure. Integumentary (Hair, Skin) There is no evidence of surrounding infection. Wound #1 status is Open. Original cause of wound was Not Known. The wound is located on the Left,Medial Lower Leg. The wound measures 5.5cm length x 3.4cm width x 0.1cm depth; 14.687cm^2 area and 1.469cm^3 volume. There is Fat Layer (Subcutaneous Tissue) Exposed exposed. There is no tunneling or undermining noted. There is a medium amount of serosanguineous drainage noted. The wound margin is flat and intact. There is small (1-33%) pink granulation within the wound bed. There is a large (67-100%) amount of necrotic tissue within the wound bed including Adherent Slough. Assessment Active Problems ICD-10 Contusion of left lower leg, subsequent encounter Chronic venous hypertension (idiopathic) with inflammation of left lower extremity Lymphedema, not elsewhere classified Procedures Wound #1 Pre-procedure diagnosis of Wound #1 is a Venous Leg Ulcer located on the Left,Medial Lower Leg .Severity of Tissue Pre Debridement is: Fat layer exposed. There was a Excisional Skin/Subcutaneous Tissue Debridement with a total area of 18.7 sq cm performed  by Ricard Dillon., MD. With the following instrument(s): Curette to remove Viable and Non-Viable tissue/material. Material removed includes Subcutaneous Tissue, Slough, Skin: Dermis, and Skin: Epidermis after achieving pain control using Other (benzocaine 20%). No specimens were taken. A time out was conducted at 12:07, prior to the start of the procedure. A Moderate  amount of bleeding was controlled with Pressure. The procedure was tolerated well with a pain level of 4 throughout and a pain level of 0 following the procedure. Post Debridement Measurements: 5.5cm length x 3.4cm width x 0.1cm depth; 1.469cm^3 volume. Character of Wound/Ulcer Post Debridement is improved. Severity of Tissue Post Debridement is: Fat layer exposed. Post procedure Diagnosis Wound #1: Same as Pre-Procedure Pre-procedure diagnosis of Wound #1 is a Venous Leg Ulcer located on the Left,Medial Lower Leg . There was a Three Layer Compression Therapy Procedure by Carlene Coria, RN. Post procedure Diagnosis Wound #1: Same as Pre-Procedure Plan Follow-up Appointments: Return Appointment in 1 week. - Tuesday Dressing Change Frequency: Other: - 2 times per week, [place] to change on Fridays, please fax to [facility] at [phone], phone # [phone] wound center to change on tuesday Skin Barriers/Peri-Wound Care: Wound #1 Left,Medial Lower Leg: TCA Cream or Ointment Wound Cleansing: May shower with protection. Primary Wound Dressing: Wound #1 Left,Medial Lower Leg: Iodoflex Secondary Dressing: Wound #1 Left,Medial Lower Leg: Dry Gauze ABD pad Edema Control: 3 Layer Compression System - Left Lower Extremity 1. Difficult debridement but managed to get to a healthy surface 2. Primary dressing will be Iodoflex to see if we can prevent the surface from reoccurring. ABDs under 3 layer compression which should suffice to control the swelling from chronic venous insufficiency 3. We will have this changed on Friday by the staff at the [facility] complex and will see her back next Tuesday 4. I do not believe arterial insufficiency is playing a role in the delay in getting this to heal. I think is much more a nonviable surface and swelling related to chronic venous insufficiency and secondary lymphedema 5. No current evidence of infection I did not alter her antibiotics. No  cultures were done I spent 35 minutes in the research and review of patient's record including her accompanying notes, face-to-face evaluation of the patient and preparation of this record Electronic Signature(s) Signed: 03/02/2019 5:55:43 PM By: Linton Ham MD Signed: 03/05/2019 8:56:47 AM By: Levan Hurst RN, BSN Previous Signature: 02/24/2019 5:18:57 PM Version By: Linton Ham MD Entered By: Levan Hurst on 03/02/2019 10:51:32 -------------------------------------------------------------------------------- HxROS Details Patient Name: Date of Service: [name], [name] 02/24/2019 10:30 AM Medical Record Patient Account Number: [id number] Number: Treating RN: Levan Hurst Date of Birth/Sex: 06-20-1922 (86 y.o. Female) Other Clinician: Primary Care Provider: Lajean Manes T Treating Provider/Extender:Mateen Franssen, Legrand Como Referring Provider: Serita Grammes in Treatment: 0 Information Obtained From Patient Constitutional Symptoms (General Health) Complaints and Symptoms: Negative for: Fatigue; Fever; Chills; Marked Weight Change Eyes Complaints and Symptoms: Positive for: Glasses / Contacts - glasses Ear/Nose/Mouth/Throat Complaints and Symptoms: Negative for: Chronic sinus problems or rhinitis Respiratory Complaints and Symptoms: Negative for: Chronic or frequent coughs; Shortness of Breath Gastrointestinal Complaints and Symptoms: Negative for: Frequent diarrhea; Nausea; Vomiting Endocrine Complaints and Symptoms: Negative for: Heat/cold intolerance Genitourinary Complaints and Symptoms: Negative for: Frequent urination Integumentary (Skin) Complaints and Symptoms: Positive for: Wounds - wound on left lower leg Neurologic Complaints and Symptoms: Negative for: Numbness/parasthesias Psychiatric Complaints and Symptoms: Negative for: Claustrophobia; Suicidal Hematologic/Lymphatic Cardiovascular Medical History: Positive for: Deep Vein  Thrombosis -  30 years ago; Hypertension Past Medical History Notes: Mitral valve regurgitation Immunological Musculoskeletal Medical History: Positive for: Osteoarthritis Past Medical History Notes: Osteopenia Oncologic Immunizations Pneumococcal Vaccine: Received Pneumococcal Vaccination: Yes Implantable Devices None Family and Social History Cancer: Yes - Mother,Father; Diabetes: No; Heart Disease: No; Hereditary Spherocytosis: No; Hypertension: Yes - Mother; Kidney Disease: No; Lung Disease: No; Seizures: No; Stroke: Yes - Mother; Thyroid Problems: No; Tuberculosis: No; Never smoker; Marital Status - Married; Alcohol Use: Never; Drug Use: No History; Caffeine Use: Moderate; Financial Concerns: No; Food, Clothing or Shelter Needs: No; Support System Lacking: No; Transportation Concerns: No Electronic Signature(s) Signed: 02/24/2019 5:18:57 PM By: Linton Ham MD Signed: 03/05/2019 8:56:47 AM By: Levan Hurst RN, BSN Entered By: Levan Hurst on 02/24/2019 11:30:46 -------------------------------------------------------------------------------- [place] Details Patient Name: Date of Service: [name], [name] 02/24/2019 Medical Record [id number] Patient Account Number: [id number] Date of Birth/Sex: Treating RN: 1922/02/29 (86 y.o. Female) Carlene Coria Primary Care Provider: Lajean Manes T Other Clinician: Referring Provider: Treating Provider/Extender:Madoc Holquin, Secundino Ginger, Hal T Weeks in Treatment: 0 Diagnosis Coding ICD-10 Codes Code Description S80.12XD Contusion of left lower leg, subsequent encounter I87.322 Chronic venous hypertension (idiopathic) with inflammation of left lower extremity I89.0 Lymphedema, not elsewhere classified Facility Procedures CPT4 Code Description: [id number] 99213 - WOUND CARE VISIT-LEV 3 EST PT Modifier: 25 Quantity: 1 CPT4 Code Description: [id number] 11042 - DEB SUBQ TISSUE 20 SQ CM/< ICD-10 Diagnosis Description S80.12XD  Contusion of left lower leg, subsequent encounter I87.322 Chronic venous hypertension (idiopathic) with inflammation I89.0 Lymphedema, not elsewhere  classified Modifier: of left lowe Quantity: 1 r extremity Physician Procedures CPT4 Code Description: [id number] [place] PHYS LEVEL 3 NEW PT ICD-10 Diagnosis Description S80.12XD Contusion of left lower leg, subsequent encounter I87.322 Chronic venous hypertension (idiopathic) with inflammat I89.0 Lymphedema, not elsewhere classified Modifier: 25 ion of left lowe Quantity: 1 r extremity CPT4 Code Description: [id number] - WC PHYS SUBQ TISS 20 SQ CM ICD-10 Diagnosis Description S80.12XD Contusion of left lower leg, subsequent encounter I87.322 Chronic venous hypertension (idiopathic) with inflammat I89.0 Lymphedema, not elsewhere  classified Modifier: ion of left lowe Quantity: 1 r extremity Electronic Signature(s) Signed: 02/24/2019 5:18:57 PM By: Linton Ham MD Signed: 02/25/2019 5:45:19 PM By: Carlene Coria RN Entered By: Carlene Coria on 02/24/2019 17:00:24

## 2019-03-05 NOTE — Progress Notes (Signed)
Subjective: [name] presents today for follow up of painful mycotic nails b/l that are difficult to trim. Pain interferes with ambulation. Aggravating factors include wearing enclosed shoe gear. Pain is relieved with periodic professional debridement.   Allergies  Allergen Reactions  . Macrobid [Nitrofurantoin Macrocrystal]     Caused shortness of breath.  . Other Nausea And Vomiting    All seafood     Objective: Vitals:   03/04/19 1016  Temp: 97.6 F (36.4 C)    Vascular Examination:  Capillary fill time to digits <3s b/l, palpable DP pulses b/l, palpable PT pulses b/l, pedal hair sparse b/l and skin temperature gradient within normal limits b/l  Dermatological Examination: Pedal skin with normal turgor, texture and tone bilaterally, no open wounds bilaterally, no interdigital macerations bilaterally and toenails 1-5 b/l elongated, dystrophic, thickened, crumbly with subungual debris  Musculoskeletal: Normal muscle strength 5/5 to all lower extremity muscle groups bilaterally, no pain crepitus or joint limitation noted with ROM b/l and pes planus deformity noted  Neurological: Protective sensation intact 5/5 intact bilaterally with 10g monofilament b/l and vibratory sensation intact b/l  Assessment: 1. Pain due to onychomycosis of toenails of both feet    Plan: -Toenails 1-5 b/l were debrided in length and girth with sterile nail nippers and dremel without iatrogenic bleeding. -Patient to continue soft, supportive shoe gear daily. -Patient to report any pedal injuries to medical professional immediately. -Patient/POA to call should there be question/concern in the interim.  Return in about 3 months (around 06/01/2019) for Nail trim.

## 2019-03-05 NOTE — Progress Notes (Signed)
[name], [name] ([id number]) Visit Report for 02/24/2019 Abuse/Suicide Risk Screen Details Patient Name: Date of Service: [name], [name] 02/24/2019 10:30 AM Medical Record Patient Account Number: [id number] Number: Treating RN: Levan Hurst Date of Birth/Sex: 1922-12-02 (86 y.o. Female) Other Clinician: Primary Care Armine Rizzolo: Lajean Manes T Treating Iraida Cragin/Extender:Robson, Legrand Como Referring Aarika Moon: Patrina Levering Weeks in Treatment: 0 Abuse/Suicide Risk Screen Items Answer ABUSE RISK SCREEN: Has anyone close to you tried to hurt or harm you recentlyo No Do you feel uncomfortable with anyone in your familyo No Has anyone forced you do things that you didnt want to doo No Electronic Signature(s) Signed: 03/05/2019 8:56:47 AM By: Levan Hurst RN, BSN Entered By: Levan Hurst on 02/24/2019 11:31:21 -------------------------------------------------------------------------------- Activities of Daily Living Details Patient Name: Date of Service: [name], [name] 02/24/2019 10:30 AM Medical Record Patient Account Number: [id number] Number: Treating RN: Levan Hurst Date of Birth/Sex: 1923/01/05 (86 y.o. Female) Other Clinician: Primary Care Kimesha Claxton: Lajean Manes T Treating Aeon Koors/Extender:Robson, Legrand Como Referring Opie Fanton: Serita Grammes in Treatment: 0 Activities of Daily Living Items Answer Activities of Daily Living (Please select one for each item) Drive Automobile Not Able Take Medications Completely Able Use Telephone Completely Able Care for Appearance Completely Able Use Toilet Completely Able Bath / Shower Need Assistance Dress Self Completely Able Feed Self Completely Able Walk Need Assistance Get In / Out Bed Completely [place] for Self Need Assistance Electronic Signature(s) Signed: 03/05/2019 8:56:47 AM By: Levan Hurst RN,  BSN Entered By: Levan Hurst on 02/24/2019 11:31:44 -------------------------------------------------------------------------------- Education Screening Details Patient Name: Date of Service: [name], [name] 02/24/2019 10:30 AM Medical Record Patient Account Number: [id number] Number: Treating RN: Levan Hurst Date of Birth/Sex: 10/04/1922 (86 y.o. Female) Other Clinician: Primary Care Vermell Madrid: Lajean Manes T Treating Ronan Duecker/Extender:Robson, Legrand Como Referring Orit Sanville: Serita Grammes in Treatment: 0 Primary Learner Assessed: Patient Learning Preferences/Education Level/Primary Language Learning Preference: Explanation, Demonstration, Printed Material Highest Education Level: High School Preferred Language: English Cognitive Barrier Language Barrier: No Translator Needed: No Memory Deficit: No Emotional Barrier: No Cultural/Religious Beliefs Affecting Medical Care: No Physical Barrier Impaired Vision: No Impaired Hearing: No Decreased Hand dexterity: No Knowledge/Comprehension Knowledge Level: High Comprehension Level: High Ability to understand written High instructions: Ability to understand verbal High instructions: Motivation Anxiety Level: Calm Cooperation: Cooperative Education Importance: Acknowledges Need Interest in Health Problems: Asks Questions Perception: Coherent Willingness to Engage in Self- High Management Activities: Readiness to Engage in Self- High Management Activities: Electronic Signature(s) Signed: 03/05/2019 8:56:47 AM By: Levan Hurst RN, BSN Entered By: Levan Hurst on 02/24/2019 11:32:02 -------------------------------------------------------------------------------- Fall Risk Assessment Details Patient Name: Date of Service: [name], [name] 02/24/2019 10:30 AM Medical Record Patient Account Number: [id number] Number: Treating RN: Levan Hurst Date of Birth/Sex: [birth year] (86 y.o.  Female) Other Clinician: Primary Care Garison Genova: Lajean Manes T Treating Arthea Nobel/Extender:Robson, Legrand Como Referring Genesee Nase: Serita Grammes in Treatment: 0 Fall Risk Assessment Items Have you had 2 or more falls in the last 12 monthso 0 Yes Have you had any fall that resulted in injury in the last 12 monthso 0 No FALLS RISK SCREEN History of falling - immediate or within 3 months 0 No Secondary diagnosis (Do you have 2 or more medical diagnoseso) 15 Yes Ambulatory aid None/bed rest/wheelchair/nurse 0 No Crutches/cane/walker 15 Yes Furniture 0 No Intravenous therapy Access/Saline/Heparin Lock 0 No Gait/Transferring Normal/ bed rest/ wheelchair 0 Yes Impaired (short steps with shuffle, may have difficulty arising from  chair, 0 No head down, impaired balance) Mental Status Oriented to own ability 0 Yes Overestimates or forgets limitations 0 No Risk Level: Medium Risk Score: 30 Electronic Signature(s) Signed: 03/05/2019 8:56:47 AM By: Levan Hurst RN, BSN Entered By: Levan Hurst on 02/24/2019 11:32:20 -------------------------------------------------------------------------------- Foot Assessment Details Patient Name: Date of Service: [name], [name] 02/24/2019 10:30 AM Medical Record Patient Account Number: [id number] Number: Treating RN: Levan Hurst Date of Birth/Sex: 02-15-1922 (86 y.o. Female) Other Clinician: Primary Care Aashir Umholtz: Lajean Manes T Treating Kenyonna Micek/Extender:Robson, Legrand Como Referring Maren Wiesen: Serita Grammes in Treatment: 0 Foot Assessment Items Site Locations + = Sensation present, - = Sensation absent, C = Callus, U = Ulcer R = Redness, W = Warmth, M = Maceration, PU = Pre-ulcerative lesion F = Fissure, S = Swelling, D = Dryness Assessment Right: Left: Other Deformity: No No Prior Foot Ulcer: No No Prior Amputation: No No Charcot Joint: No No Ambulatory Status: Ambulatory With Help Assistance Device:  Walker GaitEnergy manager) Signed: 03/05/2019 8:56:47 AM By: Levan Hurst RN, BSN Entered By: Levan Hurst on 02/24/2019 11:33:49 -------------------------------------------------------------------------------- Nutrition Risk Screening Details Patient Name: Date of Service: [name], [name] 02/24/2019 10:30 AM Medical Record Patient Account Number: [id number] Number: Treating RN: Levan Hurst Date of Birth/Sex: 12/20/1922 (86 y.o. Female) Other Clinician: Primary Care Jood Retana: Lajean Manes T Treating Mylah Baynes/Extender:Robson, Legrand Como Referring Chancy Smigiel: Lajean Manes T Weeks in Treatment: 0 Height (in): 63 Weight (lbs): 155 Body Mass Index (BMI): 27.5 Nutrition Risk Screening Items Score Screening NUTRITION RISK SCREEN: I have an illness or condition that made me change the kind and/or 0 No amount of food I eat I eat fewer than two meals per day 0 No I eat few fruits and vegetables, or milk products 0 No I have three or more drinks of beer, liquor or wine almost every day 0 No I have tooth or mouth problems that make it hard for me to eat 0 No I don't always have enough money to buy the food I need 0 No I eat alone most of the time 0 No I take three or more different prescribed or over-the-counter drugs a day 1 Yes 0 No Without wanting to, I have lost or gained 10 pounds in the last six months I am not always physically able to shop, cook and/or feed myself 2 Yes Nutrition Protocols Good Risk Protocol Provide education on Moderate Risk Protocol 0 nutrition High Risk Proctocol Risk Level: Moderate Risk Score: 3 Electronic Signature(s) Signed: 03/05/2019 8:56:47 AM By: Levan Hurst RN, BSN Entered By: Levan Hurst on 02/24/2019 11:32:34

## 2019-03-05 NOTE — Progress Notes (Signed)
Rachael, [name] ([id number]) Visit Report for 02/24/2019 Allergy List Details Patient Name: Date of Service: [name], [name] 02/24/2019 10:30 AM Medical Record [id number] Patient Account Number: [id number] Date of Birth/Sex: Treating RN: 09/13/1922 (86 y.o. Female) Levan Hurst Primary Care Quinntin Malter: Patrina Levering Other Clinician: Referring Conley Delisle: Treating Jamariah Tony/Extender:Robson, Secundino Ginger, Hal T Weeks in Treatment: 0 Allergies Active Allergies Macrobid Reaction: shortness of breath Severity: Moderate Allergy Notes Electronic Signature(s) Signed: 03/05/2019 8:56:47 AM By: Levan Hurst RN, BSN Entered By: Levan Hurst on 02/24/2019 11:16:40 -------------------------------------------------------------------------------- Arrival Information Details Patient Name: Date of Service: Rachael, [name] 02/24/2019 10:30 AM Medical Record [id number] Patient Account Number: [id number] Date of Birth/Sex: Treating RN: 01-16-1922 (86 y.o. Female) Levan Hurst Primary Care Brandin Stetzer: Patrina Levering Other Clinician: Referring Samona Chihuahua: Treating Jasmond River/Extender:Robson, Secundino Ginger, Junius Creamer in Treatment: 0 Visit Information Patient Arrived: Wheel Chair Arrival Time: 11:13 Accompanied By: caregiver Transfer Assistance: None Patient Identification Verified: Yes Secondary Verification Process Completed: Yes Patient Requires Transmission-Based No Precautions: Patient Has Alerts: No Electronic Signature(s) Signed: 03/05/2019 8:56:47 AM By: Levan Hurst RN, BSN Entered By: Levan Hurst on 02/24/2019 11:13:57 -------------------------------------------------------------------------------- Compression Therapy Details Patient Name: Date of Service: Rachael, [name] 02/24/2019 10:30 AM Medical Record [id number] Patient Account Number: [id number] Date of Birth/Sex: 02-22-1922 (86 y.o. Female) Treating RN: Carlene Coria Primary Care  Courage Biglow: Patrina Levering Other Clinician: Referring Arelene Moroni: Treating Jasin Brazel/Extender:Robson, Secundino Ginger, Hal T Weeks in Treatment: 0 Compression Therapy Performed for Wound Wound #1 Left,Medial Lower Leg Assessment: Performed By: Clinician Carlene Coria, RN Compression Type: Three Layer Post Procedure Diagnosis Same as Pre-procedure Electronic Signature(s) Signed: 02/25/2019 5:45:19 PM By: Carlene Coria RN Entered By: Carlene Coria on 02/24/2019 12:15:41 -------------------------------------------------------------------------------- Encounter Discharge Information Details Patient Name: Date of Service: Rachael, [name] 02/24/2019 10:30 AM Medical Record [id number] Patient Account Number: [id number] Date of Birth/Sex: Treating RN: 04-23-1922 (86 y.o. Female) Kela Millin Primary Care Linnaea Ahn: Patrina Levering Other Clinician: Referring Berdie Malter: Treating Maven Rosander/Extender:Robson, Secundino Ginger, Hal T Weeks in Treatment: 0 Encounter Discharge Information Items Post Procedure Vitals Discharge Condition: Stable Temperature (F): 97.6 Ambulatory Status: Wheelchair Pulse (bpm): 78 Discharge Destination: Home Respiratory Rate (breaths/min): 16 Transportation: Private Auto Blood Pressure (mmHg): 139/49 Accompanied By: caregiver Schedule Follow-up Appointment: Yes Clinical Summary of Care: Patient Declined Electronic Signature(s) Signed: 02/24/2019 5:22:24 PM By: Kela Millin Entered By: Kela Millin on 02/24/2019 14:19:43 -------------------------------------------------------------------------------- Lower Extremity Assessment Details Patient Name: Date of Service: Rachael, [name] 02/24/2019 10:30 AM Medical Record [id number] Patient Account Number: [id number] Date of Birth/Sex: Treating RN: 1922-11-30 (86 y.o. Female) Levan Hurst Primary Care Farin Buhman: Lajean Manes T Other Clinician: Referring Shaymus Eveleth: Treating  Desarae Placide/Extender:Robson, Secundino Ginger, Hal T Weeks in Treatment: 0 Edema Assessment Assessed: [Left: No] [Right: No] Edema: [Left: Ye] [Right: s] Calf Left: Right: Point of Measurement: 30 cm From Medial Instep 32.5 cm cm Ankle Left: Right: Point of Measurement: 11 cm From Medial Instep 20 cm cm Vascular Assessment Pulses: Dorsalis Pedis Palpable: [Left:No] Posterior Tibial Palpable: [Left:Yes] Blood Pressure: Brachial: [Left:140] Ankle: [Left:Dorsalis Pedis: 180 1.29] Electronic Signature(s) Signed: 03/05/2019 8:56:47 AM By: Levan Hurst RN, BSN Entered By: Levan Hurst on 02/24/2019 11:41:48 -------------------------------------------------------------------------------- Multi Wound Chart Details Patient Name: Date of Service: Rachael, [name] 02/24/2019 10:30 AM Medical Record [id number] Patient Account Number: [id number] Date of Birth/Sex: Feb 23, 1922 (86 y.o. Female) Treating RN: Carlene Coria Primary Care Marissia Blackham: Other Clinician: Lajean Manes T Referring Maloree Uplinger: Treating Chapman Matteucci/Extender:Robson, Secundino Ginger, Hal T Weeks in Treatment: 0 Vital Signs Height(in): 63 Pulse(bpm): 78 Weight(lbs): 155 Blood Pressure(mmHg): 139/49 Body  Mass Index(BMI): 27 Temperature(F): 97.6 Respiratory 16 Rate(breaths/min): Photos: [1:No Photos] [N/A:N/A] Wound Location: [1:Left Lower Leg - Medial] [N/A:N/A] Wounding Event: [1:Not Known] [N/A:N/A] Primary Etiology: [1:Abscess] [N/A:N/A] Secondary Etiology: [1:Venous Leg Ulcer] [N/A:N/A] Comorbid History: [1:Deep Vein Thrombosis, Hypertension, Osteoarthritis] [N/A:N/A] Date Acquired: [1:01/09/2019] [N/A:N/A] Weeks of Treatment: [1:0] [N/A:N/A] Wound Status: [1:Open] [N/A:N/A] Measurements L x W x D 5.5x3.4x0.1 [N/A:N/A] (cm) Area (cm) : [1:14.687] [N/A:N/A] Volume (cm) : [1:1.469] [N/A:N/A] Classification: [1:Full Thickness Without Exposed Support Structures] [N/A:N/A] Exudate Amount: [1:Medium]  [N/A:N/A] Exudate Type: [1:Serosanguineous] [N/A:N/A] Exudate Color: [1:red, brown] [N/A:N/A] Wound Margin: [1:Flat and Intact] [N/A:N/A] Granulation Amount: [1:Small (1-33%)] [N/A:N/A] Granulation Quality: [1:Pink] [N/A:N/A] Necrotic Amount: [1:Large (67-100%)] [N/A:N/A] Exposed Structures: [1:Fat Layer (Subcutaneous N/A Tissue) Exposed: Yes Fascia: No Tendon: No Muscle: No Joint: No Bone: No] Epithelialization: [1:None] [N/A:N/A] Debridement: [1:Debridement - Excisional N/A] Pre-procedure [1:12:07] [N/A:N/A] Verification/Time Out Taken: Pain Control: [1:Other] [N/A:N/A] Tissue Debrided: [1:Subcutaneous, Slough] [N/A:N/A] Level: [1:Skin/Subcutaneous Tissue N/A] Debridement Area (sq cm):18.7 [N/A:N/A] Instrument: [1:Curette] [N/A:N/A] Bleeding: [1:Moderate] [N/A:N/A] Hemostasis Achieved: [1:Pressure] [N/A:N/A] Procedural Pain: [1:4] [N/A:N/A] Post Procedural Pain: [1:0] [N/A:N/A] Debridement Treatment Procedure was tolerated [N/A:N/A] Response: [1:well] Post Debridement [1:5.5x3.4x0.1] [N/A:N/A] Measurements L x W x D (cm) Post Debridement [1:1.469] [N/A:N/A] Volume: (cm) Procedures Performed: [1:Compression Therapy Debridement] [N/A:N/A] Treatment Notes Electronic Signature(s) Signed: 02/24/2019 5:18:57 PM By: Linton Ham MD Signed: 02/25/2019 5:45:19 PM By: Carlene Coria RN Entered By: Linton Ham on 02/24/2019 12:26:47 -------------------------------------------------------------------------------- Multi-Disciplinary Care Plan Details Patient Name: Date of Service: MIRRANDA, MONRROY 02/24/2019 10:30 AM Medical Record [id number] Patient Account Number: [id number] Date of Birth/Sex: Treating RN: October 15, 1922 (86 y.o. Female) Carlene Coria Primary Care Guyla Bless: Lajean Manes T Other Clinician: Referring Anmol Paschen: Treating Dartanion Teo/Extender:Robson, Secundino Ginger, Hal T Weeks in Treatment: 0 Active Inactive Wound/Skin Impairment Nursing  Diagnoses: Knowledge deficit related to ulceration/compromised skin integrity Goals: Patient/caregiver will verbalize understanding of skin care regimen Date Initiated: 02/24/2019 Target Resolution Date: 03/24/2019 Goal Status: Active Ulcer/skin breakdown will have a volume reduction of 30% by week 4 Date Initiated: 02/24/2019 Target Resolution Date: 03/24/2019 Goal Status: Active Interventions: Assess patient/caregiver ability to obtain necessary supplies Assess patient/caregiver ability to perform ulcer/skin care regimen upon admission and as needed Assess ulceration(s) every visit Notes: Electronic Signature(s) Signed: 02/25/2019 5:45:19 PM By: Carlene Coria RN Entered By: Carlene Coria on 02/24/2019 12:04:53 -------------------------------------------------------------------------------- Pain Assessment Details Patient Name: Date of Service: ROBINA, HAMOR 02/24/2019 10:30 AM Medical Record [id number] Patient Account Number: [id number] Date of Birth/Sex: Treating RN: 02-24-1922 (86 y.o. Female) Levan Hurst Primary Care Taye Cato: Patrina Levering Other Clinician: Referring Joelly Bolanos: Treating Damarian Priola/Extender:Robson, Secundino Ginger, Hal T Weeks in Treatment: 0 Active Problems Location of Pain Severity and Description of Pain Patient Has Paino No Site Locations Pain Management and Medication Current Pain Management: Electronic Signature(s) Signed: 03/05/2019 8:56:47 AM By: Levan Hurst RN, BSN Entered By: Levan Hurst on 02/24/2019 11:36:25 -------------------------------------------------------------------------------- Patient/Caregiver Education Details Patient Name: [name] 2/16/2021andnbsp10:30 Date of Service: AM Medical Record [id number] Number: Patient Account Number: [id number] Treating RN: Date of Birth/Gender: Sep 21, 1922 (86 y.o. Carlene Coria Female) Other Clinician: Primary Care Treating Stoneking, Gaylene Brooks Physician: Physician/Extender: Referring Physician: Serita Grammes in Treatment: 0 Education Assessment Education Provided To: Patient Education Topics Provided Wound/Skin Impairment: Methods: Explain/Verbal Responses: State content correctly Electronic Signature(s) Signed: 02/25/2019 5:45:19 PM By: Carlene Coria RN Entered By: Carlene Coria on 02/24/2019 12:05:06 -------------------------------------------------------------------------------- Wound Assessment Details Patient Name: Date of Service: ANALLELI, GIERKE 02/24/2019 10:30 AM Medical Record [id number] Patient Account Number: [id number] Date of  Birth/Sex: Treating RN: 1922/04/20 (86 y.o. Female) Epps, Morey Hummingbird Primary Care Nora Sabey: Lajean Manes T Other Clinician: Referring Glynda Soliday: Treating Bren Borys/Extender:Robson, Secundino Ginger, Hal T Weeks in Treatment: 0 Wound Status Wound Number: 1 Primary Venous Leg Ulcer Etiology: Wound Location: Left Lower Leg - Medial Wound Open Wounding Event: Not Known Status: Date Acquired: 01/09/2019 Comorbid Deep Vein Thrombosis, Hypertension, Weeks Of Treatment: 0 History: Osteoarthritis Clustered Wound: No Photos Wound Measurements Length: (cm) 5.5 % Reducti Width: (cm) 3.4 % Reducti Depth: (cm) 0.1 Epithelia Area: (cm) 14.687 Tunnelin Volume: (cm) 1.469 Undermin Wound Description Full Thickness Without Exposed Support Foul O Classification: Structures Slough Wound Flat and Intact Margin: Exudate Medium Amount: Exudate Serosanguineous Type: Exudate red, brown Color: Wound Bed Granulation Amount: Small (1-33%) Granulation Quality: Pink Fascia Necrotic Amount: Large (67-100%) Fat Lay Necrotic Quality: Adherent Slough Tendon Muscle Joint E Bone Ex dor After Cleansing: No /Fibrino Yes Exposed Structure Exposed: No er (Subcutaneous Tissue) Exposed: Yes Exposed: No Exposed: No xposed: No posed: No on in Area: 0% on in Volume:  0% lization: None g: No ing: No Treatment Notes Wound #1 (Left, Medial Lower Leg) 1. Cleanse With Wound Cleanser 2. Periwound Care TCA Cream 3. Primary Dressing Applied Iodoflex 4. Secondary Dressing ABD Pad Dry Gauze 6. Support Layer Applied 3 layer compression wrap Electronic Signature(s) Signed: 02/24/2019 4:25:10 PM By: Mikeal Hawthorne EMT/HBOT Signed: 02/25/2019 5:45:19 PM By: Carlene Coria RN Entered By: Mikeal Hawthorne on 02/24/2019 15:44:55 -------------------------------------------------------------------------------- Vitals Details Patient Name: Date of Service: PERRIS, TRIPATHI 02/24/2019 10:30 AM Medical Record [id number] Patient Account Number: [id number] Date of Birth/Sex: Treating RN: Oct 19, 1922 (86 y.o. Female) Levan Hurst Primary Care Camelia Stelzner: Lajean Manes T Other Clinician: Referring Dillan Candela: Treating Zailah Zagami/Extender:Robson, Secundino Ginger, Hal T Weeks in Treatment: 0 Vital Signs Time Taken: 11:13 Temperature (F): 97.6 Height (in): 63 Pulse (bpm): 78 Source: Stated Respiratory Rate (breaths/min): 16 Weight (lbs): 155 Blood Pressure (mmHg): 139/49 Source: Stated Reference Range: 80 - 120 mg / dl Body Mass Index (BMI): 27.5 Electronic Signature(s) Signed: 03/05/2019 8:56:47 AM By: Levan Hurst RN, BSN Entered By: Levan Hurst on 02/24/2019 11:15:35

## 2019-03-06 ENCOUNTER — Encounter (HOSPITAL_BASED_OUTPATIENT_CLINIC_OR_DEPARTMENT_OTHER): Payer: Medicare HMO | Admitting: Internal Medicine

## 2019-03-06 ENCOUNTER — Other Ambulatory Visit: Payer: Self-pay

## 2019-03-06 DIAGNOSIS — Z881 Allergy status to other antibiotic agents status: Secondary | ICD-10-CM | POA: Diagnosis not present

## 2019-03-06 DIAGNOSIS — L97222 Non-pressure chronic ulcer of left calf with fat layer exposed: Secondary | ICD-10-CM | POA: Diagnosis not present

## 2019-03-06 DIAGNOSIS — M1990 Unspecified osteoarthritis, unspecified site: Secondary | ICD-10-CM | POA: Diagnosis not present

## 2019-03-06 DIAGNOSIS — S81812A Laceration without foreign body, left lower leg, initial encounter: Secondary | ICD-10-CM | POA: Diagnosis not present

## 2019-03-06 DIAGNOSIS — S8012XA Contusion of left lower leg, initial encounter: Secondary | ICD-10-CM | POA: Diagnosis not present

## 2019-03-06 DIAGNOSIS — I890 Lymphedema, not elsewhere classified: Secondary | ICD-10-CM | POA: Diagnosis not present

## 2019-03-06 DIAGNOSIS — I87312 Chronic venous hypertension (idiopathic) with ulcer of left lower extremity: Secondary | ICD-10-CM | POA: Diagnosis not present

## 2019-03-06 DIAGNOSIS — I10 Essential (primary) hypertension: Secondary | ICD-10-CM | POA: Diagnosis not present

## 2019-03-06 NOTE — Progress Notes (Signed)
[name], [name] ([id number]) Visit Report for 03/06/2019 HPI Details Patient Name: Date of Service: [name], [name] 03/06/2019 11:00 AM Medical Record [id number] Patient Account Number: [id number] Date of Birth/Sex: Treating RN: January 31, 1922 (86 y.o. Clearnce Sorrel Primary Care Provider: Patrina Levering Other Clinician: Referring Provider: Treating Provider/Extender:Chozen Latulippe, Secundino Ginger, Hal T Weeks in Treatment: 1 History of Present Illness HPI Description: ADMISSION 02/24/2019 This is a pleasant 86 year old woman who lives in the independent section of the [facility] complex. She apparently suffered a hematoma of her left medial lower leg about a month ago. She is not really sure how this happened however by her description this was clearly a hematoma that then has since evacuated leaving her with a nonhealing wound in this area. She saw her primary physician Dr. Felipa Eth had Keflex for 10 days and then subsequently Augmentin which I think she may still be on. She had Telfa and then Xeroform cover on this on arrival to our clinic. Past medical history includes a left hip fracture in October 2020, osteoporosis, hypertension and mitral regurgitation. Her ABI in our clinic was 1.29 on the left 2/26; the patient's wound is on the left medial lower leg. Extensive debridement last week. We have been using Iodoflex. Much better Electronic Signature(s) Signed: 03/06/2019 5:26:28 PM By: Linton Ham MD Entered By: Linton Ham on 03/06/2019 12:41:11 -------------------------------------------------------------------------------- Physical Exam Details Patient Name: Date of Service: [name], [name] 03/06/2019 11:00 AM Medical Record [id number] Patient Account Number: [id number] Date of Birth/Sex: Treating RN: 1922/06/28 (86 y.o. Clearnce Sorrel Primary Care Provider: Patrina Levering Other Clinician: Referring Provider: Treating  Provider/Extender:Grady Lucci, Secundino Ginger, Hal T Weeks in Treatment: 1 Constitutional Patient is hypertensive.. Pulse regular and within target range for patient.Marland Kitchen Respirations regular, non-labored and within target range.. Temperature is normal and within the target range for the patient.Marland Kitchen Appears in no distress. Cardiovascular Pedal pulses are palpable. Notes Wound exam; the areas on the left medial calf. Much better looking surface after debridement last week and a week of Iodoflex. She is at the [facility] complex and they are changing the dressings. We have good edema control Electronic Signature(s) Signed: 03/06/2019 5:26:28 PM By: Linton Ham MD Entered By: Linton Ham on 03/06/2019 12:42:02 -------------------------------------------------------------------------------- Physician Orders Details Patient Name: Date of Service: [name], [name] 03/06/2019 11:00 AM Medical Record [id number] Patient Account Number: [id number] Date of Birth/Sex: Treating RN: [birth year] (86 y.o. Clearnce Sorrel Primary Care Provider: Patrina Levering Other Clinician: Referring Provider: Treating Provider/Extender:Cora Stetson, Secundino Ginger, Hal T Weeks in Treatment: 1 Verbal / Phone Orders: No Diagnosis Coding ICD-10 Coding Code Description S80.12XD Contusion of left lower leg, subsequent encounter I87.322 Chronic venous hypertension (idiopathic) with inflammation of left lower extremity I89.0 Lymphedema, not elsewhere classified Follow-up Appointments Return Appointment in 2 weeks. - Tuesdays Dressing Change Frequency Other: - 2 times per week, [place] to change on Fridays, please fax to [place] at [phone], phone # [phone] wound center to change on tuesday Skin Barriers/Peri-Wound Care Wound #1 Left,Medial Lower Leg TCA Cream or Ointment Wound Cleansing May shower with protection. Primary Wound Dressing Wound #1 Left,Medial Lower  Leg Iodoflex Secondary Dressing Wound #1 Left,Medial Lower Leg Dry Gauze ABD pad Edema Control 3 Layer Compression System - Left Lower Extremity Electronic Signature(s) Signed: 03/06/2019 5:17:01 PM By: Kela Millin Signed: 03/06/2019 5:26:28 PM By: Linton Ham MD Entered By: Kela Millin on 03/06/2019 12:13:45 -------------------------------------------------------------------------------- Problem List Details Patient Name: Date of Service: [name], [name] 03/06/2019 11:00 AM Medical Record [id number] Patient Account Number:  [id number] Date of Birth/Sex: Treating RN: [birth year] (86 y.o. Clearnce Sorrel Primary Care Provider: Patrina Levering Other Clinician: Referring Provider: Treating Provider/Extender:Jesilyn Easom, Secundino Ginger, Hal T Weeks in Treatment: 1 Active Problems ICD-10 Evaluated Encounter Code Description Active Date Today Diagnosis S80.12XD Contusion of left lower leg, subsequent encounter 02/24/2019 No Yes I87.322 Chronic venous hypertension (idiopathic) with 02/24/2019 No Yes inflammation of left lower extremity I89.0 Lymphedema, not elsewhere classified 02/24/2019 No Yes Inactive Problems Resolved Problems Electronic Signature(s) Signed: 03/06/2019 5:26:28 PM By: Linton Ham MD Entered By: Linton Ham on 03/06/2019 12:40:21 -------------------------------------------------------------------------------- Progress Note Details Patient Name: Date of Service: [name], [name] 03/06/2019 11:00 AM Medical Record [id number] Patient Account Number: [id number] Date of Birth/Sex: Treating RN: 1922/08/21 (86 y.o. Clearnce Sorrel Primary Care Provider: Patrina Levering Other Clinician: Referring Provider: Treating Provider/Extender:Isiaha Greenup, Secundino Ginger, Hal T Weeks in Treatment: 1 Subjective History of Present Illness (HPI) ADMISSION 02/24/2019 This is a pleasant 86 year old woman who lives in the independent  section of the [facility] complex. She apparently suffered a hematoma of her left medial lower leg about a month ago. She is not really sure how this happened however by her description this was clearly a hematoma that then has since evacuated leaving her with a nonhealing wound in this area. She saw her primary physician Dr. Felipa Eth had Keflex for 10 days and then subsequently Augmentin which I think she may still be on. She had Telfa and then Xeroform cover on this on arrival to our clinic. Past medical history includes a left hip fracture in October 2020, osteoporosis, hypertension and mitral regurgitation. Her ABI in our clinic was 1.29 on the left 2/26; the patient's wound is on the left medial lower leg. Extensive debridement last week. We have been using Iodoflex. Much better Objective Constitutional Patient is hypertensive.. Pulse regular and within target range for patient.Marland Kitchen Respirations regular, non-labored and within target range.. Temperature is normal and within the target range for the patient.Marland Kitchen Appears in no distress. Vitals Time Taken: 11:44 AM, Height: 63 in, Source: Stated, Weight: 155 lbs, Source: Stated, BMI: 27.5, Temperature: 97.7 F, Pulse: 76 bpm, Respiratory Rate: 18 breaths/min, Blood Pressure: 170/54 mmHg. Cardiovascular Pedal pulses are palpable. General Notes: Wound exam; the areas on the left medial calf. Much better looking surface after debridement last week and a week of Iodoflex. She is at the [facility] complex and they are changing the dressings. We have good edema control Integumentary (Hair, Skin) Wound #1 status is Open. Original cause of wound was Not Known. The wound is located on the Left,Medial Lower Leg. The wound measures 4.7cm length x 2.7cm width x 0.1cm depth; 9.967cm^2 area and 0.997cm^3 volume. There is Fat Layer (Subcutaneous Tissue) Exposed exposed. There is no tunneling or undermining noted. There is a medium amount of  serosanguineous drainage noted. The wound margin is flat and intact. There is large (67-100%) red granulation within the wound bed. There is no necrotic tissue within the wound bed. Assessment Active Problems ICD-10 Contusion of left lower leg, subsequent encounter Chronic venous hypertension (idiopathic) with inflammation of left lower extremity Lymphedema, not elsewhere classified Procedures Wound #1 Pre-procedure diagnosis of Wound #1 is a Venous Leg Ulcer located on the Left,Medial Lower Leg . There was a Three Layer Compression Therapy Procedure by Deon Pilling, RN. Post procedure Diagnosis Wound #1: Same as Pre-Procedure Plan Follow-up Appointments: Return Appointment in 2 weeks. - Tuesdays Dressing Change Frequency: Other: - 2 times per week, [place] to change on  Fridays, please fax to [facility] at [phone], phone # [phone] wound center to change on tuesday Skin Barriers/Peri-Wound Care: Wound #1 Left,Medial Lower Leg: TCA Cream or Ointment Wound Cleansing: May shower with protection. Primary Wound Dressing: Wound #1 Left,Medial Lower Leg: Iodoflex Secondary Dressing: Wound #1 Left,Medial Lower Leg: Dry Gauze ABD pad Edema Control: 3 Layer Compression System - Left Lower Extremity 1. Almost surprisingly better than last time. Nice vibrant red surface to the wound. Our measurements measured smaller in terms of surface area 2. Rims of epithelialization noted 3. Continue Iodoflex under 3 layer compression Electronic Signature(s) Signed: 03/06/2019 5:26:28 PM By: Linton Ham MD Entered By: Linton Ham on 03/06/2019 12:44:22 -------------------------------------------------------------------------------- SuperBill Details Patient Name: Date of Service: [name], [name] 03/06/2019 Medical Record [id number] Patient Account Number: [id number] Date of Birth/Sex: Treating RN: 05-Feb-1922 (86 y.o. Clearnce Sorrel Primary Care Provider:  Patrina Levering Other Clinician: Referring Provider: Treating Provider/Extender:Jolissa Kapral, Secundino Ginger, Hal T Weeks in Treatment: 1 Diagnosis Coding ICD-10 Codes Code Description S80.12XD Contusion of left lower leg, subsequent encounter I87.322 Chronic venous hypertension (idiopathic) with inflammation of left lower extremity I89.0 Lymphedema, not elsewhere classified Facility Procedures CPT4 Code Description: [id number] (Facility Use Only) [phone] - [place] COMPRS LWR LT LEG Modifier: Quantity: 1 Physician Procedures CPT4 Code Description: [id number] - WC PHYS LEVEL 3 - EST PT ICD-10 Diagnosis Description S80.12XD Contusion of left lower leg, subsequent encounter I87.322 Chronic venous hypertension (idiopathic) with inflammation extremity I89.0 Lymphedema, not  elsewhere classified Modifier: of left lower Quantity: 1 Electronic Signature(s) Signed: 03/06/2019 5:26:28 PM By: Linton Ham MD Entered By: Linton Ham on 03/06/2019 12:44:42

## 2019-03-06 NOTE — Progress Notes (Signed)
[name], [name] ([id number]) Visit Report for 03/06/2019 Arrival Information Details Patient Name: Date of Service: [name], [name] 03/06/2019 11:00 AM Medical Record [id number] Patient Account Number: [id number] Date of Birth/Sex: Treating RN: [birth year] (86 y.o. [name] Primary Care Dainelle Hun: Lajean Manes T Other Clinician: Referring Judie Hollick: Treating Shermaine Rivet/Extender:Robson, Secundino Ginger, Hal T Weeks in Treatment: 1 Visit Information History Since Last Visit Added or deleted any medications: No Patient Arrived: Wheel Chair Any new allergies or adverse reactions: No Arrival Time: 11:38 Had a fall or experienced change in No activities of daily living that may affect Accompanied By: caregiver risk of falls: Transfer Assistance: None Signs or symptoms of abuse/neglect since last No Patient Identification Verified: Yes visito Secondary Verification Process Completed: Yes Hospitalized since last visit: No Patient Requires Transmission-Based No Implantable device outside of the clinic excluding No Precautions: cellular tissue based products placed in the center Patient Has Alerts: No since last visit: Has Dressing in Place as Prescribed: Yes Has Compression in Place as Prescribed: Yes Pain Present Now: No Electronic Signature(s) Signed: 03/06/2019 5:01:55 PM By: Baruch Gouty RN, BSN Entered By: Baruch Gouty on 03/06/2019 11:44:02 -------------------------------------------------------------------------------- Compression Therapy Details Patient Name: Date of Service: [name], [name] 03/06/2019 11:00 AM Medical Record [id number] Patient Account Number: [id number] Date of Birth/Sex: Treating RN: 1922-04-15 (86 y.o. [name] Primary Care Kemuel Buchmann: Patrina Levering Other Clinician: Referring Maylin Freeburg: Treating Jimmy Plessinger/Extender:Robson, Secundino Ginger, Hal T Weeks in Treatment: 1 Compression Therapy Performed for Wound  Wound #1 Left,Medial Lower Leg Assessment: Performed By: Clinician Deon Pilling, RN Compression Type: Three Layer Post Procedure Diagnosis Same as Pre-procedure Electronic Signature(s) Signed: 03/06/2019 5:17:01 PM By: Kela Millin Entered By: Kela Millin on 03/06/2019 12:24:52 -------------------------------------------------------------------------------- Encounter Discharge Information Details Patient Name: Date of Service: [name] 03/06/2019 11:00 AM Medical Record [id number] Patient Account Number: [id number] Date of Birth/Sex: Treating RN: Mar 15, 1922 (86 y.o. [name] Primary Care Thayden Lemire: Lajean Manes T Other Clinician: Referring Deaisha Welborn: Treating Anastasya Jewell/Extender:Robson, Secundino Ginger, [name] in Treatment: 1 Encounter Discharge Information Items Discharge Condition: Stable Ambulatory Status: Wheelchair Discharge Destination: Home Transportation: Private Auto Accompanied By: facility staff Schedule Follow-up Appointment: Yes Clinical Summary of Care: Patient Declined Electronic Signature(s) Signed: 03/06/2019 5:01:55 PM By: Baruch Gouty RN, BSN Entered By: Baruch Gouty on 03/06/2019 12:27:05 -------------------------------------------------------------------------------- Lower Extremity Assessment Details Patient Name: Date of Service: [name], [name] 03/06/2019 11:00 AM Medical Record [id number] Patient Account Number: [id number] Date of Birth/Sex: Treating RN: 1922-09-25 (86 y.o. [name] Primary Care Kati Riggenbach: Lajean Manes T Other Clinician: Referring Joby Hershkowitz: Treating Marites Nath/Extender:Robson, Secundino Ginger, Hal T Weeks in Treatment: 1 Edema Assessment Assessed: [Left: No] [Right: No] Edema: [Left: Ye] [Right: s] Calf Left: Right: Point of Measurement: 30 cm From Medial Instep 27.2 cm cm Ankle Left: Right: Point of Measurement: 11 cm From Medial Instep 19 cm cm Vascular  Assessment Pulses: Dorsalis Pedis Palpable: [Left:Yes] Electronic Signature(s) Signed: 03/06/2019 5:01:55 PM By: Baruch Gouty RN, BSN Entered By: Baruch Gouty on 03/06/2019 11:55:15 -------------------------------------------------------------------------------- Multi Wound Chart Details Patient Name: Date of Service: [name] 03/06/2019 11:00 AM Medical Record [id number] Patient Account Number: [id number] Date of Birth/Sex: Treating RN: 1922-09-13 (86 y.o. [name] Primary Care Alyanna Stoermer: Lajean Manes T Other Clinician: Referring Frank Pilger: Treating Nohemy Koop/Extender:Robson, Secundino Ginger, Hal T Weeks in Treatment: 1 Vital Signs Height(in): 63 Pulse(bpm): 76 Weight(lbs): 155 Blood Pressure(mmHg): 170/54 Body Mass Index(BMI): 27 Temperature(F): 97.7 Respiratory 18 Rate(breaths/min): Photos: [1:No Photos] [N/A:N/A] Wound Location: [1:Left Lower Leg - Medial] [N/A:N/A] Wounding Event: [1:Not Known] [  N/A:N/A] Primary Etiology: [1:Venous Leg Ulcer] [N/A:N/A] Comorbid History: [1:Deep Vein Thrombosis, Hypertension, Osteoarthritis] [N/A:N/A] Date Acquired: [1:01/09/2019] [N/A:N/A] Weeks of Treatment: [1:1] [N/A:N/A] Wound Status: [1:Open] [N/A:N/A] Measurements L x W x D 4.7x2.7x0.1 [N/A:N/A] (cm) Area (cm) : [1:9.967] [N/A:N/A] Volume (cm) : [1:0.997] [N/A:N/A] % Reduction in Area: [1:32.10%] [N/A:N/A] % Reduction in Volume: 32.10% [N/A:N/A] Classification: [1:Full Thickness Without Exposed Support Structures] [N/A:N/A] Exudate Amount: [1:Medium] [N/A:N/A] Exudate Type: [1:Serosanguineous] [N/A:N/A] Exudate Color: [1:red, brown] [N/A:N/A] Wound Margin: [1:Flat and Intact] [N/A:N/A] Granulation Amount: [1:Large (67-100%)] [N/A:N/A] Granulation Quality: [1:Red] [N/A:N/A] Necrotic Amount: [1:None Present (0%)] [N/A:N/A] Exposed Structures: [1:Fat Layer (Subcutaneous N/A Tissue) Exposed: Yes Fascia: No Tendon: No Muscle: No Joint: No  Bone: No] Epithelialization: [1:Small (1-33%) Compression Therapy] [N/A:N/A N/A] Treatment Notes Wound #1 (Left, Medial Lower Leg) 2. Periwound Care Moisturizing lotion TCA Cream 3. Primary Dressing Applied Iodoflex 4. Secondary Dressing Dry Gauze 6. Support Layer Applied 3 layer compression wrap Electronic Signature(s) Signed: 03/06/2019 5:17:01 PM By: Kela Millin Signed: 03/06/2019 5:26:28 PM By: Linton Ham MD Entered By: Linton Ham on 03/06/2019 12:40:30 -------------------------------------------------------------------------------- [place] Details Patient Name: Date of Service: [name], [name] 03/06/2019 11:00 AM Medical Record [id number] Patient Account Number: [id number] Date of Birth/Sex: Treating RN: June 19, 1922 (86 y.o. [name] Primary Care Tanaysha Alkins: Patrina Levering Other Clinician: Referring Jakobi Thetford: Treating Cleatis Fandrich/Extender:Robson, Secundino Ginger, Hal T Weeks in Treatment: 1 Active Inactive Wound/Skin Impairment Nursing Diagnoses: Knowledge deficit related to ulceration/compromised skin integrity Goals: Patient/caregiver will verbalize understanding of skin care regimen Date Initiated: 02/24/2019 Target Resolution Date: 03/24/2019 Goal Status: Active Ulcer/skin breakdown will have a volume reduction of 30% by week 4 Date Initiated: 02/24/2019 Target Resolution Date: 03/24/2019 Goal Status: Active Interventions: Assess patient/caregiver ability to obtain necessary supplies Assess patient/caregiver ability to perform ulcer/skin care regimen upon admission and as needed Assess ulceration(s) every visit Notes: Electronic Signature(s) Signed: 03/06/2019 5:17:01 PM By: Kela Millin Entered By: Kela Millin on 03/06/2019 11:59:22 -------------------------------------------------------------------------------- Pain Assessment Details Patient Name: Date of Service: [name], [name] 03/06/2019  11:00 AM Medical Record [id number] Patient Account Number: [id number] Date of Birth/Sex: Treating RN: 12-Oct-1922 (86 y.o. [name] Primary Care Neils Siracusa: Lajean Manes T Other Clinician: Referring Taegen Lennox: Treating Khylin Gutridge/Extender:Robson, Secundino Ginger, Hal T Weeks in Treatment: 1 Active Problems Location of Pain Severity and Description of Pain Patient Has Paino Yes Site Locations Pain Location: Generalized Pain With Dressing Change: No Duration of the Pain. Constant / Intermittento Constant Rate the pain. Current Pain Level: 8 Worst Pain Level: 10 Character of Pain Describe the Pain: Aching Pain Management and Medication Current Pain Management: Medication: Yes How does your wound impact your activities of daily livingo Sleep: Yes Bathing: No Appetite: No Relationship With Others: No Bladder Continence: No Emotions: No Bowel Continence: No Work: No Toileting: No Drive: No Dressing: No Hobbies: No Notes reports left shoulder pain, has appointment 3/3 for treatment Electronic Signature(s) Signed: 03/06/2019 5:01:55 PM By: Baruch Gouty RN, BSN Entered By: Baruch Gouty on 03/06/2019 11:48:06 -------------------------------------------------------------------------------- Patient/Caregiver Education Details Patient Name: Date of Service: [name] 2/26/2021andnbsp11:00 AM Medical Record [id number] Patient Account Number: [id number] Date of Birth/Gender: Treating RN: [birth year] (86 y.o. [name] Primary Care Physician: Lajean Manes T Other Clinician: Referring Physician: Treating Physician/Extender:Robson, Secundino Ginger, [name] in Treatment: 1 Education Assessment Education Provided To: Patient Education Topics Provided Wound/Skin Impairment: Methods: Explain/Verbal Responses: State content correctly Electronic Signature(s) Signed: 03/06/2019 5:17:01 PM By: Kela Millin Entered By:  Kela Millin on 03/06/2019 11:59:34 -------------------------------------------------------------------------------- Wound Assessment  Details Patient Name: Date of Service: [name], [name] 03/06/2019 11:00 AM Medical Record [id number] Patient Account Number: [id number] Date of Birth/Sex: Treating RN: 10-Dec-1922 (86 y.o. [name] Primary Care My Madariaga: Other Clinician: Lajean Manes T Referring Zyann Mabry: Treating Roiza Wiedel/Extender:Robson, Secundino Ginger, Hal T Weeks in Treatment: 1 Wound Status Wound Number: 1 Primary Venous Leg Ulcer Etiology: Wound Location: Left Lower Leg - Medial Wound Open Wounding Event: Not Known Status: Date Acquired: 01/09/2019 Comorbid Deep Vein Thrombosis, Hypertension, Weeks Of Treatment: 1 History: Osteoarthritis Clustered Wound: No Wound Measurements Length: (cm) 4.7 % Reduc Width: (cm) 2.7 % Reduc Depth: (cm) 0.1 Epithel Area: (cm) 9.967 Tunnel Volume: (cm) 0.997 Underm Wound Description Classification: Full Thickness Without Exposed Support Foul Od Structures Slough/ Wound Flat and Intact Margin: Exudate Medium Amount: Exudate Serosanguineous Type: Exudate red, brown Color: Wound Bed Granulation Amount: Large (67-100%) Granulation Quality: Red Fascia Necrotic Amount: None Present (0%) Fat Lay Tendon Muscle Joint E Bone Ex or After Cleansing: No Fibrino Yes Exposed Structure Exposed: No er (Subcutaneous Tissue) Exposed: Yes Exposed: No Exposed: No xposed: No posed: No tion in Area: 32.1% tion in Volume: 32.1% ialization: Small (1-33%) ing: No ining: No Treatment Notes Wound #1 (Left, Medial Lower Leg) 2. Periwound Care Moisturizing lotion TCA Cream 3. Primary Dressing Applied Iodoflex 4. Secondary Dressing Dry Gauze 6. Support Layer Applied 3 layer compression Water quality scientist) Signed: 03/06/2019 5:01:55 PM By: Baruch Gouty RN, BSN Entered By: Baruch Gouty on  03/06/2019 11:58:08 -------------------------------------------------------------------------------- [place] Details Patient Name: Date of Service: [name], [name] 03/06/2019 11:00 AM Medical Record [id number] Patient Account Number: [id number] Date of Birth/Sex: Treating RN: May 10, 1922 (86 y.o. [name] Primary Care Mineola Duan: Lajean Manes T Other Clinician: Referring Veda Arrellano: Treating Gethsemane Fischler/Extender:Robson, Secundino Ginger, Hal T Weeks in Treatment: 1 Vital Signs Time Taken: 11:44 Temperature (F): 97.7 Height (in): 63 Pulse (bpm): 76 Source: Stated Respiratory Rate (breaths/min): 18 Weight (lbs): 155 Blood Pressure (mmHg): 170/54 Source: Stated Reference Range: 80 - 120 mg / dl Body Mass Index (BMI): 27.5 Electronic Signature(s) Signed: 03/06/2019 5:01:55 PM By: Baruch Gouty RN, BSN Entered By: Baruch Gouty on 03/06/2019 11:48:56

## 2019-03-11 DIAGNOSIS — M25512 Pain in left shoulder: Secondary | ICD-10-CM | POA: Diagnosis not present

## 2019-03-13 ENCOUNTER — Ambulatory Visit: Payer: Medicare HMO | Admitting: Podiatry

## 2019-03-17 ENCOUNTER — Encounter (HOSPITAL_BASED_OUTPATIENT_CLINIC_OR_DEPARTMENT_OTHER): Payer: Medicare HMO | Attending: Internal Medicine | Admitting: Internal Medicine

## 2019-03-17 DIAGNOSIS — S8012XD Contusion of left lower leg, subsequent encounter: Secondary | ICD-10-CM | POA: Insufficient documentation

## 2019-03-17 DIAGNOSIS — I87312 Chronic venous hypertension (idiopathic) with ulcer of left lower extremity: Secondary | ICD-10-CM | POA: Diagnosis not present

## 2019-03-17 DIAGNOSIS — I890 Lymphedema, not elsewhere classified: Secondary | ICD-10-CM | POA: Diagnosis not present

## 2019-03-17 DIAGNOSIS — I872 Venous insufficiency (chronic) (peripheral): Secondary | ICD-10-CM | POA: Insufficient documentation

## 2019-03-17 DIAGNOSIS — I87322 Chronic venous hypertension (idiopathic) with inflammation of left lower extremity: Secondary | ICD-10-CM | POA: Insufficient documentation

## 2019-03-17 DIAGNOSIS — X58XXXD Exposure to other specified factors, subsequent encounter: Secondary | ICD-10-CM | POA: Diagnosis not present

## 2019-03-17 DIAGNOSIS — I10 Essential (primary) hypertension: Secondary | ICD-10-CM | POA: Diagnosis not present

## 2019-03-17 DIAGNOSIS — L97222 Non-pressure chronic ulcer of left calf with fat layer exposed: Secondary | ICD-10-CM | POA: Diagnosis not present

## 2019-03-18 NOTE — Progress Notes (Signed)
Rachael, [name] ([id number]) Visit Report for 03/17/2019 HPI Details Patient Name: Date of Service: Rachael, [name] 03/17/2019 11:00 AM Medical Record [id number] Patient Account Number: [id number] Date of Birth/Sex: Treating RN: [birth year] (86 y.o. Orvan Falconer Primary Care Provider: Lajean Manes T Other Clinician: Referring Provider: Treating Provider/Extender:Zelie Asbill, Secundino Ginger, Hal T Weeks in Treatment: 3 History of Present Illness HPI Description: ADMISSION 02/24/2019 This is a pleasant 86 year old woman who lives in the independent section of the [facility] complex. She apparently suffered a hematoma of her left medial lower leg about a month ago. She is not really sure how this happened however by her description this was clearly a hematoma that then has since evacuated leaving her with a nonhealing wound in this area. She saw her primary physician Dr. Felipa Eth had Keflex for 10 days and then subsequently Augmentin which I think she may still be on. She had Telfa and then Xeroform cover on this on arrival to our clinic. Past medical history includes a left hip fracture in October 2020, osteoporosis, hypertension and mitral regurgitation. Her ABI in our clinic was 1.29 on the left 2/26; the patient's wound is on the left medial lower leg. Extensive debridement last week. We have been using Iodoflex. Much better 3/9; 2-week follow-up. The patient has a traumatic wound on the left medial lower leg in the setting of severe chronic venous insufficiency. The feeling is that this was initially hematoma. We have been using Iodoflex under compression she appears to be making nice progress Electronic Signature(s) Signed: 03/17/2019 5:48:31 PM By: Linton Ham MD Entered By: Linton Ham on 03/17/2019 13:03:09 -------------------------------------------------------------------------------- Physical Exam Details Patient Name: Date of Service: Rachael, [name] 03/17/2019 11:00 AM Medical Record [id number] Patient Account Number: [id number] Date of Birth/Sex: Treating RN: [birth year] (86 y.o. Orvan Falconer Primary Care Provider: Lajean Manes T Other Clinician: Referring Provider: Treating Provider/Extender:Tana Trefry, Secundino Ginger, Hal T Weeks in Treatment: 3 Cardiovascular It will pulses are palpable. Edema is controlled with our compression. Integumentary (Hair, Skin) No evidence of infection around the wound. Notes Wound exam; the area on the left medial calf. Much better looking. No debridement is necessary. The wound is being split into 2 by advancing epithelialization there is no evidence of surrounding infection Electronic Signature(s) Signed: 03/17/2019 5:48:31 PM By: Linton Ham MD Entered By: Linton Ham on 03/17/2019 13:03:56 -------------------------------------------------------------------------------- Physician Orders Details Patient Name: Date of Service: Rachael, [name] 03/17/2019 11:00 AM Medical Record [id number] Patient Account Number: [id number] Date of Birth/Sex: Treating RN: 1922-09-20 (86 y.o. Orvan Falconer Primary Care Provider: Lajean Manes T Other Clinician: Referring Provider: Treating Provider/Extender:Mathius Birkeland, Secundino Ginger, Hal T Weeks in Treatment: 3 Verbal / Phone Orders: No Diagnosis Coding ICD-10 Coding Code Description S80.12XD Contusion of left lower leg, subsequent encounter I87.322 Chronic venous hypertension (idiopathic) with inflammation of left lower extremity I89.0 Lymphedema, not elsewhere classified Follow-up Appointments Return Appointment in 2 weeks. - Tuesdays Dressing Change Frequency Other: - 2 times per week, [name] Stone to change on Fridays, please fax to [place] at [phone], phone # [phone] wound center to change on tuesday Skin Barriers/Peri-Wound Care Wound #1 Left,Medial Lower Leg TCA Cream or Ointment Wound Cleansing May  shower with protection. Primary Wound Dressing Wound #1 Left,Medial Lower Leg Iodoflex Secondary Dressing Wound #1 Left,Medial Lower Leg Dry Gauze ABD pad Edema Control 3 Layer Compression System - Left Lower Extremity Electronic Signature(s) Signed: 03/17/2019 5:48:31 PM By: Linton Ham MD Signed: 03/18/2019 7:20:02 AM By: Carlene Coria RN Entered By: Carlene Coria on  03/17/2019 11:20:20 -------------------------------------------------------------------------------- Problem List Details Patient Name: Date of Service: Rachael, [name] 03/17/2019 11:00 AM Medical Record [id number] Patient Account Number: [id number] Date of Birth/Sex: Treating RN: 08/23/1922 (86 y.o. Orvan Falconer Primary Care Provider: Lajean Manes T Other Clinician: Referring Provider: Treating Provider/Extender:Hillary Struss, Secundino Ginger, Hal T Weeks in Treatment: 3 Active Problems ICD-10 Evaluated Encounter Code Description Active Date Today Diagnosis S80.12XD Contusion of left lower leg, subsequent encounter 02/24/2019 No Yes I87.322 Chronic venous hypertension (idiopathic) with 02/24/2019 No Yes inflammation of left lower extremity I89.0 Lymphedema, not elsewhere classified 02/24/2019 No Yes Inactive Problems Resolved Problems Electronic Signature(s) Signed: 03/17/2019 5:48:31 PM By: Linton Ham MD Entered By: Linton Ham on 03/17/2019 13:02:30 -------------------------------------------------------------------------------- Progress Note Details Patient Name: Date of Service: Rachael, [name] 03/17/2019 11:00 AM Medical Record [id number] Patient Account Number: [id number] Date of Birth/Sex: Treating RN: 1922/09/27 (86 y.o. Orvan Falconer Primary Care Provider: Lajean Manes T Other Clinician: Referring Provider: Treating Provider/Extender:Sheray Grist, Secundino Ginger, Hal T Weeks in Treatment: 3 Subjective History of Present Illness (HPI) ADMISSION 02/24/2019 This is a pleasant  86 year old woman who lives in the independent section of the [facility] complex. She apparently suffered a hematoma of her left medial lower leg about a month ago. She is not really sure how this happened however by her description this was clearly a hematoma that then has since evacuated leaving her with a nonhealing wound in this area. She saw her primary physician Dr. Felipa Eth had Keflex for 10 days and then subsequently Augmentin which I think she may still be on. She had Telfa and then Xeroform cover on this on arrival to our clinic. Past medical history includes a left hip fracture in October 2020, osteoporosis, hypertension and mitral regurgitation. Her ABI in our clinic was 1.29 on the left 2/26; the patient's wound is on the left medial lower leg. Extensive debridement last week. We have been using Iodoflex. Much better 3/9; 2-week follow-up. The patient has a traumatic wound on the left medial lower leg in the setting of severe chronic venous insufficiency. The feeling is that this was initially hematoma. We have been using Iodoflex under compression she appears to be making nice progress Objective Constitutional Vitals Time Taken: 11:52 AM, Height: 63 in, Weight: 155 lbs, BMI: 27.5, Temperature: 97.9 F, Pulse: 79 bpm, Respiratory Rate: 18 breaths/min, Blood Pressure: 133/79 mmHg. Cardiovascular It will pulses are palpable. Edema is controlled with our compression. General Notes: Wound exam; the area on the left medial calf. Much better looking. No debridement is necessary. The wound is being split into 2 by advancing epithelialization there is no evidence of surrounding infection Integumentary (Hair, Skin) No evidence of infection around the wound. Wound #1 status is Open. Original cause of wound was Not Known. The wound is located on the Left,Medial Lower Leg. The wound measures 3.5cm length x 1.5cm width x 0.1cm depth; 4.123cm^2 area and 0.412cm^3 volume. There is Fat  Layer (Subcutaneous Tissue) Exposed exposed. There is no tunneling or undermining noted. There is a medium amount of serosanguineous drainage noted. The wound margin is flat and intact. There is large (67-100%) red granulation within the wound bed. There is no necrotic tissue within the wound bed. Assessment Active Problems ICD-10 Contusion of left lower leg, subsequent encounter Chronic venous hypertension (idiopathic) with inflammation of left lower extremity Lymphedema, not elsewhere classified Procedures Wound #1 Pre-procedure diagnosis of Wound #1 is a Venous Leg Ulcer located on the Left,Medial Lower Leg . There was a Three Layer Compression Therapy  Procedure by Carlene Coria, RN. Post procedure Diagnosis Wound #1: Same as Pre-Procedure Plan Follow-up Appointments: Return Appointment in 2 weeks. - Tuesdays Dressing Change Frequency: Other: - 2 times per week, [name] Stone to change on Fridays, please fax to [place] at [phone], phone # [phone] wound center to change on tuesday Skin Barriers/Peri-Wound Care: Wound #1 Left,Medial Lower Leg: TCA Cream or Ointment Wound Cleansing: May shower with protection. Primary Wound Dressing: Wound #1 Left,Medial Lower Leg: Iodoflex Secondary Dressing: Wound #1 Left,Medial Lower Leg: Dry Gauze ABD pad Edema Control: 3 Layer Compression System - Left Lower Extremity 1. I am going to continue with the Iodoflex she seems to be doing very well with this. Almost surprisingly well considering the amount of debris on the wound surface when she first came here. 2. She asked me to not wrap her leg although I advised against that given the degree of chronic venous insufficiency she has. Risk of lymphedema could cause regression of the wound therefore she was put back in 3 layer compression. Staff at the [facility] complex is changing this Engineer, maintenance) Signed: 03/17/2019 5:48:31 PM By: Linton Ham MD Entered By: Linton Ham on 03/17/2019 13:04:57 -------------------------------------------------------------------------------- SuperBill Details Patient Name: Date of Service: AIMAR, [place] 03/17/2019 Medical Record [id number] Patient Account Number: [id number] Date of Birth/Sex: Treating RN: [birth year] (86 y.o. Orvan Falconer Primary Care Provider: Lajean Manes T Other Clinician: Referring Provider: Treating Provider/Extender:Reynold Mantell, Secundino Ginger, Hal T Weeks in Treatment: 3 Diagnosis Coding ICD-10 Codes Code Description S80.12XD Contusion of left lower leg, subsequent encounter I87.322 Chronic venous hypertension (idiopathic) with inflammation of left lower extremity I89.0 Lymphedema, not elsewhere classified Facility Procedures CPT4 Code Description: [id number] (Facility Use Only) [phone] - [place] LWR LT LEG Modifier: Quantity: 1 Physician Procedures CPT4 Code Description: [id number] - WC PHYS LEVEL 3 - EST PT ICD-10 Diagnosis Description S80.12XD Contusion of left lower leg, subsequent encounter I87.322 Chronic venous hypertension (idiopathic) with inflammation extremity I89.0 Lymphedema, not  elsewhere classified Modifier: of left lower Quantity: 1 Electronic Signature(s) Signed: 03/17/2019 5:48:31 PM By: Linton Ham MD Entered By: Linton Ham on 03/17/2019 13:05:16

## 2019-03-31 ENCOUNTER — Other Ambulatory Visit: Payer: Self-pay

## 2019-03-31 ENCOUNTER — Encounter (HOSPITAL_BASED_OUTPATIENT_CLINIC_OR_DEPARTMENT_OTHER): Payer: Medicare HMO | Admitting: Internal Medicine

## 2019-03-31 DIAGNOSIS — I10 Essential (primary) hypertension: Secondary | ICD-10-CM | POA: Diagnosis not present

## 2019-03-31 DIAGNOSIS — S8012XD Contusion of left lower leg, subsequent encounter: Secondary | ICD-10-CM | POA: Diagnosis not present

## 2019-03-31 DIAGNOSIS — I872 Venous insufficiency (chronic) (peripheral): Secondary | ICD-10-CM | POA: Diagnosis not present

## 2019-03-31 DIAGNOSIS — I890 Lymphedema, not elsewhere classified: Secondary | ICD-10-CM | POA: Diagnosis not present

## 2019-03-31 DIAGNOSIS — I87322 Chronic venous hypertension (idiopathic) with inflammation of left lower extremity: Secondary | ICD-10-CM | POA: Diagnosis not present

## 2019-03-31 DIAGNOSIS — L97829 Non-pressure chronic ulcer of other part of left lower leg with unspecified severity: Secondary | ICD-10-CM | POA: Diagnosis not present

## 2019-03-31 NOTE — Progress Notes (Signed)
[name], [name] ([id number]) Visit Report for 03/31/2019 HPI Details Patient Name: Date of Service: [name], [name] 03/31/2019 10:00 AM Medical Record [id number] Patient Account Number: [id number] Date of Birth/Sex: Treating RN: [birth year] (86 y.o. Orvan Falconer Primary Care Provider: Lajean Manes T Other Clinician: Referring Provider: Treating Provider/Extender:Autumm Hattery, [name], [name]: 5 History of Present Illness HPI Description: ADMISSION 02/24/2019 This is a pleasant 86 year old woman who lives in the independent section of the [facility] complex. She apparently suffered a hematoma of her left medial lower leg about a month ago. She is not really sure how this happened however by her description this was clearly a hematoma that then has since evacuated leaving her with a nonhealing wound in this area. She saw her primary physician Dr. Felipa Eth had Keflex for 10 days and then subsequently Augmentin which I think she may still be on. She had Telfa and then Xeroform cover on this on arrival to our clinic. Past medical history includes a left hip fracture in October 2020, osteoporosis, hypertension and mitral regurgitation. Her ABI in our clinic was 1.29 on the left 2/26; the patient's wound is on the left medial lower leg. Extensive debridement last week. We have been using Iodoflex. Much better 3/9; 2-week follow-up. The patient has a traumatic wound on the left medial lower leg in the setting of severe chronic venous insufficiency. The feeling is that this was initially hematoma. We have been using Iodoflex under compression she appears to be making nice progress 3/23; 2-week follow-up. The patient had a traumatic wound on the left medial lower leg in the setting of venous insufficiency the feeling that this was initially a hematoma. We used Iodoflex and she made almost surprisingly good progress. In fact today she is healed. She will  need some form of support stocking at home hopefully this holds things together otherwise she may need more aggressive compression stocking Electronic Signature(s) Signed: 03/31/2019 5:53:09 PM By: Linton Ham MD Entered By: Linton Ham on 03/31/2019 10:35:49 -------------------------------------------------------------------------------- Physical Exam Details Patient Name: Date of Service: [name], [name] 03/31/2019 10:00 AM Medical Record [id number] Patient Account Number: [id number] Date of Birth/Sex: Treating RN: 10/20/1922 (86 y.o. Orvan Falconer Primary Care Provider: Lajean Manes T Other Clinician: Referring Provider: Treating Provider/Extender:Niels Cranshaw, [name], [name]: 5 Constitutional Sitting or standing Blood Pressure is within target range for patient.. Pulse regular and within target range for patient.Marland Kitchen Respirations regular, non-labored and within target range.. Temperature is normal and within the target range for the patient.Marland Kitchen Appears in no distress. Cardiovascular Pedal pulses are palpable. Notes Wound exam; the areas on the left medial lower calf. No open areas seen. She has chronic stasis dermatitis changes and hemosiderin deposition. Frail skin however there is no open wound. Electronic Signature(s) Signed: 03/31/2019 5:53:09 PM By: Linton Ham MD Entered By: Linton Ham on 03/31/2019 10:36:39 -------------------------------------------------------------------------------- Physician Orders Details Patient Name: Date of Service: [name], [name] 03/31/2019 10:00 AM Medical Record [id number] Patient Account Number: [id number] Date of Birth/Sex: Treating RN: [birth year] (86 y.o. Orvan Falconer Primary Care Provider: Lajean Manes T Other Clinician: Referring Provider: Treating Provider/Extender:Paizlie Klaus, [name], [name]: 5 Verbal / Phone Orders: No Diagnosis Coding ICD-10  Coding Code Description S80.12XD Contusion of left lower leg, subsequent encounter I87.322 Chronic venous hypertension (idiopathic) with inflammation of left lower extremity I89.0 Lymphedema, not elsewhere classified Discharge From [facility] Discharge from [place] Edema Control Support Garment 10-20 mm/Hg pressure to: - on  in the am off in the pm , both legs, lotion to legs ever evening Electronic Signature(s) Signed: 03/31/2019 5:53:09 PM By: Linton Ham MD Signed: 03/31/2019 5:59:23 PM By: Carlene Coria RN Entered By: Carlene Coria on 03/31/2019 10:16:04 -------------------------------------------------------------------------------- Problem List Details Patient Name: Date of Service: [name], [name] 03/31/2019 10:00 AM Medical Record [id number] Patient Account Number: [id number] Date of Birth/Sex: Treating RN: 1923-01-05 (86 y.o. Orvan Falconer Primary Care Provider: Lajean Manes T Other Clinician: Referring Provider: Treating Provider/Extender:[name], [name], [name]: 5 Active Problems ICD-10 Evaluated Encounter Code Description Active Date Today Diagnosis S80.12XD Contusion of left lower leg, subsequent encounter 02/24/2019 No Yes I87.322 Chronic venous hypertension (idiopathic) with 02/24/2019 No Yes inflammation of left lower extremity I89.0 Lymphedema, not elsewhere classified 02/24/2019 No Yes Inactive Problems Resolved Problems Electronic Signature(s) Signed: 03/31/2019 5:53:09 PM By: Linton Ham MD Entered By: Linton Ham on 03/31/2019 10:34:41 -------------------------------------------------------------------------------- Progress Note Details Patient Name: Date of Service: [name], [name] 03/31/2019 10:00 AM Medical Record [id number] Patient Account Number: [id number] Date of Birth/Sex: Treating RN: [birth year] (86 y.o. Orvan Falconer Primary Care Provider: Lajean Manes T Other  Clinician: Referring Provider: Treating Provider/Extender:[name], [name], [name]: 5 Subjective History of Present Illness (HPI) ADMISSION 02/24/2019 This is a pleasant 86 year old woman who lives in the independent section of the [facility] complex. She apparently suffered a hematoma of her left medial lower leg about a month ago. She is not really sure how this happened however by her description this was clearly a hematoma that then has since evacuated leaving her with a nonhealing wound in this area. She saw her primary physician Dr. Felipa Eth had Keflex for 10 days and then subsequently Augmentin which I think she may still be on. She had Telfa and then Xeroform cover on this on arrival to our clinic. Past medical history includes a left hip fracture in October 2020, osteoporosis, hypertension and mitral regurgitation. Her ABI in our clinic was 1.29 on the left 2/26; the patient's wound is on the left medial lower leg. Extensive debridement last week. We have been using Iodoflex. Much better 3/9; 2-week follow-up. The patient has a traumatic wound on the left medial lower leg in the setting of severe chronic venous insufficiency. The feeling is that this was initially hematoma. We have been using Iodoflex under compression she appears to be making nice progress 3/23; 2-week follow-up. The patient had a traumatic wound on the left medial lower leg in the setting of venous insufficiency the feeling that this was initially a hematoma. We used Iodoflex and she made almost surprisingly good progress. In fact today she is healed. She will need some form of support stocking at home hopefully this holds things together otherwise she may need more aggressive compression stocking Objective Constitutional Sitting or standing Blood Pressure is within target range for patient.. Pulse regular and within target range for patient.Marland Kitchen Respirations regular, non-labored  and within target range.. Temperature is normal and within the target range for the patient.Marland Kitchen Appears in no distress. Vitals Time Taken: 9:51 AM, Height: 63 in, Weight: 155 lbs, BMI: 27.5, Temperature: 97.6 F, Pulse: 74 bpm, Respiratory Rate: 18 breaths/min, Blood Pressure: 126/56 mmHg. Cardiovascular Pedal pulses are palpable. General Notes: Wound exam; the areas on the left medial lower calf. No open areas seen. She has chronic stasis dermatitis changes and hemosiderin deposition. Frail skin however there is no open wound. Integumentary (Hair, Skin) Wound #1 status is Healed - Epithelialized. Original  cause of wound was Not Known. The wound is located on the Left,Medial Lower Leg. The wound measures 0cm length x 0cm width x 0cm depth; 0cm^2 area and 0cm^3 volume. There is no tunneling or undermining noted. There is a none present amount of drainage noted. The wound margin is flat and intact. There is no granulation within the wound bed. There is no necrotic tissue within the wound bed. Assessment Active Problems ICD-10 Contusion of left lower leg, subsequent encounter Chronic venous hypertension (idiopathic) with inflammation of left lower extremity Lymphedema, not elsewhere classified Plan Discharge From [facility]: Discharge from [place] Edema Control: Support Garment 10-20 mm/Hg pressure to: - on in the am off in the pm , both legs, lotion to legs ever evening 1. The patient can be discharged from the clinic 2. I recommended support hose for now 10 to 15 mm daily. Hopefully that will be sufficient. Electronic Signature(s) Signed: 03/31/2019 5:53:09 PM By: Linton Ham MD Entered By: Linton Ham on 03/31/2019 10:37:26 -------------------------------------------------------------------------------- SuperBill Details Patient Name: Date of Service: [name], [name] 03/31/2019 Medical Record [id number] Patient Account Number: [id number] Date of  Birth/Sex: Treating RN: [birth year] (86 y.o. Orvan Falconer Primary Care Provider: Lajean Manes T Other Clinician: Referring Provider: Treating Provider/Extender:Hadiyah Maricle, [name], [name]: 5 Diagnosis Coding ICD-10 Codes Code Description S80.12XD Contusion of left lower leg, subsequent encounter I87.322 Chronic venous hypertension (idiopathic) with inflammation of left lower extremity I89.0 Lymphedema, not elsewhere classified Facility Procedures Electronic Signature(s) Signed: 03/31/2019 5:53:09 PM By: Linton Ham MD Signed: 03/31/2019 5:59:23 PM By: Carlene Coria RN Entered By: Carlene Coria on 03/31/2019 10:37:58

## 2019-04-22 NOTE — Progress Notes (Signed)
[name], [name] [name] ([id number]) Visit Report for 03/31/2019 Arrival Information Details Patient Name: Date of Service: [name] [name] 03/31/2019 10:00 AM Medical Record [id number] Patient Account Number: [id number] Date of Birth/Sex: Treating RN: 1922-01-17 (86 y.o. [name] [name] Primary Care Robecca Fulgham: Lajean Manes T Other Clinician: Referring Deadra Diggins: Treating Nolene Rocks/Extender:Robson, Secundino Ginger, Hal T Weeks in Treatment: 5 Visit Information History Since Last Visit Added or deleted any medications: No Patient Arrived: Wheel Chair Any new allergies or adverse reactions: No Arrival Time: 09:50 Had a fall or experienced change in No activities of daily living that may affect Accompanied By: caregiver risk of falls: Transfer Assistance: Manual Signs or symptoms of abuse/neglect since last No Patient Identification Verified: Yes visito Secondary Verification Process Completed: Yes Hospitalized since last visit: No Patient Requires Transmission-Based No Implantable device outside of the clinic excluding No Precautions: cellular tissue based products placed in the center Patient Has Alerts: No since last visit: Has Dressing in Place as Prescribed: Yes Pain Present Now: No Electronic Signature(s) Signed: 04/22/2019 9:21:08 AM By: Sandre Kitty Entered By: Sandre Kitty on 03/31/2019 09:50:28 -------------------------------------------------------------------------------- Clinic Level of Care Assessment Details Patient Name: Date of Service: [name] [name] 03/31/2019 10:00 AM Medical Record [id number] Patient Account Number: [id number] Date of Birth/Sex: Treating RN: [birth year] (86 y.o. [name] [name] Primary Care Corry Ihnen: Lajean Manes T Other Clinician: Referring Harumi Yamin: Treating Joe Tanney/Extender:Robson, Secundino Ginger, Hal T Weeks in Treatment: 5 Clinic Level of Care Assessment Items TOOL 4 Quantity Score X - Use when only an  EandM is performed on FOLLOW-UP visit 1 0 ASSESSMENTS - Nursing Assessment / Reassessment X - Reassessment of Co-morbidities (includes updates in patient status) 1 10 X - Reassessment of Adherence to Treatment Plan 1 5 ASSESSMENTS - Wound and Skin Assessment / Reassessment X - Simple Wound Assessment / Reassessment - one wound 1 5 []  - Complex Wound Assessment / Reassessment - multiple wounds 0 []  - Dermatologic / Skin Assessment (not related to wound area) 0 ASSESSMENTS - Focused Assessment []  - Circumferential Edema Measurements - multi extremities 0 []  - Nutritional Assessment / Counseling / Intervention 0 []  - Lower Extremity Assessment (monofilament, tuning fork, pulses) 0 []  - Peripheral Arterial Disease Assessment (using hand held doppler) 0 ASSESSMENTS - Ostomy and/or Continence Assessment and Care []  - Incontinence Assessment and Management 0 []  - Ostomy Care Assessment and Management (repouching, etc.) 0 PROCESS - Coordination of Care X - Simple Patient / Family Education for ongoing care 1 15 []  - Complex (extensive) Patient / Family Education for ongoing care 0 X - Staff obtains Programmer, systems, Records, Test Results / Process Orders 1 10 []  - Staff telephones HHA, Nursing Homes / Clarify orders / etc 0 []  - Routine Transfer to another Facility (non-emergent condition) 0 []  - Routine Hospital Admission (non-emergent condition) 0 []  - New Admissions / Biomedical engineer / Ordering NPWT, Apligraf, etc. 0 []  - Emergency Hospital Admission (emergent condition) 0 X - Simple Discharge Coordination 1 10 []  - Complex (extensive) Discharge Coordination 0 PROCESS - Special Needs []  - Pediatric / Minor Patient Management 0 []  - Isolation Patient Management 0 []  - Hearing / Language / Visual special needs 0 []  - Assessment of Community assistance (transportation, D/C planning, etc.) 0 []  - Additional assistance / Altered mentation 0 []  - Support Surface(s) Assessment (bed, cushion,  seat, etc.) 0 INTERVENTIONS - Wound Cleansing / Measurement X - Simple Wound Cleansing - one wound 1 5 []  - Complex Wound Cleansing - multiple wounds 0 X - Wound Imaging (  photographs - any number of wounds) 1 5 []  - Wound Tracing (instead of photographs) 0 X - Simple Wound Measurement - one wound 1 5 []  - Complex Wound Measurement - multiple wounds 0 INTERVENTIONS - Wound Dressings []  - Small Wound Dressing one or multiple wounds 0 []  - Medium Wound Dressing one or multiple wounds 0 []  - Large Wound Dressing one or multiple wounds 0 []  - Application of Medications - topical 0 []  - Application of Medications - injection 0 INTERVENTIONS - Miscellaneous []  - External ear exam 0 []  - Specimen Collection (cultures, biopsies, blood, body fluids, etc.) 0 []  - Specimen(s) / Culture(s) sent or taken to Lab for analysis 0 []  - Patient Transfer (multiple staff / Civil Service fast streamer / Similar devices) 0 []  - Simple Staple / Suture removal (25 or less) 0 []  - Complex Staple / Suture removal (26 or more) 0 []  - Hypo / Hyperglycemic Management (close monitor of Blood Glucose) 0 []  - Ankle / Brachial Index (ABI) - do not check if billed separately 0 X - Vital Signs 1 5 Has the patient been seen at the hospital within the last three years: Yes Total Score: 75 Level Of Care: New/Established - Level 2 Electronic Signature(s) Signed: 03/31/2019 5:59:23 PM By: Carlene Coria RN Entered By: Carlene Coria on 03/31/2019 10:37:32 -------------------------------------------------------------------------------- Encounter Discharge Information Details Patient Name: Date of Service: [name], [name] 03/31/2019 10:00 AM Medical Record [id number] Patient Account Number: [id number] Date of Birth/Sex: Treating RN: [birth year] (86 y.o. [name] [name] Primary Care Anah Billard: Lajean Manes T Other Clinician: Referring Desa Rech: Treating Shavawn Stobaugh/Extender:Robson, Secundino Ginger, Junius Creamer in Treatment:  5 Encounter Discharge Information Items Discharge Condition: Stable Ambulatory Status: Wheelchair Discharge Destination: Home Transportation: Private Auto Accompanied By: caregiver Schedule Follow-up Appointment: No Clinical Summary of Care: Patient Declined Electronic Signature(s) Signed: 03/31/2019 5:59:23 PM By: Carlene Coria RN Entered By: Carlene Coria on 03/31/2019 10:39:29 -------------------------------------------------------------------------------- Lower Extremity Assessment Details Patient Name: Date of Service: [name], [name] 03/31/2019 10:00 AM Medical Record [id number] Patient Account Number: [id number] Date of Birth/Sex: Treating RN: Nov 02, 1922 (86 y.o. Nancy Fetter Primary Care Janyra Barillas: Lajean Manes T Other Clinician: Referring Lakechia Nay: Treating Sheneika Walstad/Extender:Robson, Secundino Ginger, Hal T Weeks in Treatment: 5 Edema Assessment Assessed: [Left: No] [Right: No] Edema: [Left: Ye] [Right: s] Calf Left: Right: Point of Measurement: 30 cm From Medial Instep 28 cm cm Ankle Left: Right: Point of Measurement: 11 cm From Medial Instep 18.5 cm cm Vascular Assessment Pulses: Dorsalis Pedis Palpable: [Left:Yes] Electronic Signature(s) Signed: 04/22/2019 9:02:47 AM By: Levan Hurst RN, BSN Entered By: Levan Hurst on 03/31/2019 10:03:08 -------------------------------------------------------------------------------- Multi Wound Chart Details Patient Name: Date of Service: [name], [name] 03/31/2019 10:00 AM Medical Record [id number] Patient Account Number: [id number] Date of Birth/Sex: Treating RN: [birth year] (86 y.o. [name] [name] Primary Care Caydin Yeatts: Lajean Manes T Other Clinician: Referring Marris Frontera: Treating Don Giarrusso/Extender:Robson, Secundino Ginger, Hal T Weeks in Treatment: 5 Vital Signs Height(in): 63 Pulse(bpm): 74 Weight(lbs): 155 Blood Pressure(mmHg): 126/56 Body Mass Index(BMI): 27 Temperature(F):  97.6 Respiratory 18 Rate(breaths/min): Photos: [1:No Photos] [N/A:N/A] Wound Location: [1:Left Lower Leg - Medial] [N/A:N/A] Wounding Event: [1:Not Known] [N/A:N/A] Primary Etiology: [1:Venous Leg Ulcer] [N/A:N/A] Comorbid History: [1:Deep Vein Thrombosis, Hypertension, Osteoarthritis] [N/A:N/A] Date Acquired: [1:01/09/2019] [N/A:N/A] Weeks of Treatment: [1:5] [N/A:N/A] Wound Status: [1:Healed - Epithelialized] [N/A:N/A] Measurements L x W x D 0x0x0 [N/A:N/A] (cm) Area (cm) : [1:0] [N/A:N/A] Volume (cm) : [1:0] [N/A:N/A] % Reduction in Area: [1:100.00%] [N/A:N/A] % Reduction in Volume: 100.00% [N/A:N/A] Classification: [1:Full Thickness Without  Exposed Support Structures] [N/A:N/A] Exudate Amount: [1:None Present] [N/A:N/A] Wound Margin: [1:Flat and Intact] [N/A:N/A] Granulation Amount: [1:None Present (0%)] [N/A:N/A] Necrotic Amount: [1:None Present (0%)] [N/A:N/A] Exposed Structures: [1:Fascia: No Fat Layer (Subcutaneous Tissue) Exposed: No Tendon: No Muscle: No Joint: No Bone: No Large (67-100%)] [N/A:N/A N/A] Treatment Notes Electronic Signature(s) Signed: 03/31/2019 5:53:09 PM By: Linton Ham MD Signed: 03/31/2019 5:59:23 PM By: Carlene Coria RN Entered By: Linton Ham on 03/31/2019 10:34:51 -------------------------------------------------------------------------------- Multi-Disciplinary Care Plan Details Patient Name: Date of Service: [name], [name] 03/31/2019 10:00 AM Medical Record [id number] Patient Account Number: [id number] Date of Birth/Sex: Treating RN: 02/15/1922 (86 y.o. [name] [name] Primary Care Deshayla Empson: Lajean Manes T Other Clinician: Referring Ruberta Holck: Treating Mckaylie Vasey/Extender:Robson, Secundino Ginger, Hal T Weeks in Treatment: 5 Active Inactive Electronic Signature(s) Signed: 03/31/2019 5:59:23 PM By: Carlene Coria RN Entered By: Carlene Coria on 03/31/2019  10:18:27 -------------------------------------------------------------------------------- Pain Assessment Details Patient Name: Date of Service: [name], [name] 03/31/2019 10:00 AM Medical Record [id number] Patient Account Number: [id number] Date of Birth/Sex: Treating RN: 08/01/1922 (86 y.o. [name] [name] Primary Care Jovanie Verge: Lajean Manes T Other Clinician: Referring Finlee Concepcion: Treating Schneider Warchol/Extender:Robson, Secundino Ginger, Hal T Weeks in Treatment: 5 Active Problems Location of Pain Severity and Description of Pain Patient Has Paino No Site Locations Pain Management and Medication Current Pain Management: Electronic Signature(s) Signed: 03/31/2019 5:59:23 PM By: Carlene Coria RN Signed: 04/22/2019 9:21:08 AM By: Sandre Kitty Entered By: Sandre Kitty on 03/31/2019 09:52:12 -------------------------------------------------------------------------------- Patient/Caregiver Education Details Patient Name: Date of Service: [name] [name] 3/23/2021andnbsp10:00 AM Medical Record [id number] Patient Account Number: [id number] Date of Birth/Gender: Treating RN: 1922-10-09 (86 y.o. [name] [name] Primary Care Physician: Lajean Manes T Other Clinician: Referring Physician: Treating Physician/Extender:Robson, Secundino Ginger, Junius Creamer in Treatment: 5 Education Assessment Education Provided To: Patient Education Topics Provided Wound/Skin Impairment: Methods: Explain/Verbal Responses: State content correctly Electronic Signature(s) Signed: 03/31/2019 5:59:23 PM By: Carlene Coria RN Entered By: Carlene Coria on 03/31/2019 10:07:30 -------------------------------------------------------------------------------- Wound Assessment Details Patient Name: Date of Service: [name], [name] 03/31/2019 10:00 AM Medical Record [id number] Patient Account Number: [id number] Date of Birth/Sex: Treating RN: 1922-09-28 (86 y.o. [name] [name] Primary  Care Bentleigh Stankus: Lajean Manes T Other Clinician: Referring Olivette Beckmann: Treating Josetta Wigal/Extender:Robson, Secundino Ginger, Hal T Weeks in Treatment: 5 Wound Status Wound Number: 1 Primary Venous Leg Ulcer Etiology: Wound Location: Left Lower Leg - Medial Wound Healed - Epithelialized Wounding Event: Not Known Status: Date Acquired: 01/09/2019 Comorbid Deep Vein Thrombosis, Hypertension, Weeks Of Treatment: 5 History: Osteoarthritis Clustered Wound: No Photos Photo Uploaded By: Mikeal Hawthorne on 04/02/2019 14:05:44 Wound Measurements Length: (cm) 0 % Reduct Width: (cm) 0 % Reduct Depth: (cm) 0 Epitheli Area: (cm) 0 Tunneli Volume: (cm) 0 Undermi Wound Description Full Thickness Without Exposed Support Classification: Structures Wound Flat and Intact Margin: Exudate None Present Amount: Wound Bed Granulation Amount: None Present (0%) Necrotic Amount: None Present (0%) Foul Odor After Cleansing: No Slough/Fibrino No Exposed Structure Fascia Exposed: No Fat Layer (Subcutaneous Tissue) Exposed: No Tendon Exposed: No Muscle Exposed: No Joint Exposed: No Bone Exposed: No ion in Area: 100% ion in Volume: 100% alization: Large (67-100%) ng: No ning: No Electronic Signature(s) Signed: 03/31/2019 5:59:23 PM By: Carlene Coria RN Signed: 04/22/2019 9:02:47 AM By: Levan Hurst RN, BSN Entered By: Levan Hurst on 03/31/2019 10:02:35 -------------------------------------------------------------------------------- [place] Details Patient Name: Date of Service: [name], [name] 03/31/2019 10:00 AM Medical Record [id number] Patient Account Number: [id number] Date of Birth/Sex: Treating RN: January 27, 1922 (86 y.o. [name] [name] Primary Care Syd Manges: Patrina Levering  Other Clinician: Referring Cystal Shannahan: Treating Keefe Zawistowski/Extender:Robson, Secundino Ginger, Hal T Weeks in Treatment: 5 Vital Signs Time Taken: 09:51 Temperature (F): 97.6 Height (in): 63 Pulse  (bpm): 74 Weight (lbs): 155 Respiratory Rate (breaths/min): 18 Body Mass Index (BMI): 27.5 Blood Pressure (mmHg): 126/56 Reference Range: 80 - 120 mg / dl Electronic Signature(s) Signed: 04/22/2019 9:21:08 AM By: Sandre Kitty Entered By: Sandre Kitty on 03/31/2019 09:52:06

## 2019-05-07 DIAGNOSIS — M8580 Other specified disorders of bone density and structure, unspecified site: Secondary | ICD-10-CM | POA: Diagnosis not present

## 2019-05-07 DIAGNOSIS — M810 Age-related osteoporosis without current pathological fracture: Secondary | ICD-10-CM | POA: Diagnosis not present

## 2019-05-07 DIAGNOSIS — M150 Primary generalized (osteo)arthritis: Secondary | ICD-10-CM | POA: Diagnosis not present

## 2019-05-07 DIAGNOSIS — I10 Essential (primary) hypertension: Secondary | ICD-10-CM | POA: Diagnosis not present

## 2019-06-03 ENCOUNTER — Ambulatory Visit: Payer: Medicare HMO | Admitting: Podiatry

## 2019-06-03 ENCOUNTER — Encounter: Payer: Self-pay | Admitting: Podiatry

## 2019-06-03 ENCOUNTER — Other Ambulatory Visit: Payer: Self-pay

## 2019-06-03 DIAGNOSIS — M79674 Pain in right toe(s): Secondary | ICD-10-CM | POA: Diagnosis not present

## 2019-06-03 DIAGNOSIS — B351 Tinea unguium: Secondary | ICD-10-CM | POA: Diagnosis not present

## 2019-06-03 DIAGNOSIS — M79675 Pain in left toe(s): Secondary | ICD-10-CM

## 2019-06-03 NOTE — Patient Instructions (Signed)

## 2019-06-05 DIAGNOSIS — M8580 Other specified disorders of bone density and structure, unspecified site: Secondary | ICD-10-CM | POA: Diagnosis not present

## 2019-06-05 DIAGNOSIS — M150 Primary generalized (osteo)arthritis: Secondary | ICD-10-CM | POA: Diagnosis not present

## 2019-06-05 DIAGNOSIS — M810 Age-related osteoporosis without current pathological fracture: Secondary | ICD-10-CM | POA: Diagnosis not present

## 2019-06-05 DIAGNOSIS — I10 Essential (primary) hypertension: Secondary | ICD-10-CM | POA: Diagnosis not present

## 2019-06-08 NOTE — Progress Notes (Signed)
Subjective: [name] is a 86 y.o. female patient seen today painful mycotic nails b/l that are difficult to trim. Pain interferes with ambulation. Aggravating factors include wearing enclosed shoe gear. Pain is relieved with periodic professional debridement.  Patient Active Problem List   Diagnosis Date Noted  . Anemia 11/07/2018  . Hip fracture ([place]) 10/31/2018  . Accelerated hypertension 10/31/2018  . Closed intertrochanteric fracture of hip, right, initial encounter ([place])   . Essential hypertension 07/09/2018  . AKI (acute kidney injury) ([place]) 07/09/2018  . Closed right hip fracture ([place]) 07/09/2018  . Trigger middle finger of right hand 02/24/2016  . Carpal tunnel syndrome of right wrist 11/28/2015  . History of breast cancer 11/27/2011    Current Outpatient Medications on File Prior to Visit  Medication Sig Dispense Refill  . acetaminophen (TYLENOL) 325 MG tablet Take 2 tablets (650 mg total) by mouth every 6 (six) hours as needed for mild pain. 30 tablet 0  . amLODipine (NORVASC) 2.5 MG tablet Take 2.5 mg by mouth daily.    Marland Kitchen amoxicillin-clavulanate (AUGMENTIN) 875-125 MG tablet     . cephALEXin (KEFLEX) 500 MG capsule     . Cranberry 500 MG CAPS Take by mouth.    . docusate sodium (COLACE) 100 MG capsule Take 2 capsules (200 mg total) by mouth daily. 10 capsule 0  . furosemide (LASIX) 40 MG tablet Take 40 mg by mouth Daily.     . meclizine (ANTIVERT) 25 MG tablet Take 25 mg by mouth daily.    . methocarbamol (ROBAXIN) 500 MG tablet Take 1 tablet (500 mg total) by mouth every 8 (eight) hours as needed for muscle spasms. 30 tablet 0  . Multiple Vitamin (MULTIVITAMIN WITH MINERALS) TABS tablet Take 1 tablet by mouth daily.    . polyethylene glycol (MIRALAX / GLYCOLAX) 17 g packet Take 17 g by mouth daily as needed for moderate constipation. 14 each 0  . polyvinyl alcohol (LIQUIFILM TEARS) 1.4 % ophthalmic solution Place 1 drop into both eyes as needed for dry eyes.    .  traMADol (ULTRAM) 50 MG tablet Take 1 tablet (50 mg total) by mouth every 6 (six) hours as needed for severe pain. 30 tablet 0  . triamcinolone cream (KENALOG) 0.1 %      No current facility-administered medications on file prior to visit.    Allergies  Allergen Reactions  . Macrobid [Nitrofurantoin Macrocrystal]     Caused shortness of breath.  . Other Nausea And Vomiting    All seafood    Objective: Physical Exam  General: [name] is a pleasant 86 y.o. Caucasian female, in NAD. AAO x 3.   Vascular:  Neurovascular status unchanged b/l lower extremities. Capillary fill time to digits <3 seconds b/l lower extremities. Palpable DP pulses b/l. Palpable PT pulses b/l. Pedal hair sparse b/l. Skin temperature gradient within normal limits b/l.  Dermatological:  Pedal skin with normal turgor, texture and tone bilaterally. No open wounds bilaterally. No interdigital macerations bilaterally. Toenails 1-5 b/l elongated, discolored, dystrophic, thickened, crumbly with subungual debris and tenderness to dorsal palpation.  Musculoskeletal:  Normal muscle strength 5/5 to all lower extremity muscle groups bilaterally. No pain crepitus or joint limitation noted with ROM b/l. Pes planus deformity noted b/l.   Neurological:  Protective sensation intact 5/5 intact bilaterally with 10g monofilament b/l. Vibratory sensation intact b/l.  Assessment and Plan:  1. Pain due to onychomycosis of toenails of both feet    -Examined patient. -No new findings. No  new orders. -Toenails 1-5 b/l were debrided in length and girth with sterile nail nippers and dremel without iatrogenic bleeding.  -Patient to continue soft, supportive shoe gear daily. -Patient to report any pedal injuries to medical professional immediately. -Patient/POA to call should there be question/concern in the interim.  Return in about 3 months (around 09/03/2019) for nail trim.  Marzetta Board, DPM

## 2019-06-23 DIAGNOSIS — M25512 Pain in left shoulder: Secondary | ICD-10-CM | POA: Diagnosis not present

## 2019-07-02 DIAGNOSIS — M25512 Pain in left shoulder: Secondary | ICD-10-CM | POA: Diagnosis not present

## 2019-07-07 NOTE — Progress Notes (Signed)
[name], [name] ([id number]) Visit Report for 03/17/2019 Arrival Information Details Patient Name: Date of Service: [name], [name] [facility] 03/17/2019 11:00 A M Medical Record Number: [id number] Patient Account Number: [id number] Date of Birth/Sex: Treating RN: [birth year] (86 y.o. Orvan Falconer Primary Care Wong Steadham: Lajean Manes T Other Clinician: Referring Neziah Braley: Treating Glenola Wheat/Extender: Evelena Peat in Treatment: 3 Visit Information History Since Last Visit Added or deleted any medications: No Patient Arrived: Gilford Rile Any new allergies or adverse reactions: No Arrival Time: 11:50 Had a fall or experienced change in No Accompanied By: caregiver activities of daily living that may affect Transfer Assistance: None risk of falls: Patient Identification Verified: Yes Signs or symptoms of abuse/neglect since last visito No Secondary Verification Process Completed: Yes Hospitalized since last visit: No Patient Requires Transmission-Based Precautions: No Implantable device outside of the clinic excluding No Patient Has Alerts: No cellular tissue based products placed in the center since last visit: Has Dressing in Place as Prescribed: Yes Pain Present Now: No Electronic Signature(s) Signed: 04/22/2019 9:21:08 AM By: Sandre Kitty Entered By: Sandre Kitty on 03/17/2019 11:50:27 -------------------------------------------------------------------------------- Compression Therapy Details Patient Name: Date of Service: [name], [name] 03/17/2019 11:00 A M Medical Record Number: [id number] Patient Account Number: [id number] Date of Birth/Sex: Treating RN: 18-Sep-1922 (86 y.o. Orvan Falconer Primary Care Kc Sedlak: Lajean Manes T Other Clinician: Referring Elo Marmolejos: Treating Halli Equihua/Extender: Jacqlyn Larsen Weeks in Treatment: 3 Compression Therapy Performed for Wound Assessment: Wound #1 Left,Medial Lower Leg Performed By:  Clinician Carlene Coria, RN Compression Type: Three Layer Post Procedure Diagnosis Same as Pre-procedure Electronic Signature(s) Signed: 03/18/2019 7:20:02 AM By: Carlene Coria RN Entered By: Carlene Coria on 03/17/2019 12:32:00 -------------------------------------------------------------------------------- Encounter Discharge Information Details Patient Name: Date of Service: [name], [name] 03/17/2019 11:00 A M Medical Record Number: [id number] Patient Account Number: [id number] Date of Birth/Sex: Treating RN: 11/06/1922 (86 y.o. Clearnce Sorrel Primary Care Kyira Volkert: Patrina Levering Other Clinician: Referring Pacey Willadsen: Treating Chaden Doom/Extender: Evelena Peat in Treatment: 3 Encounter Discharge Information Items Discharge Condition: Stable Ambulatory Status: Wheelchair Discharge Destination: Home Transportation: Other Accompanied By: caregiver Schedule Follow-up Appointment: Yes Clinical Summary of Care: Patient Declined Electronic Signature(s) Signed: 03/17/2019 5:46:17 PM By: Kela Millin Entered By: Kela Millin on 03/17/2019 12:38:59 -------------------------------------------------------------------------------- Lower Extremity Assessment Details Patient Name: Date of Service: [name], [name] [facility] 03/17/2019 11:00 A M Medical Record Number: [id number] Patient Account Number: [id number] Date of Birth/Sex: Treating RN: Jan 29, 1922 (86 y.o. Elam Dutch Primary Care Jaevion Goto: Lajean Manes T Other Clinician: Referring Senya Hinzman: Treating Caldonia Leap/Extender: Venita Lick T Weeks in Treatment: 3 Edema Assessment Assessed: [Left: No] [Right: No] Edema: [Left: Ye] [Right: s] Calf Left: Right: Point of Measurement: 30 cm From Medial Instep 30.1 cm cm Ankle Left: Right: Point of Measurement: 11 cm From Medial Instep 19 cm cm Vascular Assessment Pulses: Dorsalis Pedis Palpable: [Left:Yes] Electronic  Signature(s) Signed: 03/17/2019 6:17:27 PM By: Baruch Gouty RN, BSN Entered By: Baruch Gouty on 03/17/2019 12:05:08 -------------------------------------------------------------------------------- Multi Wound Chart Details Patient Name: Date of Service: [name] 03/17/2019 11:00 A M Medical Record Number: [id number] Patient Account Number: [id number] Date of Birth/Sex: Treating RN: 13-Jan-1922 (86 y.o. Orvan Falconer Primary Care Binyamin Nelis: Lajean Manes T Other Clinician: Referring Ambrea Hegler: Treating Ashayla Subia/Extender: Jacqlyn Larsen Weeks in Treatment: 3 Vital Signs Height(in): 63 Pulse(bpm): 79 Weight(lbs): 155 Blood Pressure(mmHg): 133/79 Body Mass Index(BMI): 27 Temperature(F): 97.9 Respiratory Rate(breaths/min): 18 Photos: [1:No Photos Left Lower Leg - Medial] [N/A:N/A  N/A] Wound Location: [1:Not Known] [N/A:N/A] Wounding Event: [1:Venous Leg Ulcer] [N/A:N/A] Primary Etiology: [1:Deep Vein Thrombosis, Hypertension,] [N/A:N/A] Comorbid History: [1:Osteoarthritis 01/09/2019] [N/A:N/A] Date Acquired: [1:3] [N/A:N/A] Weeks of Treatment: [1:Open] [N/A:N/A] Wound Status: [1:3.5x1.5x0.1] [N/A:N/A] Measurements L x W x D (cm) [1:4.123] [N/A:N/A] A (cm) : rea [1:0.412] [N/A:N/A] Volume (cm) : [1:71.90%] [N/A:N/A] % Reduction in Area: [1:72.00%] [N/A:N/A] % Reduction in Volume: [1:Full Thickness Without Exposed] [N/A:N/A] Classification: [1:Support Structures Medium] [N/A:N/A] Exudate Amount: [1:Serosanguineous] [N/A:N/A] Exudate Type: [1:red, brown] [N/A:N/A] Exudate Color: [1:Flat and Intact] [N/A:N/A] Wound Margin: [1:Large (67-100%)] [N/A:N/A] Granulation Amount: [1:Red] [N/A:N/A] Granulation Quality: [1:None Present (0%)] [N/A:N/A] Necrotic Amount: [1:Fat Layer (Subcutaneous Tissue)] [N/A:N/A] Exposed Structures: [1:Exposed: Yes Fascia: No Tendon: No Muscle: No Joint: No Bone: No Medium (34-66%)] [N/A:N/A] Epithelialization:  [1:Compression Therapy] [N/A:N/A] Treatment Notes Wound #1 (Left, Medial Lower Leg) 1. Cleanse With Wound Cleanser Soap and water 2. Periwound Care TCA Cream 3. Primary Dressing Applied Iodoflex 4. Secondary Dressing ABD Pad Dry Gauze 6. Support Layer Applied 3 layer compression Water quality scientist) Signed: 03/17/2019 5:48:31 PM By: Linton Ham MD Signed: 03/18/2019 7:20:02 AM By: Carlene Coria RN Entered By: Linton Ham on 03/17/2019 13:02:35 -------------------------------------------------------------------------------- Multi-Disciplinary Care Plan Details Patient Name: Date of Service: [name], [name] [facility] 03/17/2019 11:00 A M Medical Record Number: [id number] Patient Account Number: [id number] Date of Birth/Sex: Treating RN: October 18, 1922 (86 y.o. Orvan Falconer Primary Care Krystale Rinkenberger: Lajean Manes T Other Clinician: Referring Esten Dollar: Treating Kemon Devincenzi/Extender: Jacqlyn Larsen Weeks in Treatment: 3 Active Inactive Wound/Skin Impairment Nursing Diagnoses: Knowledge deficit related to ulceration/compromised skin integrity Goals: Patient/caregiver will verbalize understanding of skin care regimen Date Initiated: 02/24/2019 Target Resolution Date: 03/24/2019 Goal Status: Active Ulcer/skin breakdown will have a volume reduction of 30% by week 4 Date Initiated: 02/24/2019 Target Resolution Date: 03/24/2019 Goal Status: Active Interventions: Assess patient/caregiver ability to obtain necessary supplies Assess patient/caregiver ability to perform ulcer/skin care regimen upon admission and as needed Assess ulceration(s) every visit Notes: Electronic Signature(s) Signed: 03/18/2019 7:20:02 AM By: Carlene Coria RN Entered By: Carlene Coria on 03/17/2019 11:20:30 -------------------------------------------------------------------------------- Pain Assessment Details Patient Name: Date of Service: [name], [name] [facility] 03/17/2019 11:00 [place]  Record Number: [id number] Patient Account Number: [id number] Date of Birth/Sex: Treating RN: [birth year] (86 y.o. Orvan Falconer Primary Care Aviana Shevlin: Lajean Manes T Other Clinician: Referring Morrigan Wickens: Treating Tammy Ericsson/Extender: Jacqlyn Larsen Weeks in Treatment: 3 Active Problems Location of Pain Severity and Description of Pain Patient Has Paino No Site Locations Rate the pain. Rate the pain. Current Pain Level: 0 Pain Management and Medication Current Pain Management: Electronic Signature(s) Signed: 03/17/2019 6:17:27 PM By: Baruch Gouty RN, BSN Signed: 03/18/2019 7:20:02 AM By: Carlene Coria RN Entered By: Baruch Gouty on 03/17/2019 12:01:49 -------------------------------------------------------------------------------- Patient/Caregiver Education Details Patient Name: Date of Service: [name] 3/9/2021andnbsp11:00 A M Medical Record Number: [id number] Patient Account Number: [id number] Date of Birth/Gender: Treating RN: June 18, 1922 (86 y.o. Orvan Falconer Primary Care Physician: Lajean Manes T Other Clinician: Referring Physician: Treating Physician/Extender: Evelena Peat in Treatment: 3 Education Assessment Education Provided To: Patient Education Topics Provided Wound/Skin Impairment: Methods: Explain/Verbal Responses: State content correctly Electronic Signature(s) Signed: 03/18/2019 7:20:02 AM By: Carlene Coria RN Entered By: Carlene Coria on 03/17/2019 11:21:19 -------------------------------------------------------------------------------- Wound Assessment Details Patient Name: Date of Service: [name], [name] [facility] 03/17/2019 11:00 A M Medical Record Number: [id number] Patient Account Number: [id number] Date of Birth/Sex: Treating RN: 1922/09/03 (86 y.o. Orvan Falconer Primary Care Justyn Boyson: Felipa Eth,  Hal T Other Clinician: Referring Zyree Traynham: Treating Melena Hayes/Extender: Rudean Hitt, Christiane Ha T Weeks in Treatment: 3 Wound Status Wound Number: 1 Primary Etiology: Venous Leg Ulcer Wound Location: Left Lower Leg - Medial Wound Status: Open Wounding Event: Not Known Comorbid History: Deep Vein Thrombosis, Hypertension, Osteoarthritis Date Acquired: 01/09/2019 Weeks Of Treatment: 3 Clustered Wound: No Photos Wound Measurements Length: (cm) 3.5 Width: (cm) 1.5 Depth: (cm) 0.1 Area: (cm) 4.123 Volume: (cm) 0.412 % Reduction in Area: 71.9% % Reduction in Volume: 72% Epithelialization: Medium (34-66%) Tunneling: No Undermining: No Wound Description Classification: Full Thickness Without Exposed Support Structures Wound Margin: Flat and Intact Exudate Amount: Medium Exudate Type: Serosanguineous Exudate Color: red, brown Foul Odor After Cleansing: No Slough/Fibrino No Wound Bed Granulation Amount: Large (67-100%) Exposed Structure Granulation Quality: Red Fascia Exposed: No Necrotic Amount: None Present (0%) Fat Layer (Subcutaneous Tissue) Exposed: Yes Tendon Exposed: No Muscle Exposed: No Joint Exposed: No Bone Exposed: No Electronic Signature(s) Signed: 03/24/2019 4:45:30 PM By: Mikeal Hawthorne EMT/HBOT Signed: 07/07/2019 5:25:54 PM By: Carlene Coria RN Previous Signature: 03/17/2019 6:17:27 PM Version By: Baruch Gouty RN, BSN Entered By: Mikeal Hawthorne on 03/24/2019 15:42:42 -------------------------------------------------------------------------------- Vitals Details Patient Name: Date of Service: [name] 03/17/2019 11:00 A M Medical Record Number: [id number] Patient Account Number: [id number] Date of Birth/Sex: Treating RN: 11-Mar-1922 (86 y.o. Orvan Falconer Primary Care Coston Mandato: Lajean Manes T Other Clinician: Referring Teriyah Purington: Treating Brennin Durfee/Extender: Jacqlyn Larsen Weeks in Treatment: 3 Vital Signs Time Taken: 11:52 Temperature (F): 97.9 Height (in): 63 Pulse (bpm): 79 Weight (lbs):  155 Respiratory Rate (breaths/min): 18 Body Mass Index (BMI): 27.5 Blood Pressure (mmHg): 133/79 Reference Range: 80 - 120 mg / dl Electronic Signature(s) Signed: 04/22/2019 9:21:08 AM By: Sandre Kitty Entered By: Sandre Kitty on 03/17/2019 11:52:48

## 2019-07-08 DIAGNOSIS — M25512 Pain in left shoulder: Secondary | ICD-10-CM | POA: Diagnosis not present

## 2019-07-08 DIAGNOSIS — M7542 Impingement syndrome of left shoulder: Secondary | ICD-10-CM | POA: Diagnosis not present

## 2019-07-22 DIAGNOSIS — M25512 Pain in left shoulder: Secondary | ICD-10-CM | POA: Diagnosis not present

## 2019-07-30 DIAGNOSIS — M810 Age-related osteoporosis without current pathological fracture: Secondary | ICD-10-CM | POA: Diagnosis not present

## 2019-07-30 DIAGNOSIS — I10 Essential (primary) hypertension: Secondary | ICD-10-CM | POA: Diagnosis not present

## 2019-07-30 DIAGNOSIS — M8580 Other specified disorders of bone density and structure, unspecified site: Secondary | ICD-10-CM | POA: Diagnosis not present

## 2019-07-30 DIAGNOSIS — M150 Primary generalized (osteo)arthritis: Secondary | ICD-10-CM | POA: Diagnosis not present

## 2019-07-31 DIAGNOSIS — M25512 Pain in left shoulder: Secondary | ICD-10-CM | POA: Diagnosis not present

## 2019-09-04 ENCOUNTER — Other Ambulatory Visit: Payer: Self-pay

## 2019-09-04 ENCOUNTER — Ambulatory Visit: Payer: Medicare HMO | Admitting: Podiatry

## 2019-09-04 ENCOUNTER — Encounter: Payer: Self-pay | Admitting: Podiatry

## 2019-09-04 DIAGNOSIS — M79675 Pain in left toe(s): Secondary | ICD-10-CM | POA: Diagnosis not present

## 2019-09-04 DIAGNOSIS — B351 Tinea unguium: Secondary | ICD-10-CM | POA: Diagnosis not present

## 2019-09-04 DIAGNOSIS — M79674 Pain in right toe(s): Secondary | ICD-10-CM

## 2019-09-06 NOTE — Progress Notes (Signed)
Subjective: [name] is a 86 y.o. female patient seen today painful mycotic nails b/l that are difficult to trim. Pain interferes with ambulation. Aggravating factors include wearing enclosed shoe gear. Pain is relieved with periodic professional debridement.  She resides at [facility]  She is accompanied by a caregiver on today's visit.   Patient Active Problem List   Diagnosis Date Noted  . Anemia 11/07/2018  . Hip fracture ([place]) 10/31/2018  . Accelerated hypertension 10/31/2018  . Closed intertrochanteric fracture of hip, right, initial encounter ([place])   . Essential hypertension 07/09/2018  . AKI (acute kidney injury) ([place]) 07/09/2018  . Closed right hip fracture ([place]) 07/09/2018  . Trigger middle finger of right hand 02/24/2016  . Carpal tunnel syndrome of right wrist 11/28/2015  . History of breast cancer 11/27/2011    Current Outpatient Medications on File Prior to Visit  Medication Sig Dispense Refill  . acetaminophen (TYLENOL) 325 MG tablet Take 2 tablets (650 mg total) by mouth every 6 (six) hours as needed for mild pain. 30 tablet 0  . amLODipine (NORVASC) 2.5 MG tablet Take 2.5 mg by mouth daily.    Marland Kitchen amoxicillin-clavulanate (AUGMENTIN) 875-125 MG tablet     . cephALEXin (KEFLEX) 500 MG capsule     . Cranberry 500 MG CAPS Take by mouth.    . docusate sodium (COLACE) 100 MG capsule Take 2 capsules (200 mg total) by mouth daily. 10 capsule 0  . furosemide (LASIX) 40 MG tablet Take 40 mg by mouth Daily.     . meclizine (ANTIVERT) 25 MG tablet Take 25 mg by mouth daily.    . methocarbamol (ROBAXIN) 500 MG tablet Take 1 tablet (500 mg total) by mouth every 8 (eight) hours as needed for muscle spasms. 30 tablet 0  . Multiple Vitamin (MULTIVITAMIN WITH MINERALS) TABS tablet Take 1 tablet by mouth daily.    . polyethylene glycol (MIRALAX / GLYCOLAX) 17 g packet Take 17 g by mouth daily as needed for moderate constipation. 14 each 0  . polyvinyl alcohol (LIQUIFILM TEARS)  1.4 % ophthalmic solution Place 1 drop into both eyes as needed for dry eyes.    . predniSONE (STERAPRED UNI-PAK 21 TAB) 5 MG (21) TBPK tablet     . traMADol (ULTRAM) 50 MG tablet Take 1 tablet (50 mg total) by mouth every 6 (six) hours as needed for severe pain. 30 tablet 0  . triamcinolone cream (KENALOG) 0.1 %      No current facility-administered medications on file prior to visit.    Allergies  Allergen Reactions  . Macrobid [Nitrofurantoin Macrocrystal]     Caused shortness of breath.  . Other Nausea And Vomiting    All seafood    Objective: Physical Exam  General: [name] is a pleasant 86 y.o. Caucasian female, in NAD. AAO x 3.   Vascular:  Neurovascular status unchanged b/l lower extremities. Capillary fill time to digits <3 seconds b/l lower extremities. Palpable DP pulses b/l. Palpable PT pulses b/l. Pedal hair sparse b/l. Skin temperature gradient within normal limits b/l.  Dermatological:  Pedal skin with normal turgor, texture and tone bilaterally. No open wounds bilaterally. No interdigital macerations bilaterally. Toenails 1-5 b/l elongated, discolored, dystrophic, thickened, crumbly with subungual debris and tenderness to dorsal palpation.  Musculoskeletal:  Normal muscle strength 5/5 to all lower extremity muscle groups bilaterally. No pain crepitus or joint limitation noted with ROM b/l. Pes planus deformity noted b/l.   Neurological:  Protective sensation intact 5/5 intact bilaterally  with 10g monofilament b/l. Vibratory sensation intact b/l.  Assessment and Plan:  1. Pain due to onychomycosis of toenails of both feet    -Examined patient. -No new findings. No new orders. -Toenails 1-5 b/l were debrided in length and girth with sterile nail nippers and dremel without iatrogenic bleeding.  -Patient to continue soft, supportive shoe gear daily. -Patient to report any pedal injuries to medical professional immediately. -Patient/POA to call should  there be question/concern in the interim.  Return in about 3 months (around 12/05/2019) for nail trim.  Marzetta Board, DPM

## 2019-09-08 DIAGNOSIS — M8580 Other specified disorders of bone density and structure, unspecified site: Secondary | ICD-10-CM | POA: Diagnosis not present

## 2019-09-08 DIAGNOSIS — I10 Essential (primary) hypertension: Secondary | ICD-10-CM | POA: Diagnosis not present

## 2019-09-08 DIAGNOSIS — M810 Age-related osteoporosis without current pathological fracture: Secondary | ICD-10-CM | POA: Diagnosis not present

## 2019-09-08 DIAGNOSIS — M150 Primary generalized (osteo)arthritis: Secondary | ICD-10-CM | POA: Diagnosis not present

## 2019-10-08 DIAGNOSIS — M79674 Pain in right toe(s): Secondary | ICD-10-CM | POA: Diagnosis not present

## 2019-10-29 DIAGNOSIS — M8580 Other specified disorders of bone density and structure, unspecified site: Secondary | ICD-10-CM | POA: Diagnosis not present

## 2019-10-29 DIAGNOSIS — D692 Other nonthrombocytopenic purpura: Secondary | ICD-10-CM | POA: Diagnosis not present

## 2019-10-29 DIAGNOSIS — Z79899 Other long term (current) drug therapy: Secondary | ICD-10-CM | POA: Diagnosis not present

## 2019-10-29 DIAGNOSIS — I10 Essential (primary) hypertension: Secondary | ICD-10-CM | POA: Diagnosis not present

## 2019-10-29 DIAGNOSIS — Z0000 Encounter for general adult medical examination without abnormal findings: Secondary | ICD-10-CM | POA: Diagnosis not present

## 2019-10-29 DIAGNOSIS — Z1389 Encounter for screening for other disorder: Secondary | ICD-10-CM | POA: Diagnosis not present

## 2019-10-29 DIAGNOSIS — Z23 Encounter for immunization: Secondary | ICD-10-CM | POA: Diagnosis not present

## 2019-10-29 DIAGNOSIS — I340 Nonrheumatic mitral (valve) insufficiency: Secondary | ICD-10-CM | POA: Diagnosis not present

## 2019-10-29 DIAGNOSIS — S81811A Laceration without foreign body, right lower leg, initial encounter: Secondary | ICD-10-CM | POA: Diagnosis not present

## 2019-10-30 DIAGNOSIS — H353131 Nonexudative age-related macular degeneration, bilateral, early dry stage: Secondary | ICD-10-CM | POA: Diagnosis not present

## 2019-10-30 DIAGNOSIS — H5213 Myopia, bilateral: Secondary | ICD-10-CM | POA: Diagnosis not present

## 2019-10-30 DIAGNOSIS — H02055 Trichiasis without entropian left lower eyelid: Secondary | ICD-10-CM | POA: Diagnosis not present

## 2019-11-02 DIAGNOSIS — Z79899 Other long term (current) drug therapy: Secondary | ICD-10-CM | POA: Diagnosis not present

## 2019-11-05 ENCOUNTER — Ambulatory Visit: Payer: Medicare HMO | Admitting: Podiatry

## 2019-11-05 ENCOUNTER — Other Ambulatory Visit: Payer: Self-pay

## 2019-11-05 DIAGNOSIS — M79674 Pain in right toe(s): Secondary | ICD-10-CM

## 2019-11-05 DIAGNOSIS — B351 Tinea unguium: Secondary | ICD-10-CM

## 2019-11-05 DIAGNOSIS — M79675 Pain in left toe(s): Secondary | ICD-10-CM

## 2019-11-08 ENCOUNTER — Encounter: Payer: Self-pay | Admitting: Podiatry

## 2019-11-08 NOTE — Progress Notes (Signed)
  Subjective:  Patient ID: [name], female    DOB: November 11, 1922,  MRN: [id number]  Chief Complaint  Patient presents with  . routine foot care    nail trim, painful toes     86 y.o. female presents with the above complaint. History confirmed with patient.  Debridement has been helpful and relieves her pain. Objective:  Physical Exam: warm, good capillary refill, no trophic changes or ulcerative lesions, normal DP and PT pulses and normal sensory exam.  Onychomycosis x10 with elongated thickened toenails with subungual debris and yellow discoloration  Assessment:  No diagnosis found.   Plan:  Patient was evaluated and treated and all questions answered.  Discussed the etiology and treatment options for the condition in detail with the patient. Educated patient on the topical and oral treatment options for mycotic nails. Recommended debridement of the nails today. Sharp and mechanical debridement performed of all painful and mycotic nails today. Nails debrided in length and thickness using a nail nipper and a mechanical burr to level of comfort. Discussed treatment options including appropriate shoe gear. Follow up as needed for painful nails.   Return in about 2 months (around 01/05/2020).

## 2019-11-27 DIAGNOSIS — I10 Essential (primary) hypertension: Secondary | ICD-10-CM | POA: Diagnosis not present

## 2019-11-27 DIAGNOSIS — S81811D Laceration without foreign body, right lower leg, subsequent encounter: Secondary | ICD-10-CM | POA: Diagnosis not present

## 2019-12-09 DIAGNOSIS — L97921 Non-pressure chronic ulcer of unspecified part of left lower leg limited to breakdown of skin: Secondary | ICD-10-CM | POA: Diagnosis not present

## 2019-12-09 DIAGNOSIS — J309 Allergic rhinitis, unspecified: Secondary | ICD-10-CM | POA: Diagnosis not present

## 2019-12-09 DIAGNOSIS — H04129 Dry eye syndrome of unspecified lacrimal gland: Secondary | ICD-10-CM | POA: Diagnosis not present

## 2019-12-09 DIAGNOSIS — R2689 Other abnormalities of gait and mobility: Secondary | ICD-10-CM | POA: Diagnosis not present

## 2019-12-09 DIAGNOSIS — S72002D Fracture of unspecified part of neck of left femur, subsequent encounter for closed fracture with routine healing: Secondary | ICD-10-CM | POA: Diagnosis not present

## 2019-12-09 DIAGNOSIS — R609 Edema, unspecified: Secondary | ICD-10-CM | POA: Diagnosis not present

## 2019-12-09 DIAGNOSIS — M1991 Primary osteoarthritis, unspecified site: Secondary | ICD-10-CM | POA: Diagnosis not present

## 2019-12-09 DIAGNOSIS — I10 Essential (primary) hypertension: Secondary | ICD-10-CM | POA: Diagnosis not present

## 2019-12-09 DIAGNOSIS — M80052D Age-related osteoporosis with current pathological fracture, left femur, subsequent encounter for fracture with routine healing: Secondary | ICD-10-CM | POA: Diagnosis not present

## 2019-12-09 DIAGNOSIS — M6281 Muscle weakness (generalized): Secondary | ICD-10-CM | POA: Diagnosis not present

## 2019-12-09 DIAGNOSIS — M25571 Pain in right ankle and joints of right foot: Secondary | ICD-10-CM | POA: Diagnosis not present

## 2019-12-10 DIAGNOSIS — S72002D Fracture of unspecified part of neck of left femur, subsequent encounter for closed fracture with routine healing: Secondary | ICD-10-CM | POA: Diagnosis not present

## 2019-12-10 DIAGNOSIS — R2689 Other abnormalities of gait and mobility: Secondary | ICD-10-CM | POA: Diagnosis not present

## 2019-12-11 DIAGNOSIS — R2689 Other abnormalities of gait and mobility: Secondary | ICD-10-CM | POA: Diagnosis not present

## 2019-12-11 DIAGNOSIS — S72002D Fracture of unspecified part of neck of left femur, subsequent encounter for closed fracture with routine healing: Secondary | ICD-10-CM | POA: Diagnosis not present

## 2019-12-14 DIAGNOSIS — R2689 Other abnormalities of gait and mobility: Secondary | ICD-10-CM | POA: Diagnosis not present

## 2019-12-14 DIAGNOSIS — S72002D Fracture of unspecified part of neck of left femur, subsequent encounter for closed fracture with routine healing: Secondary | ICD-10-CM | POA: Diagnosis not present

## 2019-12-15 DIAGNOSIS — S72002D Fracture of unspecified part of neck of left femur, subsequent encounter for closed fracture with routine healing: Secondary | ICD-10-CM | POA: Diagnosis not present

## 2019-12-15 DIAGNOSIS — R2689 Other abnormalities of gait and mobility: Secondary | ICD-10-CM | POA: Diagnosis not present

## 2019-12-16 DIAGNOSIS — M1991 Primary osteoarthritis, unspecified site: Secondary | ICD-10-CM | POA: Diagnosis not present

## 2019-12-16 DIAGNOSIS — I10 Essential (primary) hypertension: Secondary | ICD-10-CM | POA: Diagnosis not present

## 2019-12-16 DIAGNOSIS — R2689 Other abnormalities of gait and mobility: Secondary | ICD-10-CM | POA: Diagnosis not present

## 2019-12-16 DIAGNOSIS — M6281 Muscle weakness (generalized): Secondary | ICD-10-CM | POA: Diagnosis not present

## 2019-12-16 DIAGNOSIS — H04129 Dry eye syndrome of unspecified lacrimal gland: Secondary | ICD-10-CM | POA: Diagnosis not present

## 2019-12-16 DIAGNOSIS — S72002D Fracture of unspecified part of neck of left femur, subsequent encounter for closed fracture with routine healing: Secondary | ICD-10-CM | POA: Diagnosis not present

## 2019-12-16 DIAGNOSIS — R609 Edema, unspecified: Secondary | ICD-10-CM | POA: Diagnosis not present

## 2019-12-16 DIAGNOSIS — K5900 Constipation, unspecified: Secondary | ICD-10-CM | POA: Diagnosis not present

## 2019-12-16 DIAGNOSIS — M25571 Pain in right ankle and joints of right foot: Secondary | ICD-10-CM | POA: Diagnosis not present

## 2019-12-16 DIAGNOSIS — J309 Allergic rhinitis, unspecified: Secondary | ICD-10-CM | POA: Diagnosis not present

## 2019-12-16 DIAGNOSIS — L97921 Non-pressure chronic ulcer of unspecified part of left lower leg limited to breakdown of skin: Secondary | ICD-10-CM | POA: Diagnosis not present

## 2019-12-17 DIAGNOSIS — M80052D Age-related osteoporosis with current pathological fracture, left femur, subsequent encounter for fracture with routine healing: Secondary | ICD-10-CM | POA: Diagnosis not present

## 2019-12-17 DIAGNOSIS — M25571 Pain in right ankle and joints of right foot: Secondary | ICD-10-CM | POA: Diagnosis not present

## 2019-12-17 DIAGNOSIS — M1991 Primary osteoarthritis, unspecified site: Secondary | ICD-10-CM | POA: Diagnosis not present

## 2019-12-17 DIAGNOSIS — R609 Edema, unspecified: Secondary | ICD-10-CM | POA: Diagnosis not present

## 2019-12-17 DIAGNOSIS — I10 Essential (primary) hypertension: Secondary | ICD-10-CM | POA: Diagnosis not present

## 2019-12-17 DIAGNOSIS — M6281 Muscle weakness (generalized): Secondary | ICD-10-CM | POA: Diagnosis not present

## 2019-12-17 DIAGNOSIS — S72002D Fracture of unspecified part of neck of left femur, subsequent encounter for closed fracture with routine healing: Secondary | ICD-10-CM | POA: Diagnosis not present

## 2019-12-17 DIAGNOSIS — L97921 Non-pressure chronic ulcer of unspecified part of left lower leg limited to breakdown of skin: Secondary | ICD-10-CM | POA: Diagnosis not present

## 2019-12-17 DIAGNOSIS — J309 Allergic rhinitis, unspecified: Secondary | ICD-10-CM | POA: Diagnosis not present

## 2019-12-17 DIAGNOSIS — H04129 Dry eye syndrome of unspecified lacrimal gland: Secondary | ICD-10-CM | POA: Diagnosis not present

## 2019-12-17 DIAGNOSIS — R2689 Other abnormalities of gait and mobility: Secondary | ICD-10-CM | POA: Diagnosis not present

## 2019-12-18 DIAGNOSIS — S72002D Fracture of unspecified part of neck of left femur, subsequent encounter for closed fracture with routine healing: Secondary | ICD-10-CM | POA: Diagnosis not present

## 2019-12-18 DIAGNOSIS — R2689 Other abnormalities of gait and mobility: Secondary | ICD-10-CM | POA: Diagnosis not present

## 2019-12-21 DIAGNOSIS — S72002D Fracture of unspecified part of neck of left femur, subsequent encounter for closed fracture with routine healing: Secondary | ICD-10-CM | POA: Diagnosis not present

## 2019-12-21 DIAGNOSIS — R2689 Other abnormalities of gait and mobility: Secondary | ICD-10-CM | POA: Diagnosis not present

## 2019-12-22 DIAGNOSIS — R2689 Other abnormalities of gait and mobility: Secondary | ICD-10-CM | POA: Diagnosis not present

## 2019-12-22 DIAGNOSIS — S72002D Fracture of unspecified part of neck of left femur, subsequent encounter for closed fracture with routine healing: Secondary | ICD-10-CM | POA: Diagnosis not present

## 2019-12-23 DIAGNOSIS — K5900 Constipation, unspecified: Secondary | ICD-10-CM | POA: Diagnosis not present

## 2019-12-23 DIAGNOSIS — H04129 Dry eye syndrome of unspecified lacrimal gland: Secondary | ICD-10-CM | POA: Diagnosis not present

## 2019-12-23 DIAGNOSIS — M80052D Age-related osteoporosis with current pathological fracture, left femur, subsequent encounter for fracture with routine healing: Secondary | ICD-10-CM | POA: Diagnosis not present

## 2019-12-23 DIAGNOSIS — L97921 Non-pressure chronic ulcer of unspecified part of left lower leg limited to breakdown of skin: Secondary | ICD-10-CM | POA: Diagnosis not present

## 2019-12-23 DIAGNOSIS — M6281 Muscle weakness (generalized): Secondary | ICD-10-CM | POA: Diagnosis not present

## 2019-12-23 DIAGNOSIS — I10 Essential (primary) hypertension: Secondary | ICD-10-CM | POA: Diagnosis not present

## 2019-12-23 DIAGNOSIS — J309 Allergic rhinitis, unspecified: Secondary | ICD-10-CM | POA: Diagnosis not present

## 2019-12-23 DIAGNOSIS — M1991 Primary osteoarthritis, unspecified site: Secondary | ICD-10-CM | POA: Diagnosis not present

## 2019-12-23 DIAGNOSIS — R609 Edema, unspecified: Secondary | ICD-10-CM | POA: Diagnosis not present

## 2020-01-04 ENCOUNTER — Ambulatory Visit: Payer: Medicare HMO | Admitting: Podiatry

## 2020-01-05 DIAGNOSIS — I10 Essential (primary) hypertension: Secondary | ICD-10-CM | POA: Diagnosis not present

## 2020-01-05 DIAGNOSIS — M8580 Other specified disorders of bone density and structure, unspecified site: Secondary | ICD-10-CM | POA: Diagnosis not present

## 2020-02-10 ENCOUNTER — Ambulatory Visit: Payer: Medicare HMO | Admitting: Podiatry

## 2020-02-10 ENCOUNTER — Encounter: Payer: Self-pay | Admitting: Podiatry

## 2020-02-10 ENCOUNTER — Other Ambulatory Visit: Payer: Self-pay

## 2020-02-10 DIAGNOSIS — B351 Tinea unguium: Secondary | ICD-10-CM

## 2020-02-10 DIAGNOSIS — M79675 Pain in left toe(s): Secondary | ICD-10-CM | POA: Diagnosis not present

## 2020-02-10 DIAGNOSIS — M79674 Pain in right toe(s): Secondary | ICD-10-CM | POA: Diagnosis not present

## 2020-02-10 DIAGNOSIS — M129 Arthropathy, unspecified: Secondary | ICD-10-CM

## 2020-02-10 NOTE — Progress Notes (Signed)
This patient returns to my office for at risk foot care.  This patient requires this care by a professional since this patient will be at risk due to having kidney injury.  This patient is unable to cut nails herself since the patient cannot reach her nails.These nails are painful walking and wearing shoes.  This patient presents for at risk foot care today.  General Appearance  Alert, conversant and in no acute stress.  Vascular  Dorsalis pedis and posterior tibial  pulses are weakly  palpable  bilaterally.  Capillary return is within normal limits  bilaterally.  Cold feet right.  Increased temperature left foot.  Neurologic  Senn-Weinstein monofilament wire test within normal limits  bilaterally. Muscle power within normal limits bilaterally.  Nails Thick disfigured discolored nails with subungual debris  from hallux to fifth toes bilaterally. No evidence of bacterial infection or drainage bilaterally.  Orthopedic  No limitations of motion  feet .  No crepitus or effusions noted.  No bony pathology or digital deformities noted. Mild arthritic deformity rearfoot right.  Severe breakdown medial border rearfoot.  Patient has bony prominence plantarly .  Skin  normotropic skin with no porokeratosis noted bilaterally.  No signs of infections or ulcers noted. Diffuse callus under left rearfoot arthritis.    Onychomycosis  Pain in right toes  Pain in left toes  Rearfoot Arthritis  B/L.     Mechanical debridement of nails 1-5  bilaterally performed with a nail nipper.  Filed with dremel without incident. Upon leaving she said she wanted relief of the pain on the plantar aspect left foot.  I told her she will need to be seen by the other doctors in the practice for physical therapy recommendations for her painful callus/arthritis.  Patient is to be seen by Dr  Sherryle Lis.   Return office visit  Prn for nail care.                   Told patient to return for periodic foot care and evaluation due to  potential at risk complications.   Gardiner Barefoot DPM

## 2020-02-11 DIAGNOSIS — J309 Allergic rhinitis, unspecified: Secondary | ICD-10-CM | POA: Diagnosis not present

## 2020-02-11 DIAGNOSIS — R070 Pain in throat: Secondary | ICD-10-CM | POA: Diagnosis not present

## 2020-02-11 DIAGNOSIS — R059 Cough, unspecified: Secondary | ICD-10-CM | POA: Diagnosis not present

## 2020-02-11 DIAGNOSIS — R5383 Other fatigue: Secondary | ICD-10-CM | POA: Diagnosis not present

## 2020-02-12 DIAGNOSIS — R5383 Other fatigue: Secondary | ICD-10-CM | POA: Diagnosis not present

## 2020-02-12 DIAGNOSIS — U071 COVID-19: Secondary | ICD-10-CM | POA: Diagnosis not present

## 2020-02-12 DIAGNOSIS — R5081 Fever presenting with conditions classified elsewhere: Secondary | ICD-10-CM | POA: Diagnosis not present

## 2020-02-12 DIAGNOSIS — I482 Chronic atrial fibrillation, unspecified: Secondary | ICD-10-CM | POA: Diagnosis not present

## 2020-02-12 DIAGNOSIS — I2510 Atherosclerotic heart disease of native coronary artery without angina pectoris: Secondary | ICD-10-CM | POA: Diagnosis not present

## 2020-02-16 DIAGNOSIS — U071 COVID-19: Secondary | ICD-10-CM | POA: Diagnosis not present

## 2020-02-16 DIAGNOSIS — R070 Pain in throat: Secondary | ICD-10-CM | POA: Diagnosis not present

## 2020-02-22 ENCOUNTER — Other Ambulatory Visit: Payer: Self-pay

## 2020-02-22 ENCOUNTER — Ambulatory Visit: Payer: Medicare HMO | Admitting: Podiatry

## 2020-02-22 ENCOUNTER — Encounter: Payer: Self-pay | Admitting: Podiatry

## 2020-02-22 DIAGNOSIS — M76822 Posterior tibial tendinitis, left leg: Secondary | ICD-10-CM

## 2020-02-22 DIAGNOSIS — M722 Plantar fascial fibromatosis: Secondary | ICD-10-CM | POA: Diagnosis not present

## 2020-02-22 DIAGNOSIS — U071 COVID-19: Secondary | ICD-10-CM | POA: Diagnosis not present

## 2020-02-22 DIAGNOSIS — M2142 Flat foot [pes planus] (acquired), left foot: Secondary | ICD-10-CM

## 2020-02-22 DIAGNOSIS — R059 Cough, unspecified: Secondary | ICD-10-CM | POA: Diagnosis not present

## 2020-02-22 NOTE — Progress Notes (Signed)
  Subjective:  Patient ID: [name], female    DOB: [birth year],  MRN: [id number]  Chief Complaint  Patient presents with  . Callouses    Left foot painful callus     87 y.o. female presents with the above complaint. History confirmed with patient. She was referred to me by Dr Prudence Davidson.   Objective:  Physical Exam: warm, good capillary refill, normal DP and PT pulses and normal sensory exam. Left foot: venous insufficiency noted. 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 Plantar fasciitis of left foot   2. Posterior tibial tendinitis of left lower extremity   3. Pes planus of left foot   4. Posterior tibial tendon dysfunction (PTTD) of left lower extremity      Plan:  Patient was evaluated and treated and all questions answered.  She has severe stage IV AAFD. At her age there are no reasonable surgical considerations. She would benefit most from bracing and support to make ambulation comfortable. She says she already has a brace at home and would like to wear that. She also says that previously in [place] she was treated with EPAT for her pain in the arch and along the tendon and this was very helpful. She would like to try this again. I discussed the pros and cons of this and that hopefully it could reduce inflammation but due to her severe deformity it will likely return at some point. She understands this and would like to proceed  No follow-ups on file.

## 2020-02-22 NOTE — Patient Instructions (Signed)
Bring your foot/ankle brace you have at home to your next appointment

## 2020-02-27 DIAGNOSIS — M8580 Other specified disorders of bone density and structure, unspecified site: Secondary | ICD-10-CM | POA: Diagnosis not present

## 2020-02-27 DIAGNOSIS — I10 Essential (primary) hypertension: Secondary | ICD-10-CM | POA: Diagnosis not present

## 2020-03-02 DIAGNOSIS — M79602 Pain in left arm: Secondary | ICD-10-CM | POA: Diagnosis not present

## 2020-03-02 DIAGNOSIS — Z1152 Encounter for screening for COVID-19: Secondary | ICD-10-CM | POA: Diagnosis not present

## 2020-03-02 DIAGNOSIS — J309 Allergic rhinitis, unspecified: Secondary | ICD-10-CM | POA: Diagnosis not present

## 2020-03-02 DIAGNOSIS — Z299 Encounter for prophylactic measures, unspecified: Secondary | ICD-10-CM | POA: Diagnosis not present

## 2020-03-02 DIAGNOSIS — T7840XS Allergy, unspecified, sequela: Secondary | ICD-10-CM | POA: Diagnosis not present

## 2020-03-02 DIAGNOSIS — M6281 Muscle weakness (generalized): Secondary | ICD-10-CM | POA: Diagnosis not present

## 2020-03-02 DIAGNOSIS — H1045 Other chronic allergic conjunctivitis: Secondary | ICD-10-CM | POA: Diagnosis not present

## 2020-03-03 DIAGNOSIS — M80052D Age-related osteoporosis with current pathological fracture, left femur, subsequent encounter for fracture with routine healing: Secondary | ICD-10-CM | POA: Diagnosis not present

## 2020-03-03 DIAGNOSIS — Z8616 Personal history of COVID-19: Secondary | ICD-10-CM | POA: Diagnosis not present

## 2020-03-03 DIAGNOSIS — M25571 Pain in right ankle and joints of right foot: Secondary | ICD-10-CM | POA: Diagnosis not present

## 2020-03-03 DIAGNOSIS — M6281 Muscle weakness (generalized): Secondary | ICD-10-CM | POA: Diagnosis not present

## 2020-03-04 DIAGNOSIS — M62838 Other muscle spasm: Secondary | ICD-10-CM | POA: Diagnosis not present

## 2020-03-04 DIAGNOSIS — M6281 Muscle weakness (generalized): Secondary | ICD-10-CM | POA: Diagnosis not present

## 2020-03-04 DIAGNOSIS — H1045 Other chronic allergic conjunctivitis: Secondary | ICD-10-CM | POA: Diagnosis not present

## 2020-03-04 DIAGNOSIS — J309 Allergic rhinitis, unspecified: Secondary | ICD-10-CM | POA: Diagnosis not present

## 2020-03-04 DIAGNOSIS — M80052D Age-related osteoporosis with current pathological fracture, left femur, subsequent encounter for fracture with routine healing: Secondary | ICD-10-CM | POA: Diagnosis not present

## 2020-03-04 DIAGNOSIS — I10 Essential (primary) hypertension: Secondary | ICD-10-CM | POA: Diagnosis not present

## 2020-03-07 DIAGNOSIS — Z299 Encounter for prophylactic measures, unspecified: Secondary | ICD-10-CM | POA: Diagnosis not present

## 2020-03-07 DIAGNOSIS — J309 Allergic rhinitis, unspecified: Secondary | ICD-10-CM | POA: Diagnosis not present

## 2020-03-07 DIAGNOSIS — T7840XS Allergy, unspecified, sequela: Secondary | ICD-10-CM | POA: Diagnosis not present

## 2020-03-07 DIAGNOSIS — M6281 Muscle weakness (generalized): Secondary | ICD-10-CM | POA: Diagnosis not present

## 2020-03-07 DIAGNOSIS — H1045 Other chronic allergic conjunctivitis: Secondary | ICD-10-CM | POA: Diagnosis not present

## 2020-03-07 DIAGNOSIS — Z79899 Other long term (current) drug therapy: Secondary | ICD-10-CM | POA: Diagnosis not present

## 2020-03-07 DIAGNOSIS — M79602 Pain in left arm: Secondary | ICD-10-CM | POA: Diagnosis not present

## 2020-03-07 DIAGNOSIS — Z1152 Encounter for screening for COVID-19: Secondary | ICD-10-CM | POA: Diagnosis not present

## 2020-03-08 DIAGNOSIS — T7840XS Allergy, unspecified, sequela: Secondary | ICD-10-CM | POA: Diagnosis not present

## 2020-03-08 DIAGNOSIS — Z299 Encounter for prophylactic measures, unspecified: Secondary | ICD-10-CM | POA: Diagnosis not present

## 2020-03-08 DIAGNOSIS — R197 Diarrhea, unspecified: Secondary | ICD-10-CM | POA: Diagnosis not present

## 2020-03-08 DIAGNOSIS — H1045 Other chronic allergic conjunctivitis: Secondary | ICD-10-CM | POA: Diagnosis not present

## 2020-03-08 DIAGNOSIS — R69 Illness, unspecified: Secondary | ICD-10-CM | POA: Diagnosis not present

## 2020-03-08 DIAGNOSIS — J309 Allergic rhinitis, unspecified: Secondary | ICD-10-CM | POA: Diagnosis not present

## 2020-03-08 DIAGNOSIS — Z1152 Encounter for screening for COVID-19: Secondary | ICD-10-CM | POA: Diagnosis not present

## 2020-03-08 DIAGNOSIS — M79602 Pain in left arm: Secondary | ICD-10-CM | POA: Diagnosis not present

## 2020-03-08 DIAGNOSIS — M6281 Muscle weakness (generalized): Secondary | ICD-10-CM | POA: Diagnosis not present

## 2020-03-11 ENCOUNTER — Ambulatory Visit (INDEPENDENT_AMBULATORY_CARE_PROVIDER_SITE_OTHER): Payer: Medicare HMO

## 2020-03-11 ENCOUNTER — Other Ambulatory Visit: Payer: Self-pay

## 2020-03-11 DIAGNOSIS — J309 Allergic rhinitis, unspecified: Secondary | ICD-10-CM | POA: Diagnosis not present

## 2020-03-11 DIAGNOSIS — Z299 Encounter for prophylactic measures, unspecified: Secondary | ICD-10-CM | POA: Diagnosis not present

## 2020-03-11 DIAGNOSIS — Z1152 Encounter for screening for COVID-19: Secondary | ICD-10-CM | POA: Diagnosis not present

## 2020-03-11 DIAGNOSIS — R197 Diarrhea, unspecified: Secondary | ICD-10-CM | POA: Diagnosis not present

## 2020-03-11 DIAGNOSIS — B351 Tinea unguium: Secondary | ICD-10-CM

## 2020-03-11 DIAGNOSIS — M722 Plantar fascial fibromatosis: Secondary | ICD-10-CM

## 2020-03-11 DIAGNOSIS — H1045 Other chronic allergic conjunctivitis: Secondary | ICD-10-CM | POA: Diagnosis not present

## 2020-03-11 DIAGNOSIS — T7840XS Allergy, unspecified, sequela: Secondary | ICD-10-CM | POA: Diagnosis not present

## 2020-03-11 DIAGNOSIS — M6281 Muscle weakness (generalized): Secondary | ICD-10-CM | POA: Diagnosis not present

## 2020-03-11 DIAGNOSIS — M79602 Pain in left arm: Secondary | ICD-10-CM | POA: Diagnosis not present

## 2020-03-11 NOTE — Progress Notes (Signed)
Patient presents for the 1st EPAT treatment today with complaint of left heel and medial arch pain. Diagnosed with Plantar Fasciitis by Dr. Sherryle Lis. This has been ongoing for several months. The patient has tried ice, stretching, NSAIDS and supportive shoe gear with no long term relief.   Most of the pain is located Left heel and medial arch .  ESWT administered and tolerated well.Treatment settings initiated at:   Energy: 15  Ended treatment session today with 3000 shocks at the following settings:   Energy: 15  Frequency: 6.0  Joules: 14.72   Reviewed post EPAT instructions. Advised to avoid ice and NSAIDs throughout the treatment process and to utilize boot or supportive shoes for at least the next 3 days.  Follow up for #2 treatment in 1 week.

## 2020-03-11 NOTE — Patient Instructions (Signed)

## 2020-03-14 DIAGNOSIS — R197 Diarrhea, unspecified: Secondary | ICD-10-CM | POA: Diagnosis not present

## 2020-03-14 DIAGNOSIS — T7840XS Allergy, unspecified, sequela: Secondary | ICD-10-CM | POA: Diagnosis not present

## 2020-03-14 DIAGNOSIS — J309 Allergic rhinitis, unspecified: Secondary | ICD-10-CM | POA: Diagnosis not present

## 2020-03-14 DIAGNOSIS — Z299 Encounter for prophylactic measures, unspecified: Secondary | ICD-10-CM | POA: Diagnosis not present

## 2020-03-14 DIAGNOSIS — Z1152 Encounter for screening for COVID-19: Secondary | ICD-10-CM | POA: Diagnosis not present

## 2020-03-14 DIAGNOSIS — M79602 Pain in left arm: Secondary | ICD-10-CM | POA: Diagnosis not present

## 2020-03-14 DIAGNOSIS — H1045 Other chronic allergic conjunctivitis: Secondary | ICD-10-CM | POA: Diagnosis not present

## 2020-03-14 DIAGNOSIS — M6281 Muscle weakness (generalized): Secondary | ICD-10-CM | POA: Diagnosis not present

## 2020-03-15 DIAGNOSIS — M79602 Pain in left arm: Secondary | ICD-10-CM | POA: Diagnosis not present

## 2020-03-15 DIAGNOSIS — R197 Diarrhea, unspecified: Secondary | ICD-10-CM | POA: Diagnosis not present

## 2020-03-15 DIAGNOSIS — Z1152 Encounter for screening for COVID-19: Secondary | ICD-10-CM | POA: Diagnosis not present

## 2020-03-15 DIAGNOSIS — H1045 Other chronic allergic conjunctivitis: Secondary | ICD-10-CM | POA: Diagnosis not present

## 2020-03-15 DIAGNOSIS — T7840XS Allergy, unspecified, sequela: Secondary | ICD-10-CM | POA: Diagnosis not present

## 2020-03-15 DIAGNOSIS — M6281 Muscle weakness (generalized): Secondary | ICD-10-CM | POA: Diagnosis not present

## 2020-03-15 DIAGNOSIS — J309 Allergic rhinitis, unspecified: Secondary | ICD-10-CM | POA: Diagnosis not present

## 2020-03-15 DIAGNOSIS — Z299 Encounter for prophylactic measures, unspecified: Secondary | ICD-10-CM | POA: Diagnosis not present

## 2020-03-16 DIAGNOSIS — H1013 Acute atopic conjunctivitis, bilateral: Secondary | ICD-10-CM | POA: Diagnosis not present

## 2020-03-18 ENCOUNTER — Other Ambulatory Visit: Payer: Medicare HMO

## 2020-03-18 DIAGNOSIS — M6281 Muscle weakness (generalized): Secondary | ICD-10-CM | POA: Diagnosis not present

## 2020-03-18 DIAGNOSIS — Z1152 Encounter for screening for COVID-19: Secondary | ICD-10-CM | POA: Diagnosis not present

## 2020-03-18 DIAGNOSIS — Z299 Encounter for prophylactic measures, unspecified: Secondary | ICD-10-CM | POA: Diagnosis not present

## 2020-03-18 DIAGNOSIS — J309 Allergic rhinitis, unspecified: Secondary | ICD-10-CM | POA: Diagnosis not present

## 2020-03-18 DIAGNOSIS — H1045 Other chronic allergic conjunctivitis: Secondary | ICD-10-CM | POA: Diagnosis not present

## 2020-03-18 DIAGNOSIS — R197 Diarrhea, unspecified: Secondary | ICD-10-CM | POA: Diagnosis not present

## 2020-03-18 DIAGNOSIS — M79602 Pain in left arm: Secondary | ICD-10-CM | POA: Diagnosis not present

## 2020-03-18 DIAGNOSIS — T7840XS Allergy, unspecified, sequela: Secondary | ICD-10-CM | POA: Diagnosis not present

## 2020-03-21 ENCOUNTER — Other Ambulatory Visit: Payer: Self-pay

## 2020-03-21 ENCOUNTER — Ambulatory Visit (INDEPENDENT_AMBULATORY_CARE_PROVIDER_SITE_OTHER): Payer: Medicare HMO

## 2020-03-21 DIAGNOSIS — T7840XS Allergy, unspecified, sequela: Secondary | ICD-10-CM | POA: Diagnosis not present

## 2020-03-21 DIAGNOSIS — Z1152 Encounter for screening for COVID-19: Secondary | ICD-10-CM | POA: Diagnosis not present

## 2020-03-21 DIAGNOSIS — R197 Diarrhea, unspecified: Secondary | ICD-10-CM | POA: Diagnosis not present

## 2020-03-21 DIAGNOSIS — J309 Allergic rhinitis, unspecified: Secondary | ICD-10-CM | POA: Diagnosis not present

## 2020-03-21 DIAGNOSIS — B351 Tinea unguium: Secondary | ICD-10-CM

## 2020-03-21 DIAGNOSIS — M6281 Muscle weakness (generalized): Secondary | ICD-10-CM | POA: Diagnosis not present

## 2020-03-21 DIAGNOSIS — M79602 Pain in left arm: Secondary | ICD-10-CM | POA: Diagnosis not present

## 2020-03-21 DIAGNOSIS — H1045 Other chronic allergic conjunctivitis: Secondary | ICD-10-CM | POA: Diagnosis not present

## 2020-03-21 DIAGNOSIS — Z299 Encounter for prophylactic measures, unspecified: Secondary | ICD-10-CM | POA: Diagnosis not present

## 2020-03-21 DIAGNOSIS — M722 Plantar fascial fibromatosis: Secondary | ICD-10-CM

## 2020-03-21 NOTE — Progress Notes (Signed)
Patient presents for the 2nd EPAT treatment today with complaint of left heel and medial arch pain. Diagnosed with Plantar Fasciitis by Dr. Sherryle Lis. This has been ongoing for several months. The patient has tried ice, stretching, NSAIDS and supportive shoe gear with no long term relief.   Most of the pain is located Left heel and medial arch . Patient states some improvement in discomfort since last visit.   ESWT administered and tolerated well.Treatment settings initiated at:   Energy: 20  Ended treatment session today with 3000 shocks at the following settings:   Energy: 20  Frequency: 5.0  Joules: 19.62   Reviewed post EPAT instructions. Advised to avoid ice and NSAIDs throughout the treatment process and to utilize boot or supportive shoes for at least the next 3 days.  Follow up for #3 treatment in 1 week.

## 2020-03-24 DIAGNOSIS — T7840XS Allergy, unspecified, sequela: Secondary | ICD-10-CM | POA: Diagnosis not present

## 2020-03-24 DIAGNOSIS — R197 Diarrhea, unspecified: Secondary | ICD-10-CM | POA: Diagnosis not present

## 2020-03-24 DIAGNOSIS — M6281 Muscle weakness (generalized): Secondary | ICD-10-CM | POA: Diagnosis not present

## 2020-03-24 DIAGNOSIS — M79602 Pain in left arm: Secondary | ICD-10-CM | POA: Diagnosis not present

## 2020-03-24 DIAGNOSIS — H1045 Other chronic allergic conjunctivitis: Secondary | ICD-10-CM | POA: Diagnosis not present

## 2020-03-24 DIAGNOSIS — Z299 Encounter for prophylactic measures, unspecified: Secondary | ICD-10-CM | POA: Diagnosis not present

## 2020-03-24 DIAGNOSIS — Z1152 Encounter for screening for COVID-19: Secondary | ICD-10-CM | POA: Diagnosis not present

## 2020-03-24 DIAGNOSIS — J309 Allergic rhinitis, unspecified: Secondary | ICD-10-CM | POA: Diagnosis not present

## 2020-03-28 DIAGNOSIS — R197 Diarrhea, unspecified: Secondary | ICD-10-CM | POA: Diagnosis not present

## 2020-03-28 DIAGNOSIS — H1045 Other chronic allergic conjunctivitis: Secondary | ICD-10-CM | POA: Diagnosis not present

## 2020-03-28 DIAGNOSIS — M79602 Pain in left arm: Secondary | ICD-10-CM | POA: Diagnosis not present

## 2020-03-28 DIAGNOSIS — M6281 Muscle weakness (generalized): Secondary | ICD-10-CM | POA: Diagnosis not present

## 2020-03-28 DIAGNOSIS — Z1152 Encounter for screening for COVID-19: Secondary | ICD-10-CM | POA: Diagnosis not present

## 2020-03-28 DIAGNOSIS — T7840XS Allergy, unspecified, sequela: Secondary | ICD-10-CM | POA: Diagnosis not present

## 2020-03-28 DIAGNOSIS — J309 Allergic rhinitis, unspecified: Secondary | ICD-10-CM | POA: Diagnosis not present

## 2020-03-28 DIAGNOSIS — Z299 Encounter for prophylactic measures, unspecified: Secondary | ICD-10-CM | POA: Diagnosis not present

## 2020-03-29 ENCOUNTER — Other Ambulatory Visit: Payer: Self-pay

## 2020-03-29 ENCOUNTER — Ambulatory Visit (INDEPENDENT_AMBULATORY_CARE_PROVIDER_SITE_OTHER): Payer: Medicare HMO

## 2020-03-29 DIAGNOSIS — M6281 Muscle weakness (generalized): Secondary | ICD-10-CM | POA: Diagnosis not present

## 2020-03-29 DIAGNOSIS — T7840XS Allergy, unspecified, sequela: Secondary | ICD-10-CM | POA: Diagnosis not present

## 2020-03-29 DIAGNOSIS — J309 Allergic rhinitis, unspecified: Secondary | ICD-10-CM | POA: Diagnosis not present

## 2020-03-29 DIAGNOSIS — M722 Plantar fascial fibromatosis: Secondary | ICD-10-CM

## 2020-03-29 DIAGNOSIS — Z299 Encounter for prophylactic measures, unspecified: Secondary | ICD-10-CM | POA: Diagnosis not present

## 2020-03-29 DIAGNOSIS — Z1152 Encounter for screening for COVID-19: Secondary | ICD-10-CM | POA: Diagnosis not present

## 2020-03-29 DIAGNOSIS — R197 Diarrhea, unspecified: Secondary | ICD-10-CM | POA: Diagnosis not present

## 2020-03-29 DIAGNOSIS — B351 Tinea unguium: Secondary | ICD-10-CM

## 2020-03-29 DIAGNOSIS — M79602 Pain in left arm: Secondary | ICD-10-CM | POA: Diagnosis not present

## 2020-03-29 DIAGNOSIS — H1045 Other chronic allergic conjunctivitis: Secondary | ICD-10-CM | POA: Diagnosis not present

## 2020-03-29 NOTE — Progress Notes (Signed)
Patient presents for the 3rd EPAT treatment today with complaint of left heel and medial arch pain. Diagnosed with Plantar Fasciitis by Dr. Sherryle Lis. This has been ongoing for several months. The patient has tried ice, stretching, NSAIDS and supportive shoe gear with no long term relief.   Most of the pain is located Left heel and medial arch . Patient states some improvement in discomfort since last visit.   ESWT administered and tolerated well.Treatment settings initiated at:   Energy: 25  Ended treatment session today with 3000 shocks at the following settings:   Energy: 25  Frequency: 4.0  Joules: 24.52   Reviewed post EPAT instructions. Advised to avoid ice and NSAIDs throughout the treatment process and to utilize boot or supportive shoes for at least the next 3 days.  Follow up for #4 treatment in 2 weeks.

## 2020-03-30 DIAGNOSIS — H1045 Other chronic allergic conjunctivitis: Secondary | ICD-10-CM | POA: Diagnosis not present

## 2020-03-30 DIAGNOSIS — R197 Diarrhea, unspecified: Secondary | ICD-10-CM | POA: Diagnosis not present

## 2020-03-30 DIAGNOSIS — M79602 Pain in left arm: Secondary | ICD-10-CM | POA: Diagnosis not present

## 2020-03-30 DIAGNOSIS — M6281 Muscle weakness (generalized): Secondary | ICD-10-CM | POA: Diagnosis not present

## 2020-03-30 DIAGNOSIS — J309 Allergic rhinitis, unspecified: Secondary | ICD-10-CM | POA: Diagnosis not present

## 2020-03-30 DIAGNOSIS — Z299 Encounter for prophylactic measures, unspecified: Secondary | ICD-10-CM | POA: Diagnosis not present

## 2020-03-30 DIAGNOSIS — Z1152 Encounter for screening for COVID-19: Secondary | ICD-10-CM | POA: Diagnosis not present

## 2020-03-30 DIAGNOSIS — T7840XS Allergy, unspecified, sequela: Secondary | ICD-10-CM | POA: Diagnosis not present

## 2020-04-01 DIAGNOSIS — R197 Diarrhea, unspecified: Secondary | ICD-10-CM | POA: Diagnosis not present

## 2020-04-04 DIAGNOSIS — S81802A Unspecified open wound, left lower leg, initial encounter: Secondary | ICD-10-CM | POA: Diagnosis not present

## 2020-04-04 DIAGNOSIS — K5900 Constipation, unspecified: Secondary | ICD-10-CM | POA: Diagnosis not present

## 2020-04-05 DIAGNOSIS — M2142 Flat foot [pes planus] (acquired), left foot: Secondary | ICD-10-CM | POA: Diagnosis not present

## 2020-04-05 DIAGNOSIS — M79674 Pain in right toe(s): Secondary | ICD-10-CM | POA: Diagnosis not present

## 2020-04-05 DIAGNOSIS — M2042 Other hammer toe(s) (acquired), left foot: Secondary | ICD-10-CM | POA: Diagnosis not present

## 2020-04-05 DIAGNOSIS — R262 Difficulty in walking, not elsewhere classified: Secondary | ICD-10-CM | POA: Diagnosis not present

## 2020-04-05 DIAGNOSIS — I739 Peripheral vascular disease, unspecified: Secondary | ICD-10-CM | POA: Diagnosis not present

## 2020-04-05 DIAGNOSIS — B351 Tinea unguium: Secondary | ICD-10-CM | POA: Diagnosis not present

## 2020-04-05 DIAGNOSIS — M2141 Flat foot [pes planus] (acquired), right foot: Secondary | ICD-10-CM | POA: Diagnosis not present

## 2020-04-05 DIAGNOSIS — L84 Corns and callosities: Secondary | ICD-10-CM | POA: Diagnosis not present

## 2020-04-05 DIAGNOSIS — M2041 Other hammer toe(s) (acquired), right foot: Secondary | ICD-10-CM | POA: Diagnosis not present

## 2020-04-12 DIAGNOSIS — I10 Essential (primary) hypertension: Secondary | ICD-10-CM | POA: Diagnosis not present

## 2020-04-12 DIAGNOSIS — M8580 Other specified disorders of bone density and structure, unspecified site: Secondary | ICD-10-CM | POA: Diagnosis not present

## 2020-04-15 DIAGNOSIS — H5789 Other specified disorders of eye and adnexa: Secondary | ICD-10-CM | POA: Diagnosis not present

## 2020-04-15 DIAGNOSIS — H1031 Unspecified acute conjunctivitis, right eye: Secondary | ICD-10-CM | POA: Diagnosis not present

## 2020-04-18 ENCOUNTER — Ambulatory Visit: Payer: Medicare HMO | Admitting: Podiatry

## 2020-04-19 ENCOUNTER — Ambulatory Visit (INDEPENDENT_AMBULATORY_CARE_PROVIDER_SITE_OTHER): Payer: Medicare HMO

## 2020-04-19 ENCOUNTER — Other Ambulatory Visit: Payer: Self-pay

## 2020-04-19 ENCOUNTER — Ambulatory Visit: Payer: Medicare HMO | Admitting: Podiatry

## 2020-04-19 DIAGNOSIS — M76822 Posterior tibial tendinitis, left leg: Secondary | ICD-10-CM

## 2020-04-19 DIAGNOSIS — M2142 Flat foot [pes planus] (acquired), left foot: Secondary | ICD-10-CM

## 2020-04-19 DIAGNOSIS — B351 Tinea unguium: Secondary | ICD-10-CM

## 2020-04-19 DIAGNOSIS — M722 Plantar fascial fibromatosis: Secondary | ICD-10-CM

## 2020-04-19 NOTE — Progress Notes (Signed)
Patient presents for the 3rd EPAT treatment today with complaint of left heel and medial arch pain. Diagnosed with Plantar Fasciitis by Dr. Sherryle Lis. This has been ongoing for several months. The patient has tried ice, stretching, NSAIDS and supportive shoe gear with no long term relief.   Most of the pain is located Left heel and medial arch . Patient states some improvement in discomfort since last visit. Patient states that she is able to walk better with shoes on now.   ESWT administered and tolerated well.Treatment settings initiated at:   Energy: 30  Ended treatment session today with 3000 shocks at the following settings:   Energy: 30  Frequency: 4.0  Joules: 29.43   Reviewed post EPAT instructions. Advised to avoid ice and NSAIDs throughout the treatment process and to utilize boot or supportive shoes for at least the next 3 days.  EPAT completed today. Follow-up with Dr. Sherryle Lis today.

## 2020-04-20 NOTE — Progress Notes (Signed)
  Subjective:  Patient ID: [name], female    DOB: [birth year],  MRN: [id number]  Chief Complaint  Patient presents with  . Follow-up    Pt states left foot pain has improved. No new issues at this time.     87 y.o. female returns for follow-up with the above complaint. History confirmed with patient.  She is doing much better status post EPAT treatment and has no pain  Objective:  Physical Exam: warm, good capillary refill, normal DP and PT pulses and normal sensory exam. Left foot: venous insufficiency noted. 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  No pain on palpation range of motion  Assessment:   1. Plantar fasciitis of left foot   2. Posterior tibial tendinitis of left lower extremity   3. Pes planus of left foot   4. Posterior tibial tendon dysfunction (PTTD) of left lower extremity      Plan:  Patient was evaluated and treated and all questions answered.  Doing quite well.  I discussed with her that this likely could return at some point due to her severe deformity.  If it comes back we will repeat EPAT  Return if symptoms worsen or fail to improve.

## 2020-05-02 DIAGNOSIS — H02052 Trichiasis without entropian right lower eyelid: Secondary | ICD-10-CM | POA: Diagnosis not present

## 2020-05-02 DIAGNOSIS — H02055 Trichiasis without entropian left lower eyelid: Secondary | ICD-10-CM | POA: Diagnosis not present

## 2020-05-24 DIAGNOSIS — G5603 Carpal tunnel syndrome, bilateral upper limbs: Secondary | ICD-10-CM | POA: Diagnosis not present

## 2020-05-24 DIAGNOSIS — H1031 Unspecified acute conjunctivitis, right eye: Secondary | ICD-10-CM | POA: Diagnosis not present

## 2020-07-19 DIAGNOSIS — B351 Tinea unguium: Secondary | ICD-10-CM | POA: Diagnosis not present

## 2020-07-19 DIAGNOSIS — I739 Peripheral vascular disease, unspecified: Secondary | ICD-10-CM | POA: Diagnosis not present

## 2020-07-19 DIAGNOSIS — I87323 Chronic venous hypertension (idiopathic) with inflammation of bilateral lower extremity: Secondary | ICD-10-CM | POA: Diagnosis not present

## 2020-08-09 DIAGNOSIS — G8929 Other chronic pain: Secondary | ICD-10-CM | POA: Diagnosis not present

## 2020-08-09 DIAGNOSIS — R54 Age-related physical debility: Secondary | ICD-10-CM | POA: Diagnosis not present

## 2020-08-09 DIAGNOSIS — E559 Vitamin D deficiency, unspecified: Secondary | ICD-10-CM | POA: Diagnosis not present

## 2020-08-09 DIAGNOSIS — Z6827 Body mass index (BMI) 27.0-27.9, adult: Secondary | ICD-10-CM | POA: Diagnosis not present

## 2020-08-09 DIAGNOSIS — I10 Essential (primary) hypertension: Secondary | ICD-10-CM | POA: Diagnosis not present

## 2020-08-09 DIAGNOSIS — K5900 Constipation, unspecified: Secondary | ICD-10-CM | POA: Diagnosis not present

## 2020-08-10 DIAGNOSIS — I482 Chronic atrial fibrillation, unspecified: Secondary | ICD-10-CM | POA: Diagnosis not present

## 2020-08-10 DIAGNOSIS — Z79899 Other long term (current) drug therapy: Secondary | ICD-10-CM | POA: Diagnosis not present

## 2020-08-18 DIAGNOSIS — M8580 Other specified disorders of bone density and structure, unspecified site: Secondary | ICD-10-CM | POA: Diagnosis not present

## 2020-08-18 DIAGNOSIS — I10 Essential (primary) hypertension: Secondary | ICD-10-CM | POA: Diagnosis not present

## 2020-08-26 DIAGNOSIS — N939 Abnormal uterine and vaginal bleeding, unspecified: Secondary | ICD-10-CM | POA: Diagnosis not present

## 2020-08-26 DIAGNOSIS — L299 Pruritus, unspecified: Secondary | ICD-10-CM | POA: Diagnosis not present

## 2020-08-26 DIAGNOSIS — B373 Candidiasis of vulva and vagina: Secondary | ICD-10-CM | POA: Diagnosis not present

## 2020-08-26 DIAGNOSIS — K5900 Constipation, unspecified: Secondary | ICD-10-CM | POA: Diagnosis not present

## 2020-08-29 DIAGNOSIS — R10819 Abdominal tenderness, unspecified site: Secondary | ICD-10-CM | POA: Diagnosis not present

## 2020-08-29 DIAGNOSIS — M79604 Pain in right leg: Secondary | ICD-10-CM | POA: Diagnosis not present

## 2020-08-29 DIAGNOSIS — B373 Candidiasis of vulva and vagina: Secondary | ICD-10-CM | POA: Diagnosis not present

## 2020-08-30 DIAGNOSIS — N390 Urinary tract infection, site not specified: Secondary | ICD-10-CM | POA: Diagnosis not present

## 2020-08-31 DIAGNOSIS — N3001 Acute cystitis with hematuria: Secondary | ICD-10-CM | POA: Diagnosis not present

## 2020-08-31 DIAGNOSIS — M79671 Pain in right foot: Secondary | ICD-10-CM | POA: Diagnosis not present

## 2020-09-06 DIAGNOSIS — H04129 Dry eye syndrome of unspecified lacrimal gland: Secondary | ICD-10-CM | POA: Diagnosis not present

## 2020-09-06 DIAGNOSIS — R6889 Other general symptoms and signs: Secondary | ICD-10-CM | POA: Diagnosis not present

## 2020-09-20 DIAGNOSIS — M65331 Trigger finger, right middle finger: Secondary | ICD-10-CM | POA: Diagnosis not present

## 2020-09-20 DIAGNOSIS — M65352 Trigger finger, left little finger: Secondary | ICD-10-CM | POA: Diagnosis not present

## 2020-09-21 DIAGNOSIS — N3000 Acute cystitis without hematuria: Secondary | ICD-10-CM | POA: Diagnosis not present

## 2020-09-21 DIAGNOSIS — B372 Candidiasis of skin and nail: Secondary | ICD-10-CM | POA: Diagnosis not present

## 2020-09-28 DIAGNOSIS — M25561 Pain in right knee: Secondary | ICD-10-CM | POA: Diagnosis not present

## 2020-09-28 DIAGNOSIS — M170 Bilateral primary osteoarthritis of knee: Secondary | ICD-10-CM | POA: Diagnosis not present

## 2020-09-28 DIAGNOSIS — G8929 Other chronic pain: Secondary | ICD-10-CM | POA: Diagnosis not present

## 2020-10-03 DIAGNOSIS — M79642 Pain in left hand: Secondary | ICD-10-CM | POA: Diagnosis not present

## 2020-10-03 DIAGNOSIS — M65331 Trigger finger, right middle finger: Secondary | ICD-10-CM | POA: Diagnosis not present

## 2020-10-03 DIAGNOSIS — M65312 Trigger thumb, left thumb: Secondary | ICD-10-CM | POA: Diagnosis not present

## 2020-10-03 DIAGNOSIS — G5602 Carpal tunnel syndrome, left upper limb: Secondary | ICD-10-CM | POA: Diagnosis not present

## 2020-10-03 DIAGNOSIS — M65352 Trigger finger, left little finger: Secondary | ICD-10-CM | POA: Diagnosis not present

## 2020-10-03 DIAGNOSIS — M79641 Pain in right hand: Secondary | ICD-10-CM | POA: Diagnosis not present

## 2020-10-04 DIAGNOSIS — Z79899 Other long term (current) drug therapy: Secondary | ICD-10-CM | POA: Diagnosis not present

## 2020-10-04 DIAGNOSIS — N390 Urinary tract infection, site not specified: Secondary | ICD-10-CM | POA: Diagnosis not present

## 2020-10-17 DIAGNOSIS — Z961 Presence of intraocular lens: Secondary | ICD-10-CM | POA: Diagnosis not present

## 2020-10-17 DIAGNOSIS — H02055 Trichiasis without entropian left lower eyelid: Secondary | ICD-10-CM | POA: Diagnosis not present

## 2020-10-17 DIAGNOSIS — H02052 Trichiasis without entropian right lower eyelid: Secondary | ICD-10-CM | POA: Diagnosis not present

## 2020-10-28 DIAGNOSIS — F4321 Adjustment disorder with depressed mood: Secondary | ICD-10-CM | POA: Diagnosis not present

## 2020-11-04 DIAGNOSIS — Z853 Personal history of malignant neoplasm of breast: Secondary | ICD-10-CM | POA: Diagnosis not present

## 2020-11-04 DIAGNOSIS — N939 Abnormal uterine and vaginal bleeding, unspecified: Secondary | ICD-10-CM | POA: Diagnosis not present

## 2020-11-14 DIAGNOSIS — M65331 Trigger finger, right middle finger: Secondary | ICD-10-CM | POA: Diagnosis not present

## 2020-11-14 DIAGNOSIS — M65352 Trigger finger, left little finger: Secondary | ICD-10-CM | POA: Diagnosis not present

## 2020-11-14 DIAGNOSIS — G5602 Carpal tunnel syndrome, left upper limb: Secondary | ICD-10-CM | POA: Diagnosis not present

## 2020-11-14 DIAGNOSIS — M65312 Trigger thumb, left thumb: Secondary | ICD-10-CM | POA: Diagnosis not present

## 2020-11-22 DIAGNOSIS — K5900 Constipation, unspecified: Secondary | ICD-10-CM | POA: Diagnosis not present

## 2020-11-22 DIAGNOSIS — G894 Chronic pain syndrome: Secondary | ICD-10-CM | POA: Diagnosis not present

## 2020-11-22 DIAGNOSIS — Z6827 Body mass index (BMI) 27.0-27.9, adult: Secondary | ICD-10-CM | POA: Diagnosis not present

## 2020-11-22 DIAGNOSIS — E559 Vitamin D deficiency, unspecified: Secondary | ICD-10-CM | POA: Diagnosis not present

## 2020-11-22 DIAGNOSIS — R4589 Other symptoms and signs involving emotional state: Secondary | ICD-10-CM | POA: Diagnosis not present

## 2020-11-22 DIAGNOSIS — Z87440 Personal history of urinary (tract) infections: Secondary | ICD-10-CM | POA: Diagnosis not present

## 2020-11-22 DIAGNOSIS — R2689 Other abnormalities of gait and mobility: Secondary | ICD-10-CM | POA: Diagnosis not present

## 2020-11-22 DIAGNOSIS — I10 Essential (primary) hypertension: Secondary | ICD-10-CM | POA: Diagnosis not present

## 2020-11-22 IMAGING — US LEFT LOWER EXTREMITY SOFT TISSUE ULTRASOUND LIMITED
1 series · 14 of 21 positions shown · non-contrast
Comparison: None.

CLINICAL DATA: Left Baker cyst

EXAM:
ULTRASOUND left LOWER EXTREMITY LIMITED
TECHNIQUE: Ultrasound examination of the lower extremity soft tissues was
performed in the area of clinical concern.

[Series 1: left lower extremity soft tissue ultrasound limite · 0.09mm/px · 21 acquisitions, 14 frames shown]
[im 1/21]
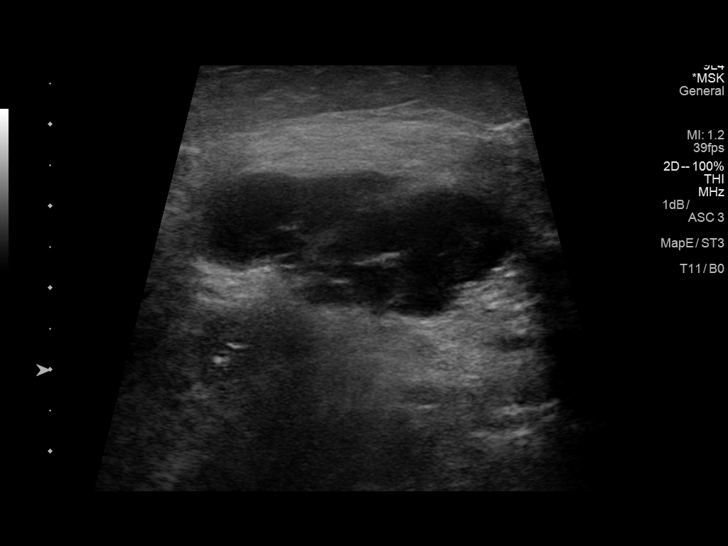
[im 3/21]
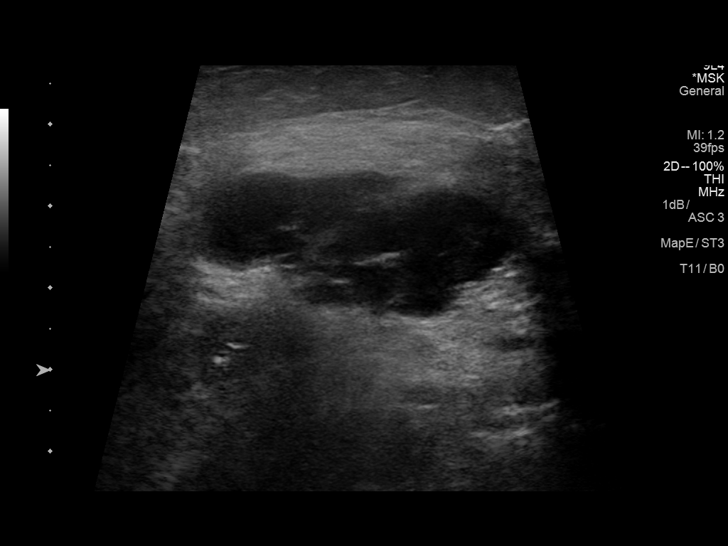
[im 4/21]
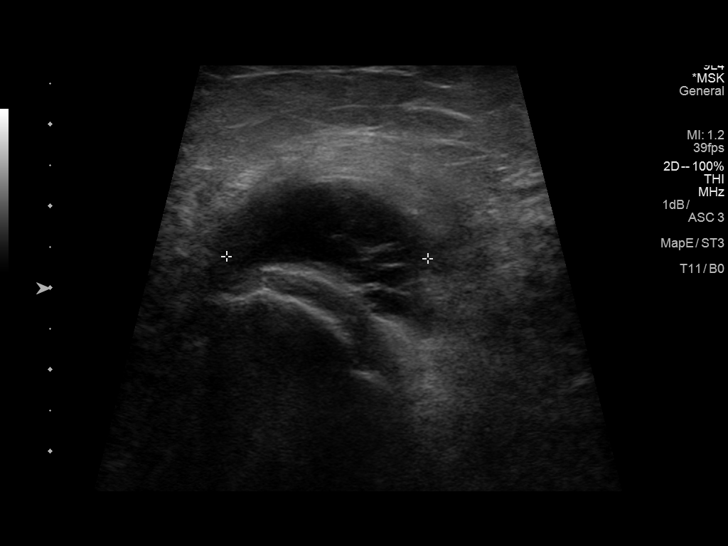
[im 6/21]
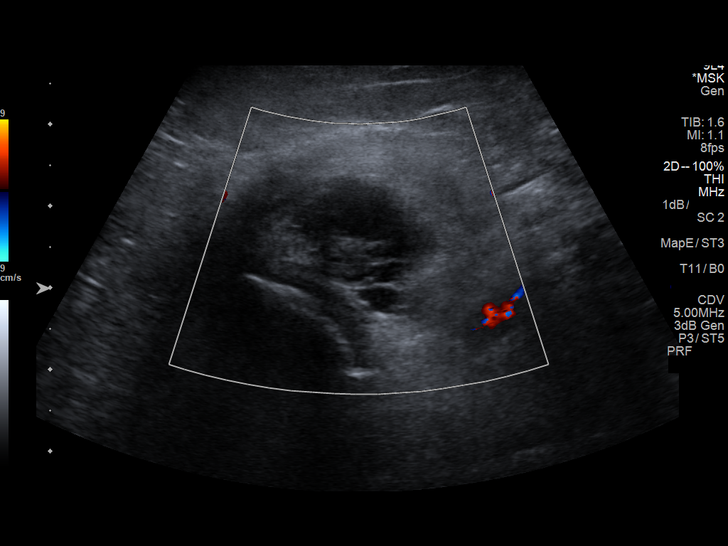
[im 7/21]
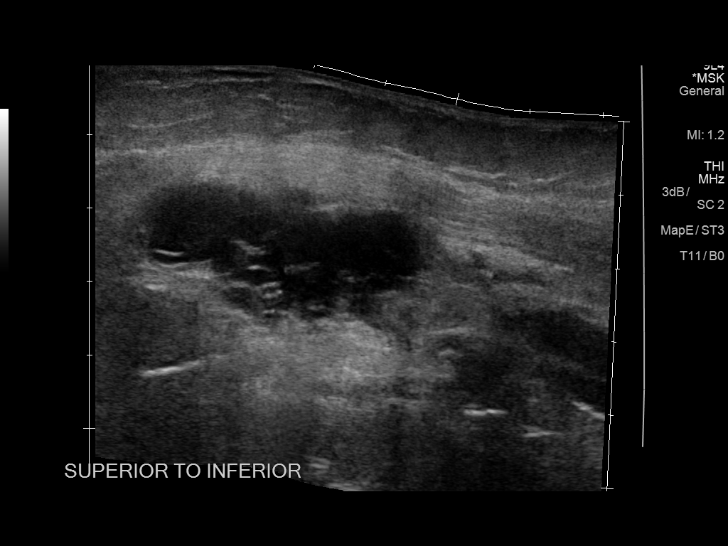
[im 9/21]
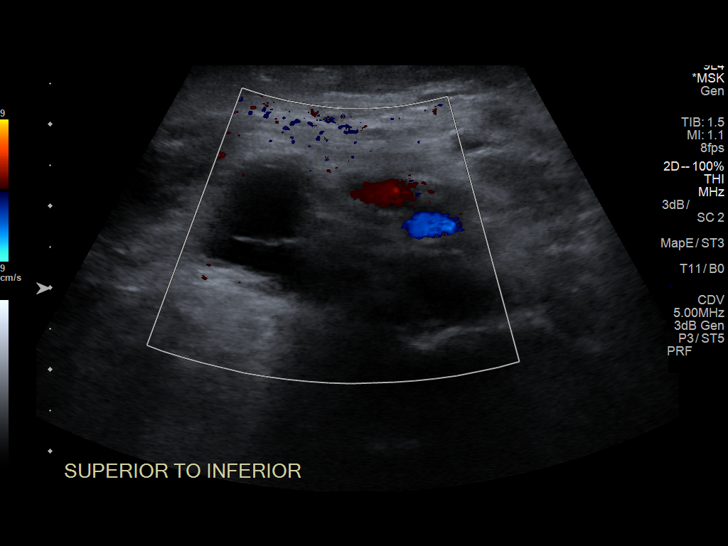
[im 10/21]
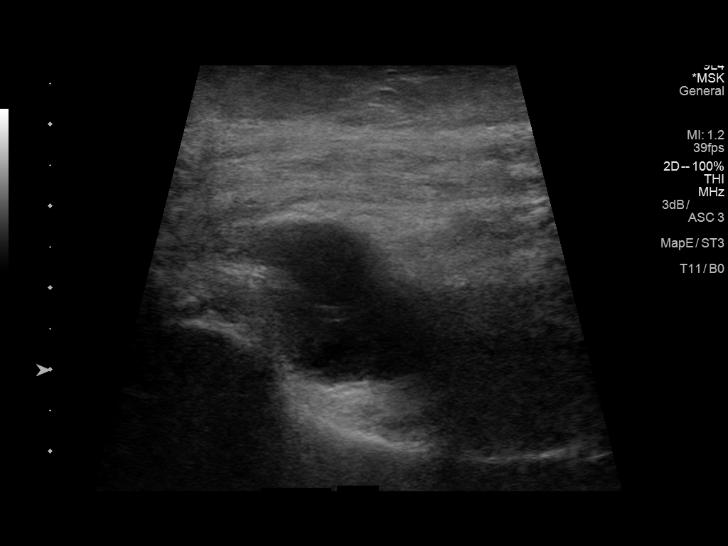
[im 12/21]
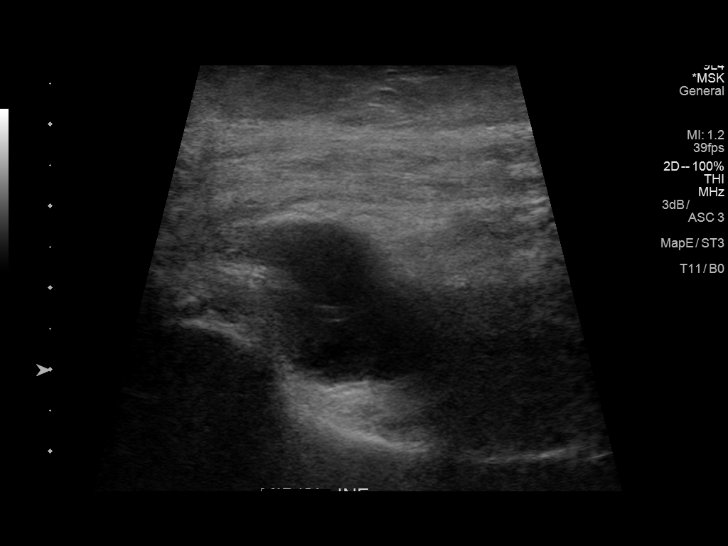
[im 13/21]
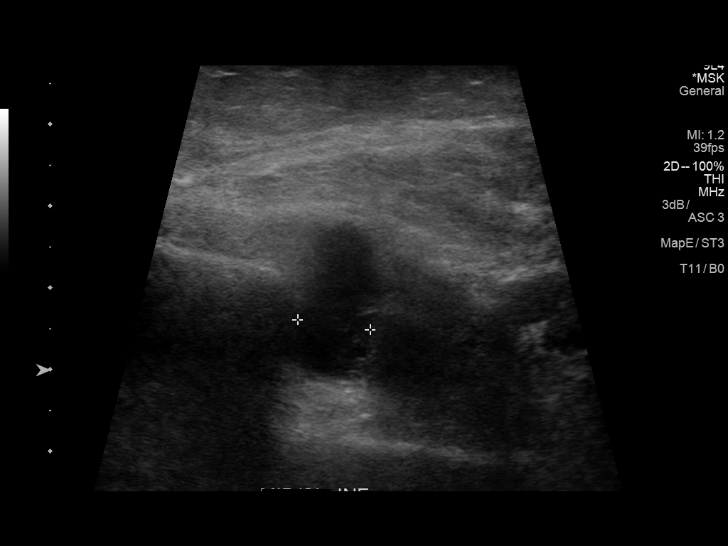
[im 15/21]
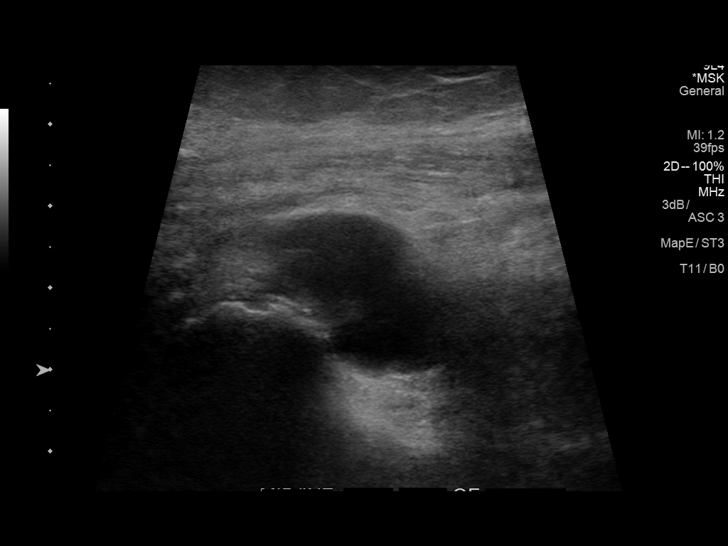
[im 16/21]
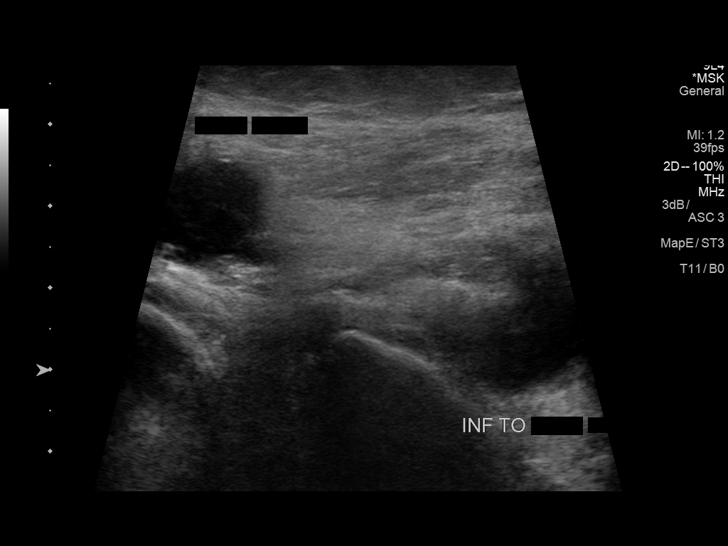
[im 18/21]
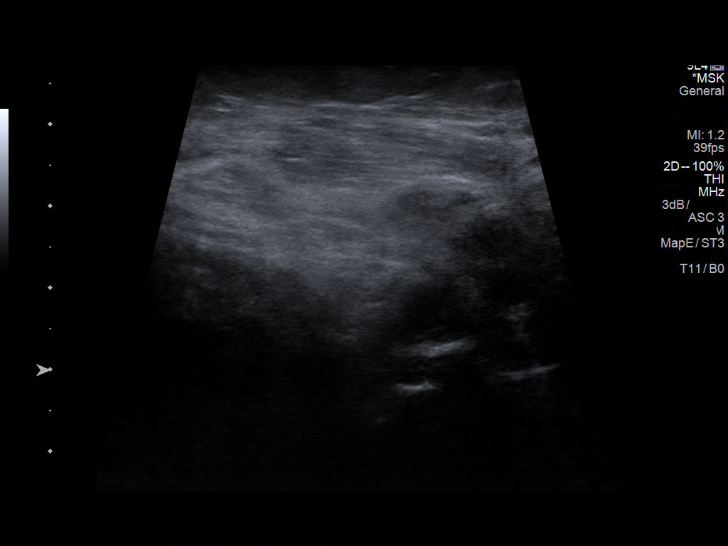
[im 19/21]
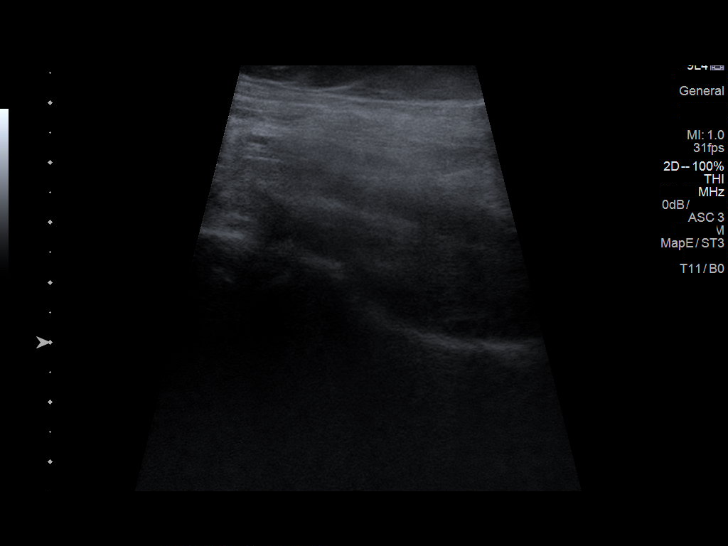
[im 21/21]
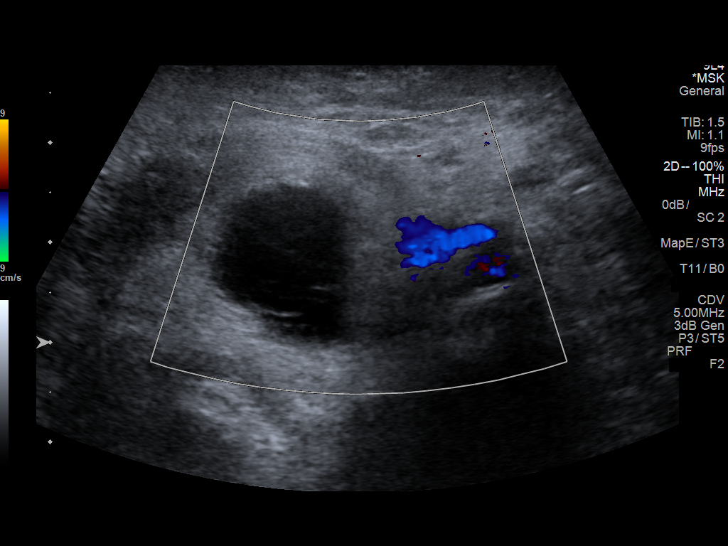

[14 of 21 positions shown; findings below may reference images not displayed]

FINDINGS: Complex cyst popliteal fossa on the left. Palpable abnormality
corresponds to a complex cyst measuring 4.0 x 1.7 x 2.5 cm.
Immediately inferior is an adjacent complex cyst measuring 2.3 x
x 0.9 cm.
IMPRESSION: Complex cyst popliteal fossa compatible with Baker's cyst.

## 2020-11-24 DIAGNOSIS — E559 Vitamin D deficiency, unspecified: Secondary | ICD-10-CM | POA: Diagnosis not present

## 2020-11-24 DIAGNOSIS — E568 Deficiency of other vitamins: Secondary | ICD-10-CM | POA: Diagnosis not present

## 2020-11-24 DIAGNOSIS — Z79899 Other long term (current) drug therapy: Secondary | ICD-10-CM | POA: Diagnosis not present

## 2020-11-24 DIAGNOSIS — I482 Chronic atrial fibrillation, unspecified: Secondary | ICD-10-CM | POA: Diagnosis not present

## 2020-12-12 DIAGNOSIS — H02055 Trichiasis without entropian left lower eyelid: Secondary | ICD-10-CM | POA: Diagnosis not present

## 2020-12-12 DIAGNOSIS — H02052 Trichiasis without entropian right lower eyelid: Secondary | ICD-10-CM | POA: Diagnosis not present

## 2020-12-15 DIAGNOSIS — F4321 Adjustment disorder with depressed mood: Secondary | ICD-10-CM | POA: Diagnosis not present

## 2020-12-15 DIAGNOSIS — L84 Corns and callosities: Secondary | ICD-10-CM | POA: Diagnosis not present

## 2020-12-15 DIAGNOSIS — F418 Other specified anxiety disorders: Secondary | ICD-10-CM | POA: Diagnosis not present

## 2020-12-15 DIAGNOSIS — M65319 Trigger thumb, unspecified thumb: Secondary | ICD-10-CM | POA: Diagnosis not present

## 2020-12-15 DIAGNOSIS — B351 Tinea unguium: Secondary | ICD-10-CM | POA: Diagnosis not present

## 2020-12-15 DIAGNOSIS — L603 Nail dystrophy: Secondary | ICD-10-CM | POA: Diagnosis not present

## 2020-12-15 DIAGNOSIS — I739 Peripheral vascular disease, unspecified: Secondary | ICD-10-CM | POA: Diagnosis not present

## 2020-12-16 DIAGNOSIS — M65312 Trigger thumb, left thumb: Secondary | ICD-10-CM | POA: Diagnosis not present

## 2020-12-19 DIAGNOSIS — M25571 Pain in right ankle and joints of right foot: Secondary | ICD-10-CM | POA: Diagnosis not present

## 2020-12-19 DIAGNOSIS — M7661 Achilles tendinitis, right leg: Secondary | ICD-10-CM | POA: Diagnosis not present

## 2020-12-21 DIAGNOSIS — M25571 Pain in right ankle and joints of right foot: Secondary | ICD-10-CM | POA: Diagnosis not present

## 2020-12-21 DIAGNOSIS — M7661 Achilles tendinitis, right leg: Secondary | ICD-10-CM | POA: Diagnosis not present

## 2021-01-13 DIAGNOSIS — I10 Essential (primary) hypertension: Secondary | ICD-10-CM | POA: Diagnosis not present

## 2021-01-13 DIAGNOSIS — J90 Pleural effusion, not elsewhere classified: Secondary | ICD-10-CM | POA: Diagnosis not present

## 2021-01-13 DIAGNOSIS — J189 Pneumonia, unspecified organism: Secondary | ICD-10-CM | POA: Diagnosis not present

## 2021-01-13 DIAGNOSIS — R051 Acute cough: Secondary | ICD-10-CM | POA: Diagnosis not present

## 2021-01-13 DIAGNOSIS — J984 Other disorders of lung: Secondary | ICD-10-CM | POA: Diagnosis not present

## 2021-01-14 ENCOUNTER — Inpatient Hospital Stay (HOSPITAL_COMMUNITY)
Admission: EM | Admit: 2021-01-14 | Discharge: 2021-02-08 | DRG: 291 | Disposition: E | Payer: Medicare HMO | Source: Skilled Nursing Facility | Attending: Internal Medicine | Admitting: Internal Medicine

## 2021-01-14 ENCOUNTER — Encounter (HOSPITAL_COMMUNITY): Payer: Self-pay

## 2021-01-14 ENCOUNTER — Other Ambulatory Visit: Payer: Self-pay

## 2021-01-14 ENCOUNTER — Emergency Department (HOSPITAL_COMMUNITY): Payer: Medicare HMO

## 2021-01-14 DIAGNOSIS — I82402 Acute embolism and thrombosis of unspecified deep veins of left lower extremity: Secondary | ICD-10-CM | POA: Diagnosis not present

## 2021-01-14 DIAGNOSIS — I82442 Acute embolism and thrombosis of left tibial vein: Secondary | ICD-10-CM | POA: Diagnosis present

## 2021-01-14 DIAGNOSIS — I824Y9 Acute embolism and thrombosis of unspecified deep veins of unspecified proximal lower extremity: Secondary | ICD-10-CM

## 2021-01-14 DIAGNOSIS — I4891 Unspecified atrial fibrillation: Secondary | ICD-10-CM | POA: Diagnosis present

## 2021-01-14 DIAGNOSIS — I82452 Acute embolism and thrombosis of left peroneal vein: Secondary | ICD-10-CM | POA: Diagnosis present

## 2021-01-14 DIAGNOSIS — R000 Tachycardia, unspecified: Secondary | ICD-10-CM | POA: Diagnosis not present

## 2021-01-14 DIAGNOSIS — E872 Acidosis, unspecified: Secondary | ICD-10-CM | POA: Diagnosis present

## 2021-01-14 DIAGNOSIS — I82409 Acute embolism and thrombosis of unspecified deep veins of unspecified lower extremity: Secondary | ICD-10-CM | POA: Diagnosis present

## 2021-01-14 DIAGNOSIS — R062 Wheezing: Secondary | ICD-10-CM | POA: Diagnosis not present

## 2021-01-14 DIAGNOSIS — N184 Chronic kidney disease, stage 4 (severe): Secondary | ICD-10-CM | POA: Diagnosis present

## 2021-01-14 DIAGNOSIS — I5031 Acute diastolic (congestive) heart failure: Secondary | ICD-10-CM | POA: Diagnosis present

## 2021-01-14 DIAGNOSIS — I5021 Acute systolic (congestive) heart failure: Secondary | ICD-10-CM | POA: Diagnosis present

## 2021-01-14 DIAGNOSIS — Z885 Allergy status to narcotic agent status: Secondary | ICD-10-CM | POA: Diagnosis not present

## 2021-01-14 DIAGNOSIS — Z853 Personal history of malignant neoplasm of breast: Secondary | ICD-10-CM | POA: Diagnosis not present

## 2021-01-14 DIAGNOSIS — I517 Cardiomegaly: Secondary | ICD-10-CM | POA: Diagnosis not present

## 2021-01-14 DIAGNOSIS — Z823 Family history of stroke: Secondary | ICD-10-CM | POA: Diagnosis not present

## 2021-01-14 DIAGNOSIS — M810 Age-related osteoporosis without current pathological fracture: Secondary | ICD-10-CM | POA: Diagnosis present

## 2021-01-14 DIAGNOSIS — I110 Hypertensive heart disease with heart failure: Secondary | ICD-10-CM

## 2021-01-14 DIAGNOSIS — I083 Combined rheumatic disorders of mitral, aortic and tricuspid valves: Secondary | ICD-10-CM | POA: Diagnosis present

## 2021-01-14 DIAGNOSIS — I509 Heart failure, unspecified: Secondary | ICD-10-CM

## 2021-01-14 DIAGNOSIS — L538 Other specified erythematous conditions: Secondary | ICD-10-CM | POA: Diagnosis not present

## 2021-01-14 DIAGNOSIS — R0602 Shortness of breath: Secondary | ICD-10-CM | POA: Diagnosis not present

## 2021-01-14 DIAGNOSIS — I429 Cardiomyopathy, unspecified: Secondary | ICD-10-CM | POA: Diagnosis present

## 2021-01-14 DIAGNOSIS — N390 Urinary tract infection, site not specified: Secondary | ICD-10-CM | POA: Diagnosis present

## 2021-01-14 DIAGNOSIS — R739 Hyperglycemia, unspecified: Secondary | ICD-10-CM | POA: Diagnosis present

## 2021-01-14 DIAGNOSIS — J189 Pneumonia, unspecified organism: Secondary | ICD-10-CM | POA: Diagnosis not present

## 2021-01-14 DIAGNOSIS — I248 Other forms of acute ischemic heart disease: Secondary | ICD-10-CM | POA: Diagnosis present

## 2021-01-14 DIAGNOSIS — Z91013 Allergy to seafood: Secondary | ICD-10-CM

## 2021-01-14 DIAGNOSIS — I824Y2 Acute embolism and thrombosis of unspecified deep veins of left proximal lower extremity: Secondary | ICD-10-CM | POA: Diagnosis not present

## 2021-01-14 DIAGNOSIS — R0902 Hypoxemia: Secondary | ICD-10-CM | POA: Diagnosis not present

## 2021-01-14 DIAGNOSIS — I10 Essential (primary) hypertension: Secondary | ICD-10-CM | POA: Diagnosis not present

## 2021-01-14 DIAGNOSIS — N179 Acute kidney failure, unspecified: Secondary | ICD-10-CM | POA: Diagnosis not present

## 2021-01-14 DIAGNOSIS — Z20822 Contact with and (suspected) exposure to covid-19: Secondary | ICD-10-CM | POA: Diagnosis present

## 2021-01-14 DIAGNOSIS — Z66 Do not resuscitate: Secondary | ICD-10-CM | POA: Diagnosis not present

## 2021-01-14 DIAGNOSIS — J90 Pleural effusion, not elsewhere classified: Secondary | ICD-10-CM | POA: Diagnosis not present

## 2021-01-14 DIAGNOSIS — Z79899 Other long term (current) drug therapy: Secondary | ICD-10-CM

## 2021-01-14 DIAGNOSIS — I130 Hypertensive heart and chronic kidney disease with heart failure and stage 1 through stage 4 chronic kidney disease, or unspecified chronic kidney disease: Principal | ICD-10-CM | POA: Diagnosis present

## 2021-01-14 DIAGNOSIS — I824Z2 Acute embolism and thrombosis of unspecified deep veins of left distal lower extremity: Secondary | ICD-10-CM | POA: Diagnosis present

## 2021-01-14 DIAGNOSIS — J9601 Acute respiratory failure with hypoxia: Secondary | ICD-10-CM | POA: Diagnosis present

## 2021-01-14 DIAGNOSIS — I872 Venous insufficiency (chronic) (peripheral): Secondary | ICD-10-CM | POA: Diagnosis present

## 2021-01-14 DIAGNOSIS — D539 Nutritional anemia, unspecified: Secondary | ICD-10-CM | POA: Diagnosis present

## 2021-01-14 DIAGNOSIS — J9811 Atelectasis: Secondary | ICD-10-CM | POA: Diagnosis not present

## 2021-01-14 DIAGNOSIS — Z888 Allergy status to other drugs, medicaments and biological substances status: Secondary | ICD-10-CM

## 2021-01-14 LAB — URINALYSIS, MICROSCOPIC (REFLEX)
Bacteria, UA: NONE SEEN
WBC, UA: >50 WBC/hpf (ref 0–5)

## 2021-01-14 LAB — RESP PANEL BY RT-PCR (FLU A&B, COVID) ARPGX2
Influenza A by PCR: NEGATIVE
Influenza B by PCR: NEGATIVE
SARS Coronavirus 2 by RT PCR: NEGATIVE

## 2021-01-14 LAB — URINALYSIS, ROUTINE W REFLEX MICROSCOPIC
Bilirubin Urine: NEGATIVE
Glucose, UA: NEGATIVE mg/dL
Ketones, ur: NEGATIVE mg/dL
Nitrite: NEGATIVE
Protein, ur: 30 mg/dL — AB
Specific Gravity, Urine: 1.02 (ref 1.005–1.030)
pH: 5.5 (ref 5.0–8.0)

## 2021-01-14 LAB — I-STAT CHEM 8, ED
BUN: 27 mg/dL — ABNORMAL HIGH (ref 8–23)
Calcium, Ion: 1.11 mmol/L — ABNORMAL LOW (ref 1.15–1.40)
Chloride: 108 mmol/L (ref 98–111)
Creatinine, Ser: 1.5 mg/dL — ABNORMAL HIGH (ref 0.44–1.00)
Glucose, Bld: 184 mg/dL — ABNORMAL HIGH (ref 70–99)
HCT: 34 % — ABNORMAL LOW (ref 36.0–46.0)
Hemoglobin: 11.6 g/dL — ABNORMAL LOW (ref 12.0–15.0)
Potassium: 3.7 mmol/L (ref 3.5–5.1)
Sodium: 140 mmol/L (ref 135–145)
TCO2: 20 mmol/L — ABNORMAL LOW (ref 22–32)

## 2021-01-14 LAB — COMPREHENSIVE METABOLIC PANEL
ALT: 14 U/L (ref 0–44)
AST: 22 U/L (ref 15–41)
Albumin: 3.3 g/dL — ABNORMAL LOW (ref 3.5–5.0)
Alkaline Phosphatase: 65 U/L (ref 38–126)
Anion gap: 12 (ref 5–15)
BUN: 26 mg/dL — ABNORMAL HIGH (ref 8–23)
CO2: 20 mmol/L — ABNORMAL LOW (ref 22–32)
Calcium: 8.6 mg/dL — ABNORMAL LOW (ref 8.9–10.3)
Chloride: 106 mmol/L (ref 98–111)
Creatinine, Ser: 1.65 mg/dL — ABNORMAL HIGH (ref 0.44–1.00)
GFR, Estimated: 28 mL/min — ABNORMAL LOW (ref >60)
Glucose, Bld: 145 mg/dL — ABNORMAL HIGH (ref 70–99)
Potassium: 3.5 mmol/L (ref 3.5–5.1)
Sodium: 138 mmol/L (ref 135–145)
Total Bilirubin: 0.6 mg/dL (ref 0.3–1.2)
Total Protein: 7.1 g/dL (ref 6.5–8.1)

## 2021-01-14 LAB — CBC WITH DIFFERENTIAL/PLATELET
Abs Immature Granulocytes: 0.04 10*3/uL (ref 0.00–0.07)
Basophils Absolute: 0 10*3/uL (ref 0.0–0.1)
Basophils Relative: 0 %
Eosinophils Absolute: 0 10*3/uL (ref 0.0–0.5)
Eosinophils Relative: 0 %
HCT: 36 % (ref 36.0–46.0)
Hemoglobin: 11.7 g/dL — ABNORMAL LOW (ref 12.0–15.0)
Immature Granulocytes: 0 %
Lymphocytes Relative: 2 %
Lymphs Abs: 0.2 10*3/uL — ABNORMAL LOW (ref 0.7–4.0)
MCH: 33.3 pg (ref 26.0–34.0)
MCHC: 32.5 g/dL (ref 30.0–36.0)
MCV: 102.6 fL — ABNORMAL HIGH (ref 80.0–100.0)
Monocytes Absolute: 0.1 10*3/uL (ref 0.1–1.0)
Monocytes Relative: 2 %
Neutro Abs: 9.2 10*3/uL — ABNORMAL HIGH (ref 1.7–7.7)
Neutrophils Relative %: 96 %
Platelets: 271 10*3/uL (ref 150–400)
RBC: 3.51 MIL/uL — ABNORMAL LOW (ref 3.87–5.11)
RDW: 14.8 % (ref 11.5–15.5)
WBC: 9.6 10*3/uL (ref 4.0–10.5)
nRBC: 0 % (ref 0.0–0.2)

## 2021-01-14 LAB — TROPONIN I (HIGH SENSITIVITY): Troponin I (High Sensitivity): 62 ng/L — ABNORMAL HIGH (ref <18)

## 2021-01-14 LAB — APTT: aPTT: 30 seconds (ref 24–36)

## 2021-01-14 LAB — PHOSPHORUS: Phosphorus: 3 mg/dL (ref 2.5–4.6)

## 2021-01-14 LAB — MAGNESIUM: Magnesium: 2.2 mg/dL (ref 1.7–2.4)

## 2021-01-14 LAB — LACTIC ACID, PLASMA: Lactic Acid, Venous: 1.9 mmol/L (ref 0.5–1.9)

## 2021-01-14 LAB — MRSA NEXT GEN BY PCR, NASAL: MRSA by PCR Next Gen: NOT DETECTED

## 2021-01-14 LAB — PROTIME-INR
INR: 1.5 — ABNORMAL HIGH (ref 0.8–1.2)
Prothrombin Time: 17.6 seconds — ABNORMAL HIGH (ref 11.4–15.2)

## 2021-01-14 MED ORDER — IOHEXOL 350 MG/ML SOLN
60.0000 mL | Freq: Once | INTRAVENOUS | Status: AC | PRN
  Administered 2021-01-14: 60 mL via INTRAVENOUS

## 2021-01-14 MED ORDER — SODIUM CHLORIDE 0.9 % IV SOLN
500.0000 mg | Freq: Once | INTRAVENOUS | Status: AC
  Administered 2021-01-14: 500 mg via INTRAVENOUS
  Filled 2021-01-14: qty 5

## 2021-01-14 MED ORDER — SODIUM CHLORIDE 0.9 % IV SOLN
2.0000 g | Freq: Once | INTRAVENOUS | Status: AC
  Administered 2021-01-14: 2 g via INTRAVENOUS
  Filled 2021-01-14: qty 2

## 2021-01-14 MED ORDER — ONDANSETRON HCL 4 MG/2ML IJ SOLN: 4.0000 mg | Freq: Four times a day (QID) | INTRAMUSCULAR | Status: DC | PRN

## 2021-01-14 MED ORDER — SODIUM CHLORIDE 0.9 % IV SOLN: 250.0000 mL | INTRAVENOUS | Status: DC | PRN

## 2021-01-14 MED ORDER — SODIUM CHLORIDE 0.9% FLUSH
3.0000 mL | Freq: Two times a day (BID) | INTRAVENOUS | Status: DC
  Administered 2021-01-15: 3 mL via INTRAVENOUS

## 2021-01-14 MED ORDER — IPRATROPIUM-ALBUTEROL 0.5-2.5 (3) MG/3ML IN SOLN
3.0000 mL | Freq: Four times a day (QID) | RESPIRATORY_TRACT | Status: DC
  Administered 2021-01-14 – 2021-01-15 (×4): 3 mL via RESPIRATORY_TRACT
  Filled 2021-01-14 (×4): qty 3

## 2021-01-14 MED ORDER — ACETAMINOPHEN 325 MG PO TABS: 650.0000 mg | ORAL_TABLET | ORAL | Status: DC | PRN

## 2021-01-14 MED ORDER — VANCOMYCIN HCL IN DEXTROSE 1-5 GM/200ML-% IV SOLN
1000.0000 mg | Freq: Once | INTRAVENOUS | Status: AC
  Administered 2021-01-14: 1000 mg via INTRAVENOUS
  Filled 2021-01-14: qty 200

## 2021-01-14 MED ORDER — HEPARIN (PORCINE) 25000 UT/250ML-% IV SOLN
1000.0000 [IU]/h | INTRAVENOUS | Status: DC
  Administered 2021-01-14 – 2021-01-15 (×2): 1000 [IU]/h via INTRAVENOUS
  Filled 2021-01-14 (×2): qty 250

## 2021-01-14 MED ORDER — FUROSEMIDE 10 MG/ML IJ SOLN
40.0000 mg | Freq: Once | INTRAMUSCULAR | Status: AC
  Administered 2021-01-14: 40 mg via INTRAVENOUS
  Filled 2021-01-14: qty 4

## 2021-01-14 MED ORDER — GUAIFENESIN-CODEINE 100-10 MG/5ML PO SOLN
10.0000 mL | Freq: Two times a day (BID) | ORAL | Status: DC
  Administered 2021-01-15 (×2): 10 mL via ORAL
  Filled 2021-01-14 (×2): qty 10

## 2021-01-14 MED ORDER — SODIUM CHLORIDE 0.9 % IV SOLN: 1.0000 g | INTRAVENOUS | Status: DC

## 2021-01-14 MED ORDER — HEPARIN BOLUS VIA INFUSION
3500.0000 [IU] | Freq: Once | INTRAVENOUS | Status: AC
  Administered 2021-01-14: 3500 [IU] via INTRAVENOUS
  Filled 2021-01-14: qty 3500

## 2021-01-14 MED ORDER — VANCOMYCIN VARIABLE DOSE PER UNSTABLE RENAL FUNCTION (PHARMACIST DOSING): Status: DC

## 2021-01-14 MED ORDER — LACTATED RINGERS IV SOLN: INTRAVENOUS | Status: DC

## 2021-01-14 MED ORDER — FUROSEMIDE 10 MG/ML IJ SOLN
40.0000 mg | Freq: Every day | INTRAMUSCULAR | Status: DC
  Administered 2021-01-15: 40 mg via INTRAVENOUS
  Filled 2021-01-14: qty 4

## 2021-01-14 MED ORDER — SODIUM CHLORIDE 0.9% FLUSH: 3.0000 mL | INTRAVENOUS | Status: DC | PRN

## 2021-01-14 MED ORDER — METOPROLOL TARTRATE 12.5 MG HALF TABLET
12.5000 mg | ORAL_TABLET | Freq: Two times a day (BID) | ORAL | Status: DC
  Administered 2021-01-14 – 2021-01-15 (×2): 12.5 mg via ORAL
  Filled 2021-01-14 (×2): qty 1

## 2021-01-14 NOTE — Progress Notes (Signed)
ANTICOAGULATION CONSULT NOTE - Initial Consult  Pharmacy Consult for heparin Indication: DVT  Allergies  Allergen Reactions   Nitrofurantoin Other (See Comments)    unknown   Other Nausea And Vomiting    All seafood Other reaction(s): chest pain or shortness of breath   Tramadol Nausea And Vomiting    Other reaction(s): severe voming and constipation    Patient Measurements: Height: 5\' 3"  (160 cm) Weight: 66.7 kg (147 lb) IBW/kg (Calculated) : 52.4 Heparin Dosing Weight: TBW  Vital Signs: Temp: 97.4 F (36.3 C) (01/07 1259) BP: 120/79 (01/07 1422) Pulse Rate: 132 (01/07 1422)  Labs: No results for input(s): HGB, HCT, PLT, APTT, LABPROT, INR, HEPARINUNFRC, HEPRLOWMOCWT, CREATININE, CKTOTAL, CKMB, TROPONINIHS in the last 72 hours.  CrCl cannot be calculated (Patient's most recent lab result is older than the maximum 21 days allowed.).   Medical History: Past Medical History:  Diagnosis Date   Cancer ([place])    breast - right   Hypertension    Pneumonia      Assessment: 73 YOF presenting from SNF with SOB, recent pna, also new afib.  Doppler with DVT, She is not on anticoagulation PTA   Goal of Therapy:  Heparin level 0.3-0.7 units/ml Monitor platelets by anticoagulation protocol: Yes   Plan:  Heparin 3500 units IV x 1, and gtt at 1000 units/hr F/u 8 hour heparin level F/u long term [facility] plan  Bertis Ruddy, PharmD Clinical Pharmacist ED Pharmacist Phone # [phone] 02/06/2021 2:56 PM

## 2021-01-14 NOTE — ED Triage Notes (Signed)
Pt bib ems from [facility] SNF c/o sob, pneumonia and Afib RVR. Pt has been feeling unwell for the past 6 days. She is currently taking IM and PO antibiotics. Pt was placed on 3L O2 at facility d/t room air 8\2%. Pt has audible wheezes and sounds junky throughout chest cavity. Pt received neb tx at facility at 900. En route pt received albuterol and 125 mg solumedrol with no relief.   HR 120-140 BP 150/110  Pt is not aware if she has afib just that she knows her heart skips beats sometimes.

## 2021-01-14 NOTE — ED Provider Notes (Signed)
°  Physical Exam  BP 125/90    Pulse 65    Temp (!) 97.4 F (36.3 C)    Resp (!) 30    Ht 5\' 3"  (1.6 m)    Wt 66.7 kg    SpO2 98%    BMI 26.04 kg/m   Physical Exam  Procedures  Procedures  ED Course / MDM   Clinical Course as of 01/10/2021 1958  Sat Jan 14, 2021  1453 VAS [place] LOWER EXTREMITY VENOUS (DVT) (ONLY [facility] & [facility]) Patient has acute DVT, we will start her on heparin.  PE suspicion is now higher, therefore CT angiogram has been ordered. [AN]  1454 DG Chest Port 1 View Chest x-ray reviewed independently by me.  There is diffuse pulmonary opacity, questioning pulmonary edema versus multifocal pneumonia.  Radiology interpretation pending.  CT angio chest will certainly help further.  In the interim, BNP and COVID-19 test is pending and patient has already received broad-spectrum antibiotics. [AN]    Clinical Course User Index [AN] Varney Biles, MD   Medical Decision Making Care assumed at 3 PM.  Patient is here with new onset A. fib.  There was concern for possible sepsis as patient is getting treated for possible pneumonia. Patient is also tachycardic and heart rate is in the 130s initially.  Code sepsis initiated and patient was given antibiotics.  Patient also has a leg DVT on ultrasound.  Signout pending CTA and admission  8:26 PM CTA showed no PE.  Patient however may have heart failure on CT.  Hospitalist ordered Lasix.  Patient will need an echo in the hospital.  Patient's heart rate is down to the 60s now.  Amount and/or Complexity of Data Reviewed Independent Historian: EMS External Data Reviewed: labs. Labs: ordered. Radiology: ordered and independent interpretation performed.    Details: No PE ECG/medicine tests: ordered and independent interpretation performed.  Risk Decision regarding hospitalization.        Drenda Freeze, MD 01/29/2021 2027

## 2021-01-14 NOTE — ED Notes (Signed)
Pt blood cultures were drawn alongside with phlebotomy at 15:25 before antibiotics were administered.

## 2021-01-14 NOTE — H&P (Addendum)
History and Physical    [name] NTZ:[id number] DOB: 12/25/1922 DOA: 01/28/2021  PCP: Lajean Manes, MD  Patient coming from: SNF  I have personally briefly reviewed patient's old medical records in [facility]  Chief Complaint: SOB  HPI: [name] is a 88 y.o. female with medical history significant of HTN.  Pt presents to ED from SNF with c/o SOB, tachycardia.  Pt being treated for questionable PNA over past few days due to feelling unwell with cough, SOB.  IM rocephin + PO levaquin havent helped.  Today pt more SOB with O2 sats reported in 80s, started on 3L O2.  EMS called.  No CP, cough productive of green-yellow phlegm.   ED Course: WBC nl, no fever.  A.Fib with rate 110-120s  Has Leg DVT  CTA chest = no PE but has cardiomegally and pulmonary edema.  No focal consolidation.  Given doses of cefepime + vanc in ED.  Started on heparin gtt.  Got 500cc of a 1L bolus.   Review of Systems: As per HPI, otherwise all review of systems negative.  Past Medical History:  Diagnosis Date   Cancer [facility])    breast - right   Hypertension    Pneumonia     Past Surgical History:  Procedure Laterality Date   ABDOMINAL HYSTERECTOMY  1977   HAND SURGERY  1978   INTRAMEDULLARY (IM) NAIL INTERTROCHANTERIC Right 07/10/2018   Procedure: INTRAMEDULLARY (IM) NAIL INTERTROCHANTRIC;  Surgeon: Rod Can, MD;  Location: [facility];  Service: Orthopedics;  Laterality: Right;   INTRAMEDULLARY (IM) NAIL INTERTROCHANTERIC Left 10/31/2018   Procedure: INTRAMEDULLARY (IM) NAIL INTERTROCHANTRIC;  Surgeon: Rod Can, MD;  Location: [facility];  Service: Orthopedics;  Laterality: Left;   TOE NAIL REMOVAL  2011     reports that she has never smoked. She has never used smokeless tobacco. She reports that she does not currently use alcohol. She reports that she does not use drugs.  Allergies  Allergen Reactions   Nitrofurantoin Other (See Comments)    unknown   Other  Nausea And Vomiting    All seafood Other reaction(s): chest pain or shortness of breath   Tramadol Nausea And Vomiting    Other reaction(s): severe voming and constipation    Family History  Problem Relation Age of Onset   Stroke Mother    Cancer Father      Prior to Admission medications   Medication Sig Start Date End Date Taking? Authorizing Provider  furosemide (LASIX) 40 MG tablet Take 40 mg by mouth Daily.  04/24/11  Yes [provider]  guaiFENesin-Codeine 200-10 MG/5ML LIQD Take 10 mLs by mouth in the morning and at bedtime. 01/13/21 01/20/21 Yes [provider]  ipratropium-albuterol (DUONEB) 0.5-2.5 (3) MG/3ML SOLN Take 3 mLs by nebulization in the morning, at noon, and at bedtime. 01/13/21 01/20/21 Yes [provider]  levofloxacin (LEVAQUIN) 750 MG tablet Take 750 mg by mouth daily.   Yes [provider]  potassium chloride (KLOR-CON) 20 MEQ packet Take 20 mEq by mouth daily. 01/16/2021 01/21/21 Yes [provider]  acetaminophen (TYLENOL) 325 MG tablet Take 2 tablets (650 mg total) by mouth every 6 (six) hours as needed for mild pain. Patient not taking: Reported on 01/26/2021 11/07/18   Kathie Dike, MD  amoxicillin-clavulanate (AUGMENTIN) 875-125 MG tablet  02/19/19   [provider]  cephALEXin (KEFLEX) 500 MG capsule  02/13/19   [provider]  Cranberry 500 MG TABS 1 Capsule    [provider]  docusate sodium (COLACE) 100 MG capsule Take 2 capsules (200 mg total) by mouth daily. Patient not taking: Reported on 02/02/2021 11/07/18   Kathie Dike, MD  methocarbamol (ROBAXIN) 500 MG tablet Take 1 tablet (500 mg total) by mouth every 8 (eight) hours as needed for muscle spasms. Patient not taking: Reported on 01/20/2021 11/07/18   Kathie Dike, MD  polyethylene glycol (MIRALAX / GLYCOLAX) 17 g packet Take 17 g by mouth daily as needed for moderate constipation. Patient not taking: Reported on 02/01/2021 11/07/18    Kathie Dike, MD  traMADol (ULTRAM) 50 MG tablet Take 1 tablet (50 mg total) by mouth every 6 (six) hours as needed for severe pain. Patient not taking: Reported on 02/01/2021 11/06/18   Rod Can, MD    Physical Exam: Vitals:   01/25/2021 1800 01/20/2021 1830 01/28/2021 1845 01/16/2021 2000  BP: 119/81 (!) 117/95 125/90 (!) 122/92  Pulse: (!) 117 83 65 (!) 50  Resp: (!) 29 (!) 30 (!) 30 (!) 26  Temp:      SpO2: 98% 98% 98% 100%  Weight:      Height:        Constitutional: Elderly, ill appearing. Eyes: PERRL, lids and conjunctivae normal ENMT: Mucous membranes are moist. Posterior pharynx clear of any exudate or lesions.Normal dentition.  Neck: normal, supple, no masses, no thyromegaly Respiratory: Wheezing with rhonchi, mild increased WOB Cardiovascular: IRR, IRR, tachycardic.  LLE edema present. Abdomen: no tenderness, no masses palpated. No hepatosplenomegaly. Bowel sounds positive.  Musculoskeletal: no clubbing / cyanosis. No joint deformity upper and lower extremities. Good ROM, no contractures. Normal muscle tone.  Skin: no rashes, lesions, ulcers. No induration Neurologic: CN 2-12 grossly intact. Sensation intact, DTR normal. Strength 5/5 in all 4.  Psychiatric: Normal judgment and insight. Alert and oriented x 3. Normal mood.    Labs on Admission: I have personally reviewed following labs and imaging studies  CBC: Recent Labs  Lab 01/19/2021 1333 01/10/2021 1813  WBC 9.6  --   NEUTROABS 9.2*  --   HGB 11.7* 11.6*  HCT 36.0 34.0*  MCV 102.6*  --   PLT 271  --    Basic Metabolic Panel: Recent Labs  Lab 01/17/2021 1333 01/17/2021 1813  NA 138 140  K 3.5 3.7  CL 106 108  CO2 20*  --   GLUCOSE 145* 184*  BUN 26* 27*  CREATININE 1.65* 1.50*  CALCIUM 8.6*  --   MG 2.2  --   PHOS 3.0  --    GFR: Estimated Creatinine Clearance: 20.1 mL/min (A) (by C-G formula based on SCr of 1.5 mg/dL (H)). Liver Function Tests: Recent Labs  Lab 01/13/2021 1333  AST 22  ALT 14   ALKPHOS 65  BILITOT 0.6  PROT 7.1  ALBUMIN 3.3*   No results for input(s): LIPASE, AMYLASE in the last 168 hours. No results for input(s): AMMONIA in the last 168 hours. Coagulation Profile: Recent Labs  Lab 01/11/2021 1333  INR 1.5*   Cardiac Enzymes: No results for input(s): CKTOTAL, CKMB, CKMBINDEX, TROPONINI in the last 168 hours. BNP (last 3 results) No results for input(s): PROBNP in the last 8760 hours. HbA1C: No results for input(s): HGBA1C in the last 72 hours. CBG: No results for input(s): GLUCAP in the last 168 hours. Lipid Profile: No results for input(s): CHOL, HDL, LDLCALC, TRIG, CHOLHDL, LDLDIRECT in the last 72 hours. Thyroid Function Tests: No results for input(s): TSH, T4TOTAL, FREET4, T3FREE, THYROIDAB in the last 72  hours. Anemia Panel: No results for input(s): VITAMINB12, FOLATE, FERRITIN, TIBC, IRON, RETICCTPCT in the last 72 hours. Urine analysis:    Component Value Date/Time   COLORURINE YELLOW 06/15/2011 1628   APPEARANCEUR HAZY (A) 06/15/2011 1628   LABSPEC 1.003 (L) 06/15/2011 1628   PHURINE 7.5 06/15/2011 1628   GLUCOSEU NEGATIVE 06/15/2011 1628   HGBUR TRACE (A) 06/15/2011 1628   BILIRUBINUR NEGATIVE 06/15/2011 1628   KETONESUR NEGATIVE 06/15/2011 1628   PROTEINUR NEGATIVE 06/15/2011 1628   UROBILINOGEN 0.2 06/15/2011 1628   NITRITE NEGATIVE 06/15/2011 1628   LEUKOCYTESUR MODERATE (A) 06/15/2011 1628    Radiological Exams on Admission: CT Angio Chest PE W and/or Wo Contrast  Result Date: 02/07/2021 CLINICAL DATA:  Shortness of breath. Pulmonary embolism (PE) suspected, high prob EXAM: CT ANGIOGRAPHY CHEST WITH CONTRAST TECHNIQUE: Multidetector CT imaging of the chest was performed using the standard protocol during bolus administration of intravenous contrast. Multiplanar CT image reconstructions and MIPs were obtained to evaluate the vascular anatomy. CONTRAST:  2mL OMNIPAQUE IOHEXOL 350 MG/ML SOLN COMPARISON:  None. FINDINGS:  Cardiovascular: No filling defects in the pulmonary arteries to suggest pulmonary emboli. Cardiomegaly. Scattered aortic calcifications. No aneurysm. Mediastinum/Nodes: No mediastinal, hilar, or axillary adenopathy. Trachea and esophagus are unremarkable. Thyroid unremarkable. Lungs/Pleura: Small left pleural effusion and moderate right pleural effusion. Dependent/compressive atelectasis in the lower lobes bilaterally. Scattered ground-glass opacities throughout both lungs could reflect early edema. Upper Abdomen: Imaging into the upper abdomen demonstrates no acute findings. Musculoskeletal: Chest wall soft tissues are unremarkable. No acute bony abnormality. Review of the MIP images confirms the above findings. IMPRESSION: No evidence of pulmonary embolus. Bilateral pleural effusions, right greater than left. Compressive atelectasis in the lower lobes. Cardiomegaly with vascular congestion and possible early edema. Aortic Atherosclerosis (ICD10-I70.0). Electronically Signed   By: Rolm Baptise M.D.   On: 01/19/2021 19:31   DG Chest Port 1 View  Result Date: 01/31/2021 CLINICAL DATA:  Questionable sepsis, shortness of breath, pneumonia EXAM: PORTABLE CHEST 1 VIEW COMPARISON:  10/31/2018 FINDINGS: Cardiomegaly. Mild, diffuse bilateral interstitial pulmonary opacity and small bilateral pleural effusions. The visualized skeletal structures are unremarkable. IMPRESSION: 1. Mild, diffuse bilateral interstitial pulmonary opacity and small bilateral pleural effusions. No focal airspace opacity. 2. Cardiomegaly. Electronically Signed   By: Delanna Ahmadi M.D.   On: 01/09/2021 14:24   VAS [place] LOWER EXTREMITY VENOUS (DVT) (ONLY [facility])  Result Date: 01/17/2021  Lower Venous DVT Study Patient Name:  [name]  Date of Exam:   01/26/2021 Medical Rec #: [id number]         Accession #:    [id number] Date of Birth: 11-12-1922         Patient Gender: F Patient Age:   13 years Exam Location:  [facility] Procedure:       VAS [place] LOWER EXTREMITY VENOUS (DVT) Referring Phys: Thelma Comp NANAVATI --------------------------------------------------------------------------------  Indications: SOB, tachycardia, Erythema, and Pain.  Risk Factors: Pneumonia. Limitations: Patient gasping for air, pain with compression. Comparison Study: No prior study Performing Technologist: Sharion Dove RVS  Examination Guidelines: A complete evaluation includes B-mode imaging, spectral Doppler, color Doppler, and power Doppler as needed of all accessible portions of each vessel. Bilateral testing is considered an integral part of a complete examination. Limited examinations for reoccurring indications may be performed as noted. The reflux portion of the exam is performed with the patient in reverse Trendelenburg.  +-----+---------------+---------+-----------+----------+--------------+  RIGHT Compressibility Phasicity Spontaneity Properties Thrombus Aging  +-----+---------------+---------+-----------+----------+--------------+  CFV  Yes       Yes                                    +-----+---------------+---------+-----------+----------+--------------+   +---------+---------------+---------+-----------+----------+--------------+  LEFT      Compressibility Phasicity Spontaneity Properties Thrombus Aging  +---------+---------------+---------+-----------+----------+--------------+  CFV       Full            No        No                                     +---------+---------------+---------+-----------+----------+--------------+  SFJ       Full                                                             +---------+---------------+---------+-----------+----------+--------------+  FV Prox   Full            Yes       Yes                                    +---------+---------------+---------+-----------+----------+--------------+  FV Mid    Full                                                              +---------+---------------+---------+-----------+----------+--------------+  FV Distal Full                                                             +---------+---------------+---------+-----------+----------+--------------+  PFV       Full                                                             +---------+---------------+---------+-----------+----------+--------------+  POP       Full            Yes       No                                     +---------+---------------+---------+-----------+----------+--------------+  PTV       None                                             Acute           +---------+---------------+---------+-----------+----------+--------------+  PERO  None                                             Acute           +---------+---------------+---------+-----------+----------+--------------+    Summary: RIGHT: - No evidence of common femoral vein obstruction.  LEFT: - Findings consistent with acute deep vein thrombosis involving the left posterior tibial veins, and left peroneal veins.  *See table(s) above for measurements and observations.    Preliminary     EKG: Independently reviewed.  Assessment/Plan Principal Problem:   New onset of congestive heart failure (HCC) Active Problems:   AKI (acute kidney injury) ([place])   Accelerated hypertension   Acute DVT (deep venous thrombosis) (HCC)   Atrial fibrillation with RVR ([place])    New onset CHF - Patient has acute decompensated CHF: Patient presents with: nocturnal cough and dyspnea on exertion / increased shortness of breath Exam findings include: PULMONARY CRACKLES and tachycardia Work up findings include: PULMONARY EDEMA ON CXR and pleural effusion on CXR Due to A.Fib? CHF pathway Lasix 40mg  IV x1 now then 40mg  IV daily Strict intake and output Daily BMP 2d echo Due to mild AKI not starting ACEi (also BP already 664 systolic and want to start rate agent for A.Fib). New onset A.Fib RVR - Rate 110-120  currently Starting metoprolol 12.5mg  PO BID with first dose now Tele monitor Add additional rate agents as needed Acute LE DVT - No PE on CTA Starting heparin gtt HTN - On HPI, but doesn't look like shes on any chronic HTN meds at this point.  BP today in ED is 403 systolic.  DVT prophylaxis: Heparin gtt Code Status: DNR Family Communication: No family in room Disposition Plan: SNF after admit Consults called: None Admission status: Admit to inpatient  Severity of Illness: The appropriate patient status for this patient is INPATIENT. Inpatient status is judged to be reasonable and necessary in order to provide the required intensity of service to ensure the patient's safety. The patient's presenting symptoms, physical exam findings, and initial radiographic and laboratory data in the context of their chronic comorbidities is felt to place them at high risk for further clinical deterioration. Furthermore, it is not anticipated that the patient will be medically stable for discharge from the hospital within 2 midnights of admission.   * I certify that at the point of admission it is my clinical judgment that the patient will require inpatient hospital care spanning beyond 2 midnights from the point of admission due to high intensity of service, high risk for further deterioration and high frequency of surveillance required.*   [name] M. DO [facility]  How to contact the [facility] Attending or Consulting provider [place] or covering provider during after hours [place], for this patient?  Check the care team in [facility] and look for a) attending/consulting [facility] provider listed and b) the [facility] team listed Log into [email or web address]  Amion Physician Scheduling and messaging for groups and whole hospitals  On call and physician scheduling software for group practices, residents, hospitalists and other medical providers for call, clinic, rotation and shift schedules. On[employer] is a  hospital-wide system for scheduling doctors and paging doctors on call. EasyPlot is for scientific plotting and data analysis.  [email or web address]  and use [facility]'s universal password to access. If you do not have the password, please contact  the hospital operator.  Locate the [facility] provider you are looking for under [facility] and page to a number that you can be directly reached. If you still have difficulty reaching the provider, please page the [facility] (Director on Call) for the Hospitalists listed on amion for assistance.  01/17/2021, 9:09 PM

## 2021-01-14 NOTE — ED Provider Notes (Signed)
[facility] EMERGENCY DEPARTMENT Provider Note   CSN: [id number] Arrival date & time: 02/03/2021  1240     History  Chief Complaint  Patient presents with   Atrial Fibrillation    [name] is a 88 y.o. female.  HPI    88 year old female comes to the ED with chief complaint of shortness of breath, tachycardia in the setting of questionable pneumonia that was diagnosed at the nursing facility.  Patient comes from nursing home.  She has been feeling unwell for the last few days.  She was started on IM ceftriaxone and oral antibiotics for a pneumonia yesterday.  I called the nursing home.  They reported that patient was noted to be more short of breath today.  Their O2 sats were in the 80s and she was started on 3 L of oxygen, EMS was called.  Patient was also noted to be tachycardic at that time.  EMS noted that patient was in A. fib.  In route they gave her albuterol and Solu-Medrol, with no significant improvement.  COVID-19 test was sent, but has not resulted from the nursing home.  Patient denies any chest pain, but indicates that she is feeling short of breath and weak.  At baseline, she is active and walks at least once a day.  She has a cough that is producing green-yellow phlegm.  Home Medications Prior to Admission medications   Medication Sig Start Date End Date Taking? Authorizing Provider  furosemide (LASIX) 40 MG tablet Take 40 mg by mouth Daily.  04/24/11  Yes [provider]  guaiFENesin-Codeine 200-10 MG/5ML LIQD Take 10 mLs by mouth in the morning and at bedtime. 01/13/21 01/20/21 Yes [provider]  ipratropium-albuterol (DUONEB) 0.5-2.5 (3) MG/3ML SOLN Take 3 mLs by nebulization in the morning, at noon, and at bedtime. 01/13/21 01/20/21 Yes [provider]  levofloxacin (LEVAQUIN) 750 MG tablet Take 750 mg by mouth daily.   Yes [provider]  potassium chloride (KLOR-CON) 20 MEQ packet Take 20 mEq by mouth  daily. 02/04/2021 01/21/21 Yes [provider]  acetaminophen (TYLENOL) 325 MG tablet Take 2 tablets (650 mg total) by mouth every 6 (six) hours as needed for mild pain. Patient not taking: Reported on 01/13/2021 11/07/18   Kathie Dike, MD  amoxicillin-clavulanate (AUGMENTIN) 875-125 MG tablet  02/19/19   [provider]  cephALEXin (KEFLEX) 500 MG capsule  02/13/19   [provider]  Cranberry 500 MG TABS 1 Capsule    [provider]  docusate sodium (COLACE) 100 MG capsule Take 2 capsules (200 mg total) by mouth daily. Patient not taking: Reported on 01/11/2021 11/07/18   Kathie Dike, MD  methocarbamol (ROBAXIN) 500 MG tablet Take 1 tablet (500 mg total) by mouth every 8 (eight) hours as needed for muscle spasms. Patient not taking: Reported on 02/05/2021 11/07/18   Kathie Dike, MD  polyethylene glycol (MIRALAX / GLYCOLAX) 17 g packet Take 17 g by mouth daily as needed for moderate constipation. Patient not taking: Reported on 02/06/2021 11/07/18   Kathie Dike, MD  traMADol (ULTRAM) 50 MG tablet Take 1 tablet (50 mg total) by mouth every 6 (six) hours as needed for severe pain. Patient not taking: Reported on 02/05/2021 11/06/18   Rod Can, MD      Allergies    Nitrofurantoin, Other, and Tramadol    Review of Systems   Review of Systems  Constitutional:  Positive for activity change.  Respiratory:  Positive for cough and shortness  of breath.   Hematological:  Does not bruise/bleed easily.   Physical Exam Updated Vital Signs BP 120/79    Pulse (!) 132    Temp (!) 97.4 F (36.3 C)    Resp (!) 31    Ht 5\' 3"  (1.6 m)    Wt 66.7 kg    SpO2 97%    BMI 26.04 kg/m  Physical Exam Vitals and nursing note reviewed.  Constitutional:      Appearance: She is well-developed.  HENT:     Head: Atraumatic.  Cardiovascular:     Rate and Rhythm: Tachycardia present. Rhythm irregular.  Pulmonary:     Breath sounds: Wheezing and rhonchi present.      Comments: Tachypneic with mild respiratory distress, generalized rhonchorous breath sounds with wheezing Musculoskeletal:        General: Tenderness present.     Cervical back: Normal range of motion and neck supple.     Left lower leg: Edema present.  Skin:    General: Skin is warm and dry.     Findings: Erythema present.  Neurological:     Mental Status: She is alert and oriented to person, place, and time.    ED Results / Procedures / Treatments   Labs (all labs ordered are listed, but only abnormal results are displayed) Labs Reviewed  RESP PANEL BY RT-PCR (FLU A&B, COVID) [id number]  CULTURE, BLOOD (ROUTINE X 2)  CULTURE, BLOOD (ROUTINE X 2)  LACTIC ACID, PLASMA  LACTIC ACID, PLASMA  COMPREHENSIVE METABOLIC PANEL  CBC WITH DIFFERENTIAL/PLATELET  PROTIME-INR  APTT  URINALYSIS, ROUTINE W REFLEX MICROSCOPIC  MAGNESIUM  PHOSPHORUS  BRAIN NATRIURETIC PEPTIDE  TROPONIN I (HIGH SENSITIVITY)    EKG EKG Interpretation  Date/Time:  Saturday January 14 2021 14:42:48 EST Ventricular Rate:  124 PR Interval:    QRS Duration: 84 QT Interval:  301 QTC Calculation: 433 R Axis:   70 Text Interpretation: Atrial fibrillation Ventricular premature complex Repolarization abnormality, prob rate related new changes noted - afib is new Confirmed by Varney Biles [phone]) on 01/20/2021 2:46:32 PM  Radiology DG Chest Port 1 View  Result Date: 01/26/2021 CLINICAL DATA:  Questionable sepsis, shortness of breath, pneumonia EXAM: PORTABLE CHEST 1 VIEW COMPARISON:  10/31/2018 FINDINGS: Cardiomegaly. Mild, diffuse bilateral interstitial pulmonary opacity and small bilateral pleural effusions. The visualized skeletal structures are unremarkable. IMPRESSION: 1. Mild, diffuse bilateral interstitial pulmonary opacity and small bilateral pleural effusions. No focal airspace opacity. 2. Cardiomegaly. Electronically Signed   By: Delanna Ahmadi M.D.   On: 01/20/2021 14:24    Procedures .Critical  Care Performed by: Varney Biles, MD Authorized by: Varney Biles, MD   Critical care provider statement:    Critical care time (minutes):  50   Critical care was necessary to treat or prevent imminent or life-threatening deterioration of the following conditions:  Respiratory failure and cardiac failure   Critical care was time spent personally by me on the following activities:  Development of treatment plan with patient or surrogate, discussions with consultants, evaluation of patient's response to treatment, examination of patient, ordering and review of laboratory studies, ordering and review of radiographic studies, ordering and performing treatments and interventions, pulse oximetry, re-evaluation of patient's condition, review of old charts, obtaining history from patient or surrogate and interpretation of cardiac output measurements    Medications Ordered in ED Medications  lactated ringers infusion (has no administration in time range)  vancomycin (VANCOCIN) IVPB 1000 mg/200 mL premix (has no administration in time range)  ceFEPIme (  MAXIPIME) 2 g in sodium chloride 0.9 % 100 mL IVPB (has no administration in time range)  azithromycin (ZITHROMAX) 500 mg in sodium chloride 0.9 % 250 mL IVPB (has no administration in time range)    ED Course/ Medical Decision Making/ A&P Clinical Course as of 01/10/2021 1455  Sat Jan 14, 2021  1453 VAS [place] LOWER EXTREMITY VENOUS (DVT) (ONLY [facility] & [facility]) Patient has acute DVT, we will start her on heparin.  PE suspicion is now higher, therefore CT angiogram has been ordered. [AN]  1454 DG Chest Port 1 View Chest x-ray reviewed independently by me.  There is diffuse pulmonary opacity, questioning pulmonary edema versus multifocal pneumonia.  Radiology interpretation pending.  CT angio chest will certainly help further.  In the interim, BNP and COVID-19 test is pending and patient has already received broad-spectrum antibiotics. [AN]    Clinical Course  User Index [AN] Varney Biles, MD                           Medical Decision Making  This patient presents to the ED with chief complaint(s) of shortness of breath, elevated heart rate and weakness with pertinent past medical history of recent diagnosis of pneumonia for which she is on antibiotics.  Patient does not have any known cardiopulmonary or CHF.  The complaint involves an extensive differential diagnosis and treatment options and also carries with it a high risk of complications and morbidity.    The differential diagnosis includes : Pulmonary edema, pleural effusion, multifocal pneumonia, COVID-19 pneumonia, PE, ACS, sepsis, electrolyte abnormality.  The initial plan is to get basic labs and initiated code sepsis.  She is noted to have new onset A. fib, which could be because of underlying sepsis or because of PE/DVT.  Patient is noted to have unilateral left lower extremity swelling -ultrasound DVT is also been ordered.   Additional Test Considered: Troponin Patient is unlikely to have ACS.  EKG is not showing any ischemic findings.  She has no chest pain, and given her increased work of breathing, A. fib and possible sepsis, troponin is likely going to be elevated. therefore do not feel that ordering troponin is indicated.  Comorbidities that complicate the patient evaluation: Patients presentation is complicated by their history of possible pneumonia that was diagnosed at the facility and for which she is already on antibiotics.   Additional history obtained: Additional history obtained from EMS  and nursing home/care facility  Reassessment and review: Lab Tests: I Ordered, and personally interpreted labs.  The pertinent results include: Normal CBC, slightly elevated troponin and slightly elevated creatinine.  Imaging Studies ordered: I independently visualized and interpreted the following imaging X-ray of the chest   which showed diffuse opacity. I agree with the  radiologist interpretation  Critical Interventions:  Diagnosing DVT and starting heparin -  Initiating oxygen treatment for hypoxia - Initiating broad-spectrum antibiotics for pneumonia/sepsis  Cardiac Monitoring: The patient was maintained on a cardiac monitor.  I personally viewed and interpreted the cardiac monitor which showed an underlying rhythm of:  Atrial Fibrillation  Medicines ordered and prescription drug management: I ordered medications, including IV vancomycin, cefepime, azithromycin and IV heparin for treatment of pneumonia and DVT, possible PE Reevaluation of the patient after these medicines showed that the patient    stayed the same   Complexity of problems addressed: Patients presentation is most consistent with  acute presentation with potential threat to life or bodily function During  patient's assessment  Disposition: After consideration of the diagnostic results and the patients response to treatment,  I feel that the patent would benefit from admission to the hospital .   Patient's work-up however is not complete, therefore Dr. Darl Householder will follow up.  Final Clinical Impression(s) / ED Diagnoses Final diagnoses:  Acute respiratory failure with hypoxia (HCC)  Acute deep vein thrombosis (DVT) of proximal vein of left lower extremity (HCC)  Atrial fibrillation with RVR ([place])    Rx / DC Orders ED Discharge Orders     None         Varney Biles, MD 11-Feb-2021 1120

## 2021-01-14 NOTE — Progress Notes (Signed)
Pharmacy Antibiotic Note  [name] is a 88 y.o. female admitted on 01/19/2021 presenting from SNF with SOB, pna.  Pharmacy has been consulted for vancomycin and cefepime dosing.  Plan: Vancomycin 1g IV x 1, then variable dosing due to unstable renal function Cefepime 2g IV x 1, then 1g IV q 24h Monitor renal function, PCR/Cx to narrow Vancomycin levels as needed  Height: 5\' 3"  (160 cm) Weight: 66.7 kg (147 lb) IBW/kg (Calculated) : 52.4  Temp (24hrs), Avg:97.4 F (36.3 C), Min:97.4 F (36.3 C), Max:97.4 F (36.3 C)  Recent Labs  Lab 01/19/2021 1333 02/05/2021 1813  WBC 9.6  --   CREATININE  --  1.50*  LATICACIDVEN 1.9  --     Estimated Creatinine Clearance: 20.1 mL/min (A) (by C-G formula based on SCr of 1.5 mg/dL (H)).    Allergies  Allergen Reactions   Nitrofurantoin Other (See Comments)    unknown   Other Nausea And Vomiting    All seafood Other reaction(s): chest pain or shortness of breath   Tramadol Nausea And Vomiting    Other reaction(s): severe voming and constipation    Bertis Ruddy, PharmD Clinical Pharmacist ED Pharmacist Phone # [phone] 01/26/2021 6:29 PM

## 2021-01-14 NOTE — Progress Notes (Signed)
VASCULAR LAB    Left lower extremity venous duplex has been performed.  See CV proc for preliminary results.  Gave verbal report to Dr. Beverely Risen, [facility], RVT 01/29/2021, 2:25 PM

## 2021-01-14 NOTE — Sepsis Progress Note (Signed)
Sepsis protocol is being by eLink.

## 2021-01-15 ENCOUNTER — Inpatient Hospital Stay (HOSPITAL_COMMUNITY): Payer: Medicare HMO

## 2021-01-15 DIAGNOSIS — I509 Heart failure, unspecified: Secondary | ICD-10-CM

## 2021-01-15 DIAGNOSIS — I5031 Acute diastolic (congestive) heart failure: Secondary | ICD-10-CM

## 2021-01-15 LAB — CBC
HCT: 34 % — ABNORMAL LOW (ref 36.0–46.0)
Hemoglobin: 11.1 g/dL — ABNORMAL LOW (ref 12.0–15.0)
MCH: 33.2 pg (ref 26.0–34.0)
MCHC: 32.6 g/dL (ref 30.0–36.0)
MCV: 101.8 fL — ABNORMAL HIGH (ref 80.0–100.0)
Platelets: 268 10*3/uL (ref 150–400)
RBC: 3.34 MIL/uL — ABNORMAL LOW (ref 3.87–5.11)
RDW: 14.8 % (ref 11.5–15.5)
WBC: 5.2 10*3/uL (ref 4.0–10.5)
nRBC: 0 % (ref 0.0–0.2)

## 2021-01-15 LAB — BASIC METABOLIC PANEL
Anion gap: 14 (ref 5–15)
BUN: 28 mg/dL — ABNORMAL HIGH (ref 8–23)
CO2: 20 mmol/L — ABNORMAL LOW (ref 22–32)
Calcium: 8.5 mg/dL — ABNORMAL LOW (ref 8.9–10.3)
Chloride: 105 mmol/L (ref 98–111)
Creatinine, Ser: 1.67 mg/dL — ABNORMAL HIGH (ref 0.44–1.00)
GFR, Estimated: 28 mL/min — ABNORMAL LOW (ref >60)
Glucose, Bld: 150 mg/dL — ABNORMAL HIGH (ref 70–99)
Potassium: 3.9 mmol/L (ref 3.5–5.1)
Sodium: 139 mmol/L (ref 135–145)

## 2021-01-15 LAB — ECHOCARDIOGRAM COMPLETE
AR max vel: 1.8 cm2
AV Area VTI: 1.55 cm2
AV Area mean vel: 1.71 cm2
AV Mean grad: 3 mmHg
AV Peak grad: 5.4 mmHg
Ao pk vel: 1.16 m/s
Calc EF: 22.7 %
Height: 63 in
MV M vel: 4.17 m/s
MV Peak grad: 69.6 mmHg
P 1/2 time: 606 msec
Radius: 0.5 cm
S' Lateral: 3.5 cm
Single Plane A2C EF: 24.7 %
Single Plane A4C EF: 22.8 %
Weight: 2405.66 oz

## 2021-01-15 LAB — TROPONIN I (HIGH SENSITIVITY): Troponin I (High Sensitivity): 42 ng/L — ABNORMAL HIGH (ref <18)

## 2021-01-15 LAB — MAGNESIUM: Magnesium: 2.1 mg/dL (ref 1.7–2.4)

## 2021-01-15 LAB — HEPARIN LEVEL (UNFRACTIONATED)
Heparin Unfractionated: 0.57 IU/mL (ref 0.30–0.70)
Heparin Unfractionated: 0.64 IU/mL (ref 0.30–0.70)

## 2021-01-15 LAB — BRAIN NATRIURETIC PEPTIDE: B Natriuretic Peptide: 402.1 pg/mL — ABNORMAL HIGH (ref 0.0–100.0)

## 2021-01-15 LAB — PROCALCITONIN: Procalcitonin: 0.18 ng/mL

## 2021-01-15 MED ORDER — APIXABAN 5 MG PO TABS: 10.0000 mg | ORAL_TABLET | Freq: Two times a day (BID) | ORAL | Status: DC

## 2021-01-15 MED ORDER — SODIUM CHLORIDE 0.9 % IV SOLN: 1.0000 g | INTRAVENOUS | Status: DC

## 2021-01-15 MED ORDER — FUROSEMIDE 10 MG/ML IJ SOLN: 20.0000 mg | Freq: Every day | INTRAMUSCULAR | Status: DC

## 2021-01-15 MED ORDER — METOPROLOL TARTRATE 12.5 MG HALF TABLET
12.5000 mg | ORAL_TABLET | Freq: Four times a day (QID) | ORAL | Status: DC
  Administered 2021-01-15: 12.5 mg via ORAL
  Filled 2021-01-15: qty 1

## 2021-01-15 MED ORDER — APIXABAN 5 MG PO TABS: 5.0000 mg | ORAL_TABLET | Freq: Two times a day (BID) | ORAL | Status: DC

## 2021-01-15 MED ORDER — SODIUM CHLORIDE 0.9 % IV SOLN
500.0000 mg | INTRAVENOUS | Status: DC
  Administered 2021-01-15: 500 mg via INTRAVENOUS
  Filled 2021-01-15: qty 5

## 2021-01-15 MED ORDER — METOPROLOL TARTRATE 25 MG PO TABS: 25.0000 mg | ORAL_TABLET | Freq: Two times a day (BID) | ORAL | Status: DC

## 2021-01-15 MED ORDER — ENSURE ENLIVE PO LIQD: 237.0000 mL | Freq: Two times a day (BID) | ORAL | Status: DC

## 2021-01-19 LAB — CULTURE, BLOOD (ROUTINE X 2)
Culture: NO GROWTH
Culture: NO GROWTH

## 2021-02-08 NOTE — Discharge Summary (Addendum)
Death Summary  [name] TFT:[id number] DOB: Dec 17, 1922 DOA: 2021/01/25  PCP: Lajean Manes, MD  Admit date: January 25, 2021 Date of Death: 01-26-2021 Time of Death: 4:50pm  Notification: Lajean Manes, MD notified of death of Jan 26, 2021   History of present illness:  [name] is a 88 y.o. female with a history of [name] is  with medical history significant of HTN, osteoporosis, Vitamin A and D deficiency,CKD, severe chronic venous insufficiency deficiency , breast cancer, presented to ED from SNF with c/o SOB, tachycardia.  Pt was being treated for questionable PNA over past few days due to feelling unwell with cough, SOB.  IM rocephin + PO levaquin did not helped.  On admission day, patient was more SOB with O2 sats reported in 80s, started on 3L O2.  EMS called.No CP, cough productive of green-yellow phlegm.Patient was found in atrial fibrillation in the ER. CTA negative for PE, cardiomegaly and pulmonary edema, and pleural effusion.She had mildly elevated Troponin. Also with macrocytic anemia. Left venous US was obtained and patient found with Lt DVT. Was started on heparin gtt, given diuretics, and IV antibiotics. Patient acutely became sob during her hospitalization and passed away.  Echocardiogram revealed EF of 25% with global hypokinesis and right ventricle systolic function was mildly reduced too.  Also found with valvular disease. Patient's son was notified.    Final Diagnoses:  1.   Acute LE DVT 2. Chornic venous insufficiency 3. CKD stage IV 4. Macrocytic anemia 5. H/xo breast cancer 6. CHF 7.pleural effusion 8. Presumed pneumonia 9.elevated troponin 10. Atrial fibrillation with rapid ventricular response 11.hypertension 12.non anion gap acidosis 13.hyperglycemia 14.hypocalcemia 15.UTI 16.  Moderate to severe mitral valve regurgitation 17.  Severe TR 18.  Acute Severe left ventricular systolic function 19.  Cardiomyopathy 20.  Right ventricular  systolic dysfunction    The results of significant diagnostics from this hospitalization (including imaging, microbiology, ancillary and laboratory) are listed below for reference.    Significant Diagnostic Studies: CT Angio Chest PE W and/or Wo Contrast  Result Date: January 25, 2021 CLINICAL DATA:  Shortness of breath. Pulmonary embolism (PE) suspected, high prob EXAM: CT ANGIOGRAPHY CHEST WITH CONTRAST TECHNIQUE: Multidetector CT imaging of the chest was performed using the standard protocol during bolus administration of intravenous contrast. Multiplanar CT image reconstructions and MIPs were obtained to evaluate the vascular anatomy. CONTRAST:  57mL OMNIPAQUE IOHEXOL 350 MG/ML SOLN COMPARISON:  None. FINDINGS: Cardiovascular: No filling defects in the pulmonary arteries to suggest pulmonary emboli. Cardiomegaly. Scattered aortic calcifications. No aneurysm. Mediastinum/Nodes: No mediastinal, hilar, or axillary adenopathy. Trachea and esophagus are unremarkable. Thyroid unremarkable. Lungs/Pleura: Small left pleural effusion and moderate right pleural effusion. Dependent/compressive atelectasis in the lower lobes bilaterally. Scattered ground-glass opacities throughout both lungs could reflect early edema. Upper Abdomen: Imaging into the upper abdomen demonstrates no acute findings. Musculoskeletal: Chest wall soft tissues are unremarkable. No acute bony abnormality. Review of the MIP images confirms the above findings. IMPRESSION: No evidence of pulmonary embolus. Bilateral pleural effusions, right greater than left. Compressive atelectasis in the lower lobes. Cardiomegaly with vascular congestion and possible early edema. Aortic Atherosclerosis (ICD10-I70.0). Electronically Signed   By: Rolm Baptise M.D.   On: 01/25/2021 19:31   DG Chest Port 1 View  Result Date: 01/25/21 CLINICAL DATA:  Questionable sepsis, shortness of breath, pneumonia EXAM: PORTABLE CHEST 1 VIEW COMPARISON:  10/31/2018 FINDINGS:  Cardiomegaly. Mild, diffuse bilateral interstitial pulmonary opacity and small bilateral pleural effusions. The visualized skeletal structures are unremarkable. IMPRESSION: 1. Mild, diffuse bilateral interstitial  pulmonary opacity and small bilateral pleural effusions. No focal airspace opacity. 2. Cardiomegaly. Electronically Signed   By: Delanna Ahmadi M.D.   On: 01/22/2021 14:24   ECHOCARDIOGRAM COMPLETE  Result Date: 2021/01/24    ECHOCARDIOGRAM REPORT   Patient Name:   [name] Date of Exam: 01/24/21 Medical Rec #:  [id number]        Height:       63.0 in Accession #:    [id number]       Weight:       150.4 lb Date of Birth:  1922-07-24        BSA:          1.713 m Patient Age:    41 years         BP:           110/87 mmHg Patient Gender: F                HR:           113 bpm. Exam Location:  Inpatient Procedure: 2D Echo, Cardiac Doppler and Color Doppler Indications:    CHF-Acute diastolic                 Atrial fibrillation  History:        Patient has no prior history of Echocardiogram examinations.                 DVT.  Referring Phys: [place]  1. Left ventricular ejection fraction, by estimation, is 25%%. The left ventricle has severely decreased function. The left ventricle demonstrates global hypokinesis.  2. Right ventricular systolic function is mildly reduced. The right ventricular size is normal.  3. Right atrial size was mildly dilated.  4. Broad MR jet directed central and posterior into LA . The mitral valve is normal in structure. Moderate to severe mitral valve regurgitation.  5. Tricuspid valve regurgitation is severe.  6. The aortic valve is normal in structure. Aortic valve regurgitation is mild.  7. The inferior vena cava is dilated in size with <50% respiratory variability, suggesting right atrial pressure of 15 mmHg. FINDINGS  Left Ventricle: Left ventricular ejection fraction, by estimation, is 25%%. The left ventricle has severely decreased function.  The left ventricle demonstrates global hypokinesis. The left ventricular internal cavity size was normal in size. There is no  left ventricular hypertrophy. Right Ventricle: The right ventricular size is normal. Right vetricular wall thickness was not assessed. Right ventricular systolic function is mildly reduced. Left Atrium: Left atrial size was normal in size. Right Atrium: Right atrial size was mildly dilated. Pericardium: There is no evidence of pericardial effusion. Mitral Valve: Broad MR jet directed central and posterior into LA. The mitral valve is normal in structure. Moderate to severe mitral valve regurgitation. Tricuspid Valve: The tricuspid valve is normal in structure. Tricuspid valve regurgitation is severe. Aortic Valve: The aortic valve is normal in structure. Aortic valve regurgitation is mild. Aortic regurgitation PHT measures 606 msec. Aortic valve mean gradient measures 3.0 mmHg. Aortic valve peak gradient measures 5.4 mmHg. Aortic valve area, by VTI measures 1.55 cm. Pulmonic Valve: The pulmonic valve was not well visualized. Pulmonic valve regurgitation is not visualized. Aorta: The aortic root and ascending aorta are structurally normal, with no evidence of dilitation. Venous: The inferior vena cava is dilated in size with less than 50% respiratory variability, suggesting right atrial pressure of 15 mmHg. IAS/Shunts: No atrial level shunt detected by color flow Doppler.  LEFT VENTRICLE PLAX 2D LVIDd:         4.50 cm LVIDs:         3.50 cm LV PW:         0.80 cm LV IVS:        0.90 cm LVOT diam:     1.80 cm LV SV:         27 LV SV Index:   16 LVOT Area:     2.54 cm  LV Volumes (MOD) LV vol d, MOD A2C: 107.0 ml LV vol d, MOD A4C: 83.5 ml LV vol s, MOD A2C: 80.6 ml LV vol s, MOD A4C: 64.5 ml LV SV MOD A2C:     26.4 ml LV SV MOD A4C:     83.5 ml LV SV MOD BP:      22.7 ml RIGHT VENTRICLE            IVC RV Basal diam:  2.90 cm    IVC diam: 2.30 cm RV S prime:     6.09 cm/s TAPSE (M-mode): 1.2  cm LEFT ATRIUM           Index        RIGHT ATRIUM           Index LA diam:      3.70 cm 2.16 cm/m   RA Area:     17.10 cm LA Vol (A2C): 33.6 ml 19.62 ml/m  RA Volume:   47.40 ml  27.67 ml/m LA Vol (A4C): 36.2 ml 21.13 ml/m  AORTIC VALVE AV Area (Vmax):    1.80 cm AV Area (Vmean):   1.71 cm AV Area (VTI):     1.55 cm AV Vmax:           116.00 cm/s AV Vmean:          77.000 cm/s AV VTI:            0.172 m AV Peak Grad:      5.4 mmHg AV Mean Grad:      3.0 mmHg LVOT Vmax:         82.10 cm/s LVOT Vmean:        51.700 cm/s LVOT VTI:          0.105 m LVOT/AV VTI ratio: 0.61 AI PHT:            606 msec  AORTA Ao Asc diam: 2.70 cm MR Peak grad:    69.6 mmHg    TRICUSPID VALVE MR Mean grad:    43.0 mmHg    TR Peak grad:   34.3 mmHg MR Vmax:         417.00 cm/s  TR Vmax:        293.00 cm/s MR Vmean:        306.0 cm/s MR PISA:         1.57 cm     SHUNTS MR PISA Eff ROA: 15 mm       Systemic VTI:  0.10 m MR PISA Radius:  0.50 cm      Systemic Diam: 1.80 cm Dorris Carnes MD Electronically signed by Dorris Carnes MD Signature Date/Time: 02-10-21/7:00:59 PM    Final    VAS [place] LOWER EXTREMITY VENOUS (DVT) (ONLY [facility] & [facility])  Result Date: 10-Feb-2021  Lower Venous DVT Study Patient Name:  BREN BORYS  Date of Exam:   01/27/2021 Medical Rec #: [id number]         Accession #:    [id number] Date  of Birth: 1922-06-19         Patient Gender: F Patient Age:   50 years Exam Location:  [facility] Procedure:      VAS [place] LOWER EXTREMITY VENOUS (DVT) Referring Phys: ANKIT NANAVATI --------------------------------------------------------------------------------  Indications: SOB, tachycardia, Erythema, and Pain.  Risk Factors: Pneumonia. Limitations: Patient gasping for air, pain with compression. Comparison Study: No prior study Performing Technologist: Sharion Dove RVS  Examination Guidelines: A complete evaluation includes B-mode imaging, spectral Doppler, color Doppler, and power Doppler as needed of all accessible portions  of each vessel. Bilateral testing is considered an integral part of a complete examination. Limited examinations for reoccurring indications may be performed as noted. The reflux portion of the exam is performed with the patient in reverse Trendelenburg.  +-----+---------------+---------+-----------+----------+--------------+  RIGHT Compressibility Phasicity Spontaneity Properties Thrombus Aging  +-----+---------------+---------+-----------+----------+--------------+  CFV                   Yes       Yes                                    +-----+---------------+---------+-----------+----------+--------------+   +---------+---------------+---------+-----------+----------+--------------+  LEFT      Compressibility Phasicity Spontaneity Properties Thrombus Aging  +---------+---------------+---------+-----------+----------+--------------+  CFV       Full            No        No                                     +---------+---------------+---------+-----------+----------+--------------+  SFJ       Full                                                             +---------+---------------+---------+-----------+----------+--------------+  FV Prox   Full            Yes       Yes                                    +---------+---------------+---------+-----------+----------+--------------+  FV Mid    Full                                                             +---------+---------------+---------+-----------+----------+--------------+  FV Distal Full                                                             +---------+---------------+---------+-----------+----------+--------------+  PFV       Full                                                             +---------+---------------+---------+-----------+----------+--------------+  POP       Full            Yes       No                                     +---------+---------------+---------+-----------+----------+--------------+  PTV       None                                              Acute           +---------+---------------+---------+-----------+----------+--------------+  PERO      None                                             Acute           +---------+---------------+---------+-----------+----------+--------------+    Summary: RIGHT: - No evidence of common femoral vein obstruction.  LEFT: - Findings consistent with acute deep vein thrombosis involving the left posterior tibial veins, and left peroneal veins.  *See table(s) above for measurements and observations. Electronically signed by Orlie Pollen on February 14, 2021 at 2:31:32 PM.    Final     Microbiology: Recent Results (from the past 240 hour(s))  Resp Panel by RT-PCR (Flu A&B, Covid) Nasopharyngeal Swab     Status: None   Collection Time: 01/21/2021  3:30 PM   Specimen: Nasopharyngeal Swab; Nasopharyngeal(NP) swabs in vial transport medium  Result Value Ref Range Status   SARS Coronavirus 2 by RT PCR NEGATIVE NEGATIVE Final    Comment: (NOTE) SARS-CoV-2 target nucleic acids are NOT DETECTED.  The SARS-CoV-2 RNA is generally detectable in upper respiratory specimens during the acute phase of infection. The lowest concentration of SARS-CoV-2 viral copies this assay can detect is 138 copies/mL. A negative result does not preclude SARS-Cov-2 infection and should not be used as the sole basis for treatment or other patient management decisions. A negative result may occur with  improper specimen collection/handling, submission of specimen other than nasopharyngeal swab, presence of viral mutation(s) within the areas targeted by this assay, and inadequate number of viral copies(<138 copies/mL). A negative result must be combined with clinical observations, patient history, and epidemiological information. The expected result is Negative.  Fact Sheet for Patients:  EntrepreneurPulse.com.au  Fact Sheet for Healthcare Providers:  IncredibleEmployment.be  This  test is no t yet approved or cleared by the [place] FDA and  has been authorized for detection and/or diagnosis of SARS-CoV-2 by FDA under an Emergency Use Authorization (EUA). This EUA will remain  in effect (meaning this test can be used) for the duration of the COVID-19 declaration under Section 564(b)(1) of the Act, 21 U.S.C.section 360bbb-3(b)(1), unless the authorization is terminated  or revoked sooner.       Influenza A by PCR NEGATIVE NEGATIVE Final   Influenza B by PCR NEGATIVE NEGATIVE Final    Comment: (NOTE) The Xpert Xpress SARS-CoV-2/FLU/RSV plus assay is intended as an aid in the diagnosis of influenza from Nasopharyngeal swab specimens and should not be used as a sole basis for treatment. Nasal washings and aspirates are unacceptable for Xpert Xpress SARS-CoV-2/FLU/RSV testing.  Fact Sheet for Patients: EntrepreneurPulse.com.au  Fact Sheet  for Healthcare Providers: IncredibleEmployment.be  This test is not yet approved or cleared by the [place] and has been authorized for detection and/or diagnosis of SARS-CoV-2 by FDA under an Emergency Use Authorization (EUA). This EUA will remain in effect (meaning this test can be used) for the duration of the COVID-19 declaration under Section 564(b)(1) of the Act, 21 U.S.C. section 360bbb-3(b)(1), unless the authorization is terminated or revoked.  Performed at [facility], [place] [address] [place], [place] [address]   Blood Culture (routine x 2)     Status: None (Preliminary result)   Collection Time: 01/08/2021  3:30 PM   Specimen: BLOOD LEFT FOREARM  Result Value Ref Range Status   Specimen Description BLOOD LEFT FOREARM  Final   Special Requests   Final    BOTTLES DRAWN AEROBIC AND ANAEROBIC Blood Culture results may not be optimal due to an inadequate volume of blood received in culture bottles   Culture   Final    NO GROWTH 2 DAYS Performed at [facility], [place] [address] [place], [place] [address]    Report Status PENDING  Incomplete  Blood Culture (routine x 2)     Status: None (Preliminary result)   Collection Time: 01/09/2021  3:30 PM   Specimen: BLOOD LEFT WRIST  Result Value Ref Range Status   Specimen Description BLOOD LEFT WRIST  Final   Special Requests   Final    BOTTLES DRAWN AEROBIC AND ANAEROBIC Blood Culture results may not be optimal due to an inadequate volume of blood received in culture bottles   Culture   Final    NO GROWTH 2 DAYS Performed at [facility], [place] [address] [place], [place] [address]    Report Status PENDING  Incomplete  MRSA Next Gen by PCR, Nasal     Status: None   Collection Time: 01/26/2021  7:57 PM   Specimen: Nasal Mucosa; Nasal Swab  Result Value Ref Range Status   MRSA by PCR Next Gen NOT DETECTED NOT DETECTED Final    Comment: (NOTE) The GeneXpert MRSA Assay (FDA approved for NASAL specimens only), is one component of a comprehensive MRSA colonization surveillance program. It is not intended to diagnose MRSA infection nor to guide or monitor treatment for MRSA infections. Test performance is not FDA approved in patients less than 85 years old. Performed at [facility], [place] [address], [place], [place] [address]      Labs: Basic Metabolic Panel: Recent Labs  Lab 01/19/2021 1333 01/12/2021 1813 01/23/2021 2355 2021/01/27 0241  NA 138 140 139  --   K 3.5 3.7 3.9  --   CL 106 108 105  --   CO2 20*  --  20*  --   GLUCOSE 145* 184* 150*  --   BUN 26* 27* 28*  --   CREATININE 1.65* 1.50* 1.67*  --   CALCIUM 8.6*  --  8.5*  --   MG 2.2  --   --  2.1  PHOS 3.0  --   --   --    Liver Function Tests: Recent Labs  Lab 02/07/2021 1333  AST 22  ALT 14  ALKPHOS 65  BILITOT 0.6  PROT 7.1  ALBUMIN 3.3*   No results for input(s): LIPASE, AMYLASE in the last 168 hours. No results for input(s): AMMONIA in the last 168 hours. CBC: Recent Labs  Lab 01/08/2021 1333  02/02/2021 1813 2021-01-27 0241  WBC 9.6  --  5.2  NEUTROABS 9.2*  --   --   HGB 11.7* 11.6* 11.1*  HCT 36.0 34.0* 34.0*  MCV 102.6*  --  101.8*  PLT 271  --  268   Cardiac Enzymes: No results for input(s): CKTOTAL, CKMB, CKMBINDEX, TROPONINI in the last 168 hours. D-Dimer No results for input(s): DDIMER in the last 72 hours. BNP: Invalid input(s): POCBNP CBG: No results for input(s): GLUCAP in the last 168 hours. Anemia work up No results for input(s): VITAMINB12, FOLATE, FERRITIN, TIBC, IRON, RETICCTPCT in the last 72 hours. Urinalysis    Component Value Date/Time   COLORURINE YELLOW 01/26/2021 2329   APPEARANCEUR TURBID (A) 02/05/2021 2329   LABSPEC 1.020 01/24/2021 2329   PHURINE 5.5 01/16/2021 2329   GLUCOSEU NEGATIVE 01/10/2021 2329   HGBUR MODERATE (A) 01/24/2021 2329   BILIRUBINUR NEGATIVE 01/25/2021 2329   KETONESUR NEGATIVE 01/28/2021 2329   PROTEINUR 30 (A) 01/21/2021 2329   UROBILINOGEN 0.2 06/15/2011 1628   NITRITE NEGATIVE 01/13/2021 2329   LEUKOCYTESUR MODERATE (A) 01/28/2021 2329   Sepsis Labs Invalid input(s): PROCALCITONIN,  WBC,  LACTICIDVEN     SIGNED:  Nolberto Hanlon, MD  [facility] 01/16/2021, 4:47 PM Pager   If 7PM-7AM, please contact night-coverage [email or web address] Password [email or web address]

## 2021-02-08 NOTE — H&P (Addendum)
PROGRESS NOTE    [name]  BMW:[id number] DOB: [birth year] DOA: 01/25/2021 PCP: Lajean Manes, MD    Brief Narrative:  [name] is a 88 y.o. female with medical history significant of HTN.   Pt presents to ED from SNF with c/o SOB, tachycardia.  Pt being treated for questionable PNA over past few days due to feelling unwell with cough, SOB.  IM rocephin + PO levaquin havent helped. pt more SOB with O2 sats reported in 80s, started on 3L O2.  EMS called.  No CP, cough productive of green-yellow phlegm.     ED Course: WBC nl, no fever.  A.Fib with rate 110-120s  Has Leg DVT  CTA chest = no PE but has cardiomegally and pulmonary edema.  No focal consolidation.   Given doses of cefepime + vanc in ED.  Started on heparin gtt.         Consultants:    Procedures:   Antimicrobials:    Cef, vanco  Subjective: Feels less sob than yesterday, but not at baseline.   Objective: Vitals:   02/12/21 0226 12-Feb-2021 0426 12-Feb-2021 0442 Feb 12, 2021 0813  BP: 116/76   110/87  Pulse: (!) 111   (!) 113  Resp: (!) 24 (!) 22    Temp: 98.1 F (36.7 C) 97.9 F (36.6 C)    TempSrc: Oral Oral    SpO2: 99%     Weight:   68.2 kg   Height:        Intake/Output Summary (Last 24 hours) at 02-12-21 0840 Last data filed at 02-12-21 0600 Gross per 24 hour  Intake 1331.6 ml  Output 400 ml  Net 931.6 ml   Filed Weights   01/17/2021 1319 02/12/2021 0045 2021/02/12 0442  Weight: 66.7 kg 68.2 kg 68.2 kg    Examination:  General exam: Appears calm and comfortable  Respiratory system: +crackles, no wheezing Cardiovascular system: S1 & S2 heard, irrg, no gallop positive JVD Gastrointestinal system: Abdomen is nondistended, soft and nontender. No organomegaly or masses felt. Normal bowel sounds heard. Central nervous system: Alert and oriented x3 grossly intact Extremities: No edema Psychiatry:  Mood & affect appropriate.     Data Reviewed: I have personally reviewed following  labs and imaging studies  CBC: Recent Labs  Lab 01/13/2021 1333 01/13/2021 1813 Feb 12, 2021 0241  WBC 9.6  --  5.2  NEUTROABS 9.2*  --   --   HGB 11.7* 11.6* 11.1*  HCT 36.0 34.0* 34.0*  MCV 102.6*  --  101.8*  PLT 271  --  272   Basic Metabolic Panel: Recent Labs  Lab 02/06/2021 1333 02/04/2021 1813 02/02/2021 2355 02/12/2021 0241  NA 138 140 139  --   K 3.5 3.7 3.9  --   CL 106 108 105  --   CO2 20*  --  20*  --   GLUCOSE 145* 184* 150*  --   BUN 26* 27* 28*  --   CREATININE 1.65* 1.50* 1.67*  --   CALCIUM 8.6*  --  8.5*  --   MG 2.2  --   --  2.1  PHOS 3.0  --   --   --    GFR: Estimated Creatinine Clearance: 18.3 mL/min (A) (by C-G formula based on SCr of 1.67 mg/dL (H)). Liver Function Tests: Recent Labs  Lab 01/31/2021 1333  AST 22  ALT 14  ALKPHOS 65  BILITOT 0.6  PROT 7.1  ALBUMIN 3.3*   No results for input(s): LIPASE, AMYLASE in the  last 168 hours. No results for input(s): AMMONIA in the last 168 hours. Coagulation Profile: Recent Labs  Lab 02/05/2021 1333  INR 1.5*   Cardiac Enzymes: No results for input(s): CKTOTAL, CKMB, CKMBINDEX, TROPONINI in the last 168 hours. BNP (last 3 results) No results for input(s): PROBNP in the last 8760 hours. HbA1C: No results for input(s): HGBA1C in the last 72 hours. CBG: No results for input(s): GLUCAP in the last 168 hours. Lipid Profile: No results for input(s): CHOL, HDL, LDLCALC, TRIG, CHOLHDL, LDLDIRECT in the last 72 hours. Thyroid Function Tests: No results for input(s): TSH, T4TOTAL, FREET4, T3FREE, THYROIDAB in the last 72 hours. Anemia Panel: No results for input(s): VITAMINB12, FOLATE, FERRITIN, TIBC, IRON, RETICCTPCT in the last 72 hours. Sepsis Labs: Recent Labs  Lab 01/10/2021 1333  LATICACIDVEN 1.9    Recent Results (from the past 240 hour(s))  Resp Panel by RT-PCR (Flu A&B, Covid) Nasopharyngeal Swab     Status: None   Collection Time: 01/30/2021  3:30 PM   Specimen: Nasopharyngeal Swab;  Nasopharyngeal(NP) swabs in vial transport medium  Result Value Ref Range Status   SARS Coronavirus 2 by RT PCR NEGATIVE NEGATIVE Final    Comment: (NOTE) SARS-CoV-2 target nucleic acids are NOT DETECTED.  The SARS-CoV-2 RNA is generally detectable in upper respiratory specimens during the acute phase of infection. The lowest concentration of SARS-CoV-2 viral copies this assay can detect is 138 copies/mL. A negative result does not preclude SARS-Cov-2 infection and should not be used as the sole basis for treatment or other patient management decisions. A negative result may occur with  improper specimen collection/handling, submission of specimen other than nasopharyngeal swab, presence of viral mutation(s) within the areas targeted by this assay, and inadequate number of viral copies(<138 copies/mL). A negative result must be combined with clinical observations, patient history, and epidemiological information. The expected result is Negative.  Fact Sheet for Patients:  EntrepreneurPulse.com.au  Fact Sheet for Healthcare Providers:  IncredibleEmployment.be  This test is no t yet approved or cleared by the [place] FDA and  has been authorized for detection and/or diagnosis of SARS-CoV-2 by FDA under an Emergency Use Authorization (EUA). This EUA will remain  in effect (meaning this test can be used) for the duration of the COVID-19 declaration under Section 564(b)(1) of the Act, 21 U.S.C.section 360bbb-3(b)(1), unless the authorization is terminated  or revoked sooner.       Influenza A by PCR NEGATIVE NEGATIVE Final   Influenza B by PCR NEGATIVE NEGATIVE Final    Comment: (NOTE) The Xpert Xpress SARS-CoV-2/FLU/RSV plus assay is intended as an aid in the diagnosis of influenza from Nasopharyngeal swab specimens and should not be used as a sole basis for treatment. Nasal washings and aspirates are unacceptable for Xpert Xpress  SARS-CoV-2/FLU/RSV testing.  Fact Sheet for Patients: EntrepreneurPulse.com.au  Fact Sheet for Healthcare Providers: IncredibleEmployment.be  This test is not yet approved or cleared by the [place] FDA and has been authorized for detection and/or diagnosis of SARS-CoV-2 by FDA under an Emergency Use Authorization (EUA). This EUA will remain in effect (meaning this test can be used) for the duration of the COVID-19 declaration under Section 564(b)(1) of the Act, 21 U.S.C. section 360bbb-3(b)(1), unless the authorization is terminated or revoked.  Performed at [facility], [place] [address], [place], [place] [address]   MRSA Next Gen by PCR, Nasal     Status: None   Collection Time: 01/13/2021  7:57 PM   Specimen:  Nasal Mucosa; Nasal Swab  Result Value Ref Range Status   MRSA by PCR Next Gen NOT DETECTED NOT DETECTED Final    Comment: (NOTE) The GeneXpert MRSA Assay (FDA approved for NASAL specimens only), is one component of a comprehensive MRSA colonization surveillance program. It is not intended to diagnose MRSA infection nor to guide or monitor treatment for MRSA infections. Test performance is not FDA approved in patients less than 66 years old. Performed at [facility], [place] [address] [place], [place] [address]          Radiology Studies: CT Angio Chest PE W and/or Wo Contrast  Result Date: 02/02/2021 CLINICAL DATA:  Shortness of breath. Pulmonary embolism (PE) suspected, high prob EXAM: CT ANGIOGRAPHY CHEST WITH CONTRAST TECHNIQUE: Multidetector CT imaging of the chest was performed using the standard protocol during bolus administration of intravenous contrast. Multiplanar CT image reconstructions and MIPs were obtained to evaluate the vascular anatomy. CONTRAST:  76mL OMNIPAQUE IOHEXOL 350 MG/ML SOLN COMPARISON:  None. FINDINGS: Cardiovascular: No filling defects in the pulmonary arteries to suggest pulmonary emboli.  Cardiomegaly. Scattered aortic calcifications. No aneurysm. Mediastinum/Nodes: No mediastinal, hilar, or axillary adenopathy. Trachea and esophagus are unremarkable. Thyroid unremarkable. Lungs/Pleura: Small left pleural effusion and moderate right pleural effusion. Dependent/compressive atelectasis in the lower lobes bilaterally. Scattered ground-glass opacities throughout both lungs could reflect early edema. Upper Abdomen: Imaging into the upper abdomen demonstrates no acute findings. Musculoskeletal: Chest wall soft tissues are unremarkable. No acute bony abnormality. Review of the MIP images confirms the above findings. IMPRESSION: No evidence of pulmonary embolus. Bilateral pleural effusions, right greater than left. Compressive atelectasis in the lower lobes. Cardiomegaly with vascular congestion and possible early edema. Aortic Atherosclerosis (ICD10-I70.0). Electronically Signed   By: Rolm Baptise M.D.   On: 01/30/2021 19:31   DG Chest Port 1 View  Result Date: 01/08/2021 CLINICAL DATA:  Questionable sepsis, shortness of breath, pneumonia EXAM: PORTABLE CHEST 1 VIEW COMPARISON:  10/31/2018 FINDINGS: Cardiomegaly. Mild, diffuse bilateral interstitial pulmonary opacity and small bilateral pleural effusions. The visualized skeletal structures are unremarkable. IMPRESSION: 1. Mild, diffuse bilateral interstitial pulmonary opacity and small bilateral pleural effusions. No focal airspace opacity. 2. Cardiomegaly. Electronically Signed   By: Delanna Ahmadi M.D.   On: 01/20/2021 14:24   VAS [place] LOWER EXTREMITY VENOUS (DVT) (ONLY [facility] & [facility])  Result Date: 01/08/2021  Lower Venous DVT Study Patient Name:  DALEYZA GADOMSKI  Date of Exam:   02/03/2021 Medical Rec #: [id number]         Accession #:    [id number] Date of Birth: [birth year]         Patient Gender: F Patient Age:   5 years Exam Location:  [facility] Procedure:      VAS [place] LOWER EXTREMITY VENOUS (DVT) Referring Phys: Thelma Comp NANAVATI  --------------------------------------------------------------------------------  Indications: SOB, tachycardia, Erythema, and Pain.  Risk Factors: Pneumonia. Limitations: Patient gasping for air, pain with compression. Comparison Study: No prior study Performing Technologist: Sharion Dove RVS  Examination Guidelines: A complete evaluation includes B-mode imaging, spectral Doppler, color Doppler, and power Doppler as needed of all accessible portions of each vessel. Bilateral testing is considered an integral part of a complete examination. Limited examinations for reoccurring indications may be performed as noted. The reflux portion of the exam is performed with the patient in reverse Trendelenburg.  +-----+---------------+---------+-----------+----------+--------------+  RIGHT Compressibility Phasicity Spontaneity Properties Thrombus Aging  +-----+---------------+---------+-----------+----------+--------------+  CFV  Yes       Yes                                    +-----+---------------+---------+-----------+----------+--------------+   +---------+---------------+---------+-----------+----------+--------------+  LEFT      Compressibility Phasicity Spontaneity Properties Thrombus Aging  +---------+---------------+---------+-----------+----------+--------------+  CFV       Full            No        No                                     +---------+---------------+---------+-----------+----------+--------------+  SFJ       Full                                                             +---------+---------------+---------+-----------+----------+--------------+  FV Prox   Full            Yes       Yes                                    +---------+---------------+---------+-----------+----------+--------------+  FV Mid    Full                                                             +---------+---------------+---------+-----------+----------+--------------+  FV Distal Full                                                              +---------+---------------+---------+-----------+----------+--------------+  PFV       Full                                                             +---------+---------------+---------+-----------+----------+--------------+  POP       Full            Yes       No                                     +---------+---------------+---------+-----------+----------+--------------+  PTV       None                                             Acute           +---------+---------------+---------+-----------+----------+--------------+  PERO  None                                             Acute           +---------+---------------+---------+-----------+----------+--------------+    Summary: RIGHT: - No evidence of common femoral vein obstruction.  LEFT: - Findings consistent with acute deep vein thrombosis involving the left posterior tibial veins, and left peroneal veins.  *See table(s) above for measurements and observations.    Preliminary         Scheduled Meds:  feeding supplement  237 mL Oral BID BM   furosemide  40 mg Intravenous Daily   guaiFENesin-codeine  10 mL Oral BID   ipratropium-albuterol  3 mL Nebulization Q6H   metoprolol tartrate  12.5 mg Oral BID   sodium chloride flush  3 mL Intravenous Q12H   Continuous Infusions:  sodium chloride     heparin 1,000 Units/hr (Jan 21, 2021 0600)    Assessment & Plan:   Principal Problem:   New onset of congestive heart failure (HCC) Active Problems:   AKI (acute kidney injury) ([place])   Accelerated hypertension   Acute DVT (deep venous thrombosis) (HCC)   Atrial fibrillation with RVR (HCC)   Sob/acute CHF Echo pending In afib rvr. Is this contributing or vise versa Ck echo Need to better Rate control Check BMP Decrease Lasix to 20 mg IV daily due to CKD I's and O's Daily weight Is sob also from cap? Less likely but will empirically treat for now we will change antibiotics to ceftriaxone and  azithromycin   A. fib RVR Rate has room for improvement Change metoprolol to 12.5 mg p.o. every 6 hours with parameters increase as tolerated   Acute lower extremity DVT CTA no PE On heparin drip, will transition to Eliquis Will need to follow-up with primary care as outpatient  Hypertension Controlled  Will obtain PT OT   DVT prophylaxis: Heparin drip Code Status: DNR Family Communication: None at bedside Disposition Plan:  Status is: Inpatient  Remains inpatient appropriate because: IV treatment needs heart rate control            LOS: 1 day   Time spent: 45 minutes with more than 50% on [place], MD [facility] Pager [phone]  If 7PM-7AM, please contact night-coverage 2021-01-21, 8:40 AM

## 2021-02-08 NOTE — Plan of Care (Signed)
°  Problem: Education: Goal: Knowledge of General Education information will improve Description: Including pain rating scale, medication(s)/side effects and non-pharmacologic comfort measures Outcome: Progressing   Problem: Clinical Measurements: Goal: Ability to maintain clinical measurements within normal limits will improve Outcome: Progressing   Problem: Clinical Measurements: Goal: Respiratory complications will improve Outcome: Progressing   Problem: Clinical Measurements: Goal: Cardiovascular complication will be avoided Outcome: Progressing   Problem: Safety: Goal: Ability to remain free from injury will improve Outcome: Progressing   Problem: Skin Integrity: Goal: Risk for impaired skin integrity will decrease Outcome: Progressing   Problem: Cardiac: Goal: Ability to achieve and maintain adequate cardiopulmonary perfusion will improve Outcome: Progressing

## 2021-02-08 NOTE — Progress Notes (Signed)
°   02-10-2021 0026  Assess: MEWS Score  Temp 97.6 F (36.4 C)  BP (!) 131/94  Pulse Rate (!) 108  ECG Heart Rate (!) 106  Resp (!) 23  SpO2 100 %  O2 Device Nasal Cannula  O2 Flow Rate (L/min) 4 L/min  Assess: MEWS Score  MEWS Temp 0  MEWS Systolic 0  MEWS Pulse 1  MEWS RR 1  MEWS LOC 0  MEWS Score 2  MEWS Score Color Yellow  Assess: if the MEWS score is Yellow or Red  Were vital signs taken at a resting state? Yes  Focused Assessment No change from prior assessment  Early Detection of Sepsis Score *See Row Information* High  MEWS guidelines implemented *See Row Information* Yes  Treat  MEWS Interventions Administered scheduled meds/treatments  Pain Scale 0-10  Pain Score 0  Take Vital Signs  Increase Vital Sign Frequency  Yellow: Q 2hr X 2 then Q 4hr X 2, if remains yellow, continue Q 4hrs  Escalate  MEWS: Escalate Yellow: discuss with charge nurse/RN and consider discussing with provider and RRT  Notify: Charge Nurse/RN  Name of Charge Nurse/RN Notified Jill, RN  Date Charge Nurse/RN Notified 02/10/21  Time Charge Nurse/RN Notified 0026  Document  Patient Outcome Stabilized after interventions  Progress note created (see row info) Yes

## 2021-02-08 NOTE — Progress Notes (Signed)
°  Echocardiogram 2D Echocardiogram has been performed.  [name] 01/24/2021, 2:47 PM

## 2021-02-08 NOTE — Progress Notes (Signed)
1604 : Pt MD text paged via secure chat pt was having difficulty breathing with low BP . Rapid response also called to assist but was in a code per conversation with RRT.  Dr. Kurtis Bushman updated and on her way. MD indicated she will ask colleague to evaluate pt while she is on her way to the room. Pt continues to deteriorate with SBP in the high 40's to 50's. Pt not responsive at this point.  Code Blue was called but was cancelled due to DNR status. Dr.Gherge responded and assisted with pt and had also updated Dr. Kurtis Bushman.  1650: Pt was declared expired with Dr. Renne Crigler, Chrissy RN , Janett Billow RN and this nurse in the room. Dr. Renne Crigler has updated Dr. Kurtis Bushman  at this point hat pt has expired. Bobn Laroche , who is apparently the only surviving son's number was called  ( tel number taken from pt's phone ) . Shared with Dr. Kurtis Bushman who in turn called him to inform him of her mother expiration. We where informed by the son that she has a pre arranged crematory service with [employer]. Marland Kitchen

## 2021-02-08 NOTE — Progress Notes (Signed)
ANTICOAGULATION CONSULT NOTE - Follow Up Consult  Pharmacy Consult for heparin Indication: DVT  Labs: Recent Labs    01/14/2021 1333 01/29/2021 1813 01/13/2021 2300 01/27/2021 2355  HGB 11.7* 11.6*  --   --   HCT 36.0 34.0*  --   --   PLT 271  --   --   --   APTT 30  --   --   --   LABPROT 17.6*  --   --   --   INR 1.5*  --   --   --   HEPARINUNFRC  --   --  0.57  --   CREATININE 1.65* 1.50*  --  1.67*  TROPONINIHS 62*  --   --   --     Assessment/Plan:  88yo female therapeutic on heparin with initial dosing for DVT. Will continue infusion at current rate of 1000 units/hr and confirm stable with am labs.   Wynona Neat, PharmD, BCPS  2021/01/18,12:44 AM

## 2021-02-08 NOTE — Progress Notes (Addendum)
ANTICOAGULATION CONSULT NOTE - Follow Up Consult  Pharmacy Consult for Heparin >> Apixaban Indication: DVT  Allergies  Allergen Reactions   Nitrofurantoin Other (See Comments)    unknown   Other Nausea And Vomiting    All seafood Other reaction(s): chest pain or shortness of breath   Tramadol Nausea And Vomiting    Other reaction(s): severe voming and constipation    Patient Measurements: Height: 5\' 3"  (160 cm) Weight: 68.2 kg (150 lb 5.7 oz) IBW/kg (Calculated) : 52.4 Heparin Dosing Weight: 66.3 kg  Vital Signs: Temp: 97.9 F (36.6 C) (01/08 0426) Temp Source: Oral (01/08 0426) BP: 110/87 (01/08 0813) Pulse Rate: 113 (01/08 0813)  Labs: Recent Labs    01/12/2021 1333 02/03/2021 1813 01/27/2021 2300 01/20/2021 2355 Feb 13, 2021 0241  HGB 11.7* 11.6*  --   --  11.1*  HCT 36.0 34.0*  --   --  34.0*  PLT 271  --   --   --  268  APTT 30  --   --   --   --   LABPROT 17.6*  --   --   --   --   INR 1.5*  --   --   --   --   HEPARINUNFRC  --   --  0.57  --  0.64  CREATININE 1.65* 1.50*  --  1.67*  --   TROPONINIHS 62*  --   --   --  42*    Estimated Creatinine Clearance: 18.3 mL/min (A) (by C-G formula based on SCr of 1.67 mg/dL (H)).   Medications:  Scheduled:   feeding supplement  237 mL Oral BID BM   furosemide  40 mg Intravenous Daily   guaiFENesin-codeine  10 mL Oral BID   ipratropium-albuterol  3 mL Nebulization Q6H   metoprolol tartrate  12.5 mg Oral BID   sodium chloride flush  3 mL Intravenous Q12H   Infusions:   sodium chloride     heparin 1,000 Units/hr (02/13/2021 0600)    Assessment: 88 yo F presenting from SNF with SOB, recent PNA, also new Afib. Doppler with DVT. Patient was not on anticoagulation PTA. Pharmacy consulted for heparin dosing.   Heparin level today is therapeutic at 0.64, on 1000 units/hr. Hgb 11.1, plt 268. No line issues or signs/symptoms of bleeding noted per RN.  Goal of Therapy:  Heparin level 0.3-0.7 units/ml Monitor platelets by  anticoagulation protocol: Yes   Plan:  Continue heparin gtt at 1000 units/hr. Daily CBC, heparin level. Monitor for signs/symptoms of bleeding. F/u long-term AC plan.    ADDENDUM: Per MD (Dr. Kurtis Bushman), patient to be transitioned from IV to oral anticoagulation. Pharmacy consulted to transition from heparin to apixaban.   Plan to stop heparin gtt tomorrow AM (1/9), then start apixaban for DVT treatment.  Starting on 1/9 - give apixaban 10 mg PO BID x 7 days, followed by 5 mg PO BID.   Monitor CBC and for signs/symptoms of bleeding.    Vance Peper, PharmD PGY1 Pharmacy Resident Phone [phone] 02-13-21 10:10 AM   Please check AMION for all [place] phone numbers After 10:00 PM, call [place] [phone]

## 2021-02-08 DEATH — deceased

## 2022-08-05 IMAGING — CT CT ANGIO CHEST
2 of 7 series · 18 of 46 positions shown · IV contrast (APPLIED)
Comparison: None.

CLINICAL DATA: Shortness of breath. Pulmonary embolism (PE)
suspected, high prob

EXAM:
CT ANGIOGRAPHY CHEST WITH CONTRAST
TECHNIQUE: Multidetector CT imaging of the chest was performed using the
standard protocol during bolus administration of intravenous
contrast. Multiplanar CT image reconstructions and MIPs were
obtained to evaluate the vascular anatomy.
CONTRAST:  60mL OMNIPAQUE IOHEXOL 350 MG/ML SOLN

[Series 8: thins · axial · 0.75mm/px · z∈[+1194,+1428]mm · 15 of 376 slices shown]
[im 21/376  lung]
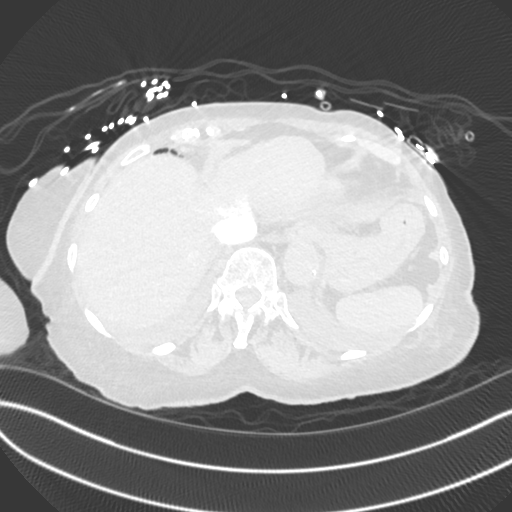
[im 42/376  soft-tissue]
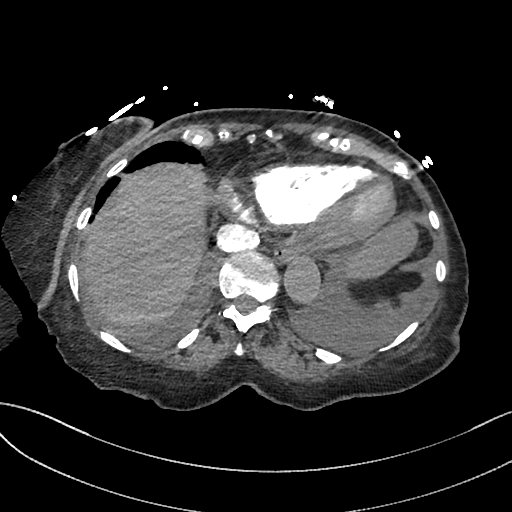
[im 63/376  lung]
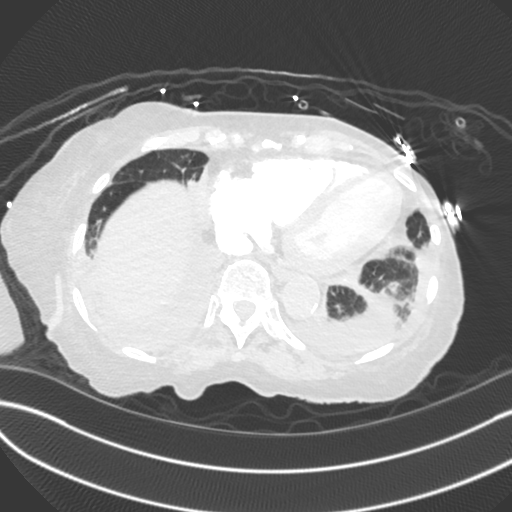
[im 84/376  soft-tissue]
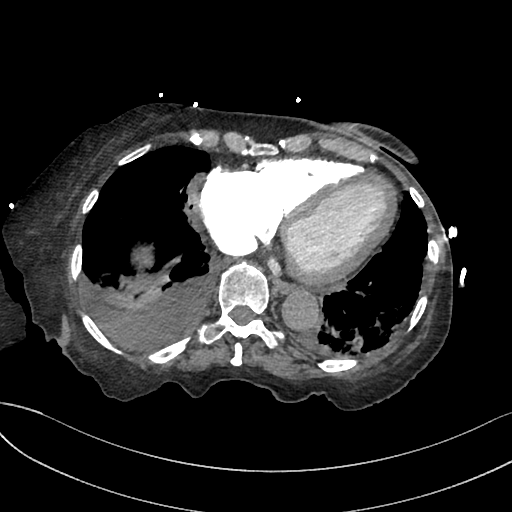
[im 126/376  lung]
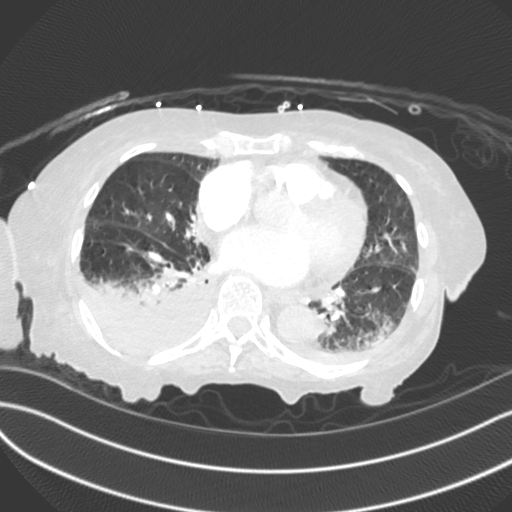
[im 146/376  soft-tissue]
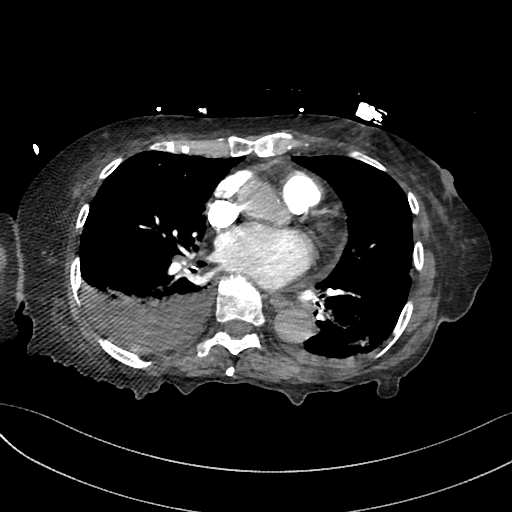
[im 167/376  lung]
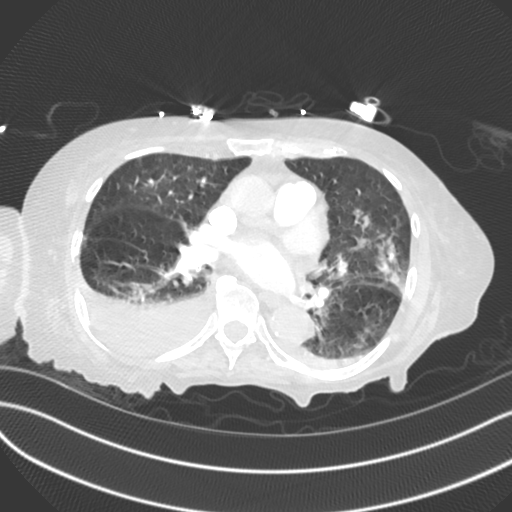
[im 188/376  soft-tissue]
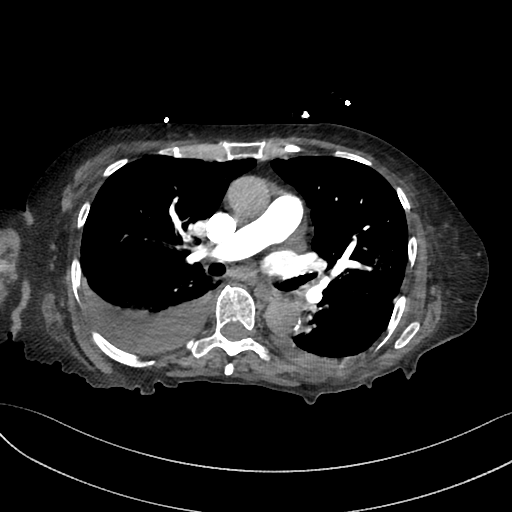
[im 209/376  lung]
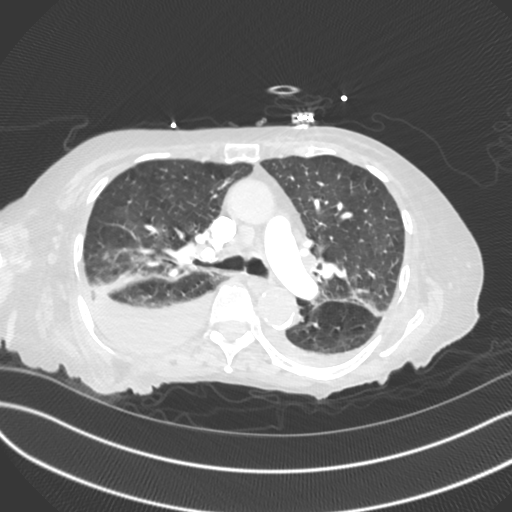
[im 230/376  soft-tissue]
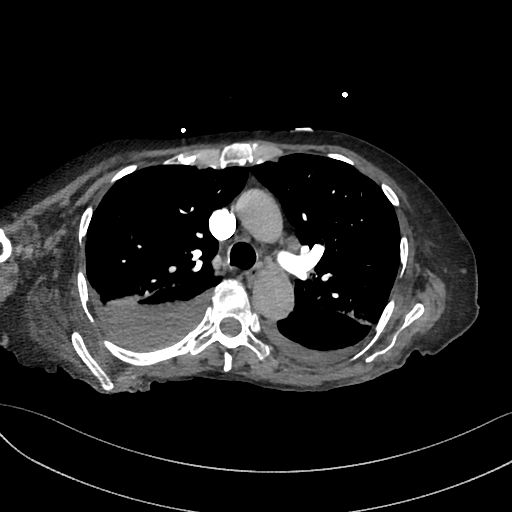
[im 251/376  lung]
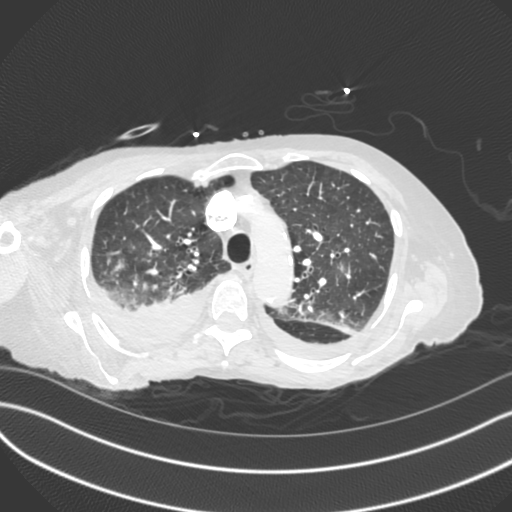
[im 292/376  soft-tissue]
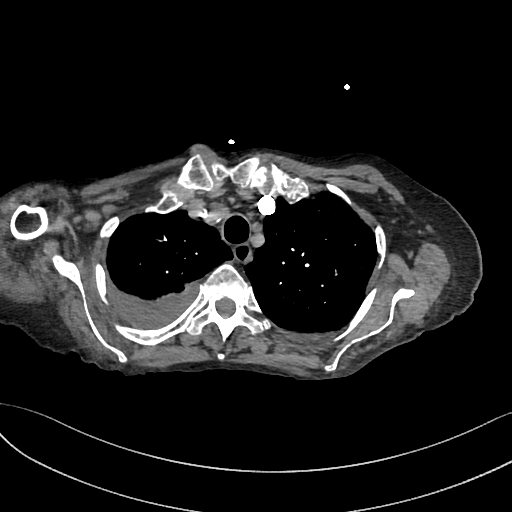
[im 313/376  lung]
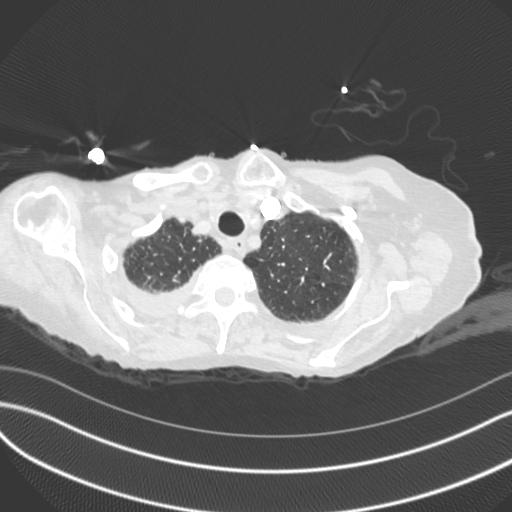
[im 334/376  soft-tissue]
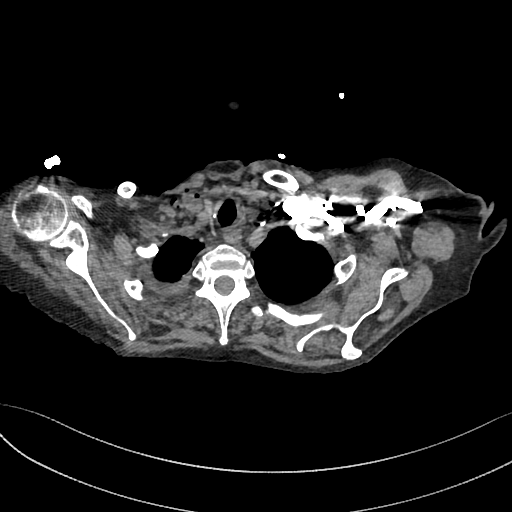
[im 355/376  lung]
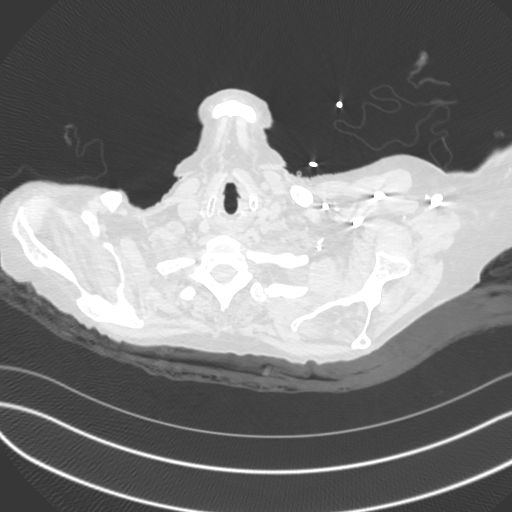

[Series 9: cor · coronal · 0.57mm/px · 3 of 126 slices shown]
[im 32/126  soft-tissue]
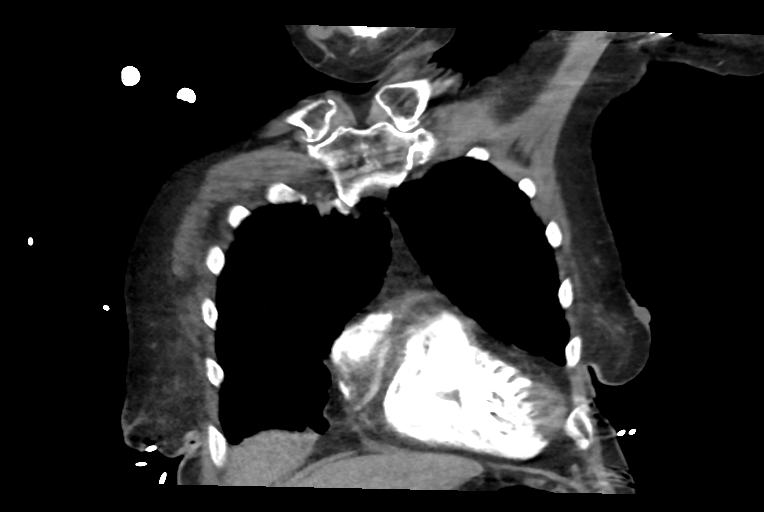
[im 63/126  soft-tissue]
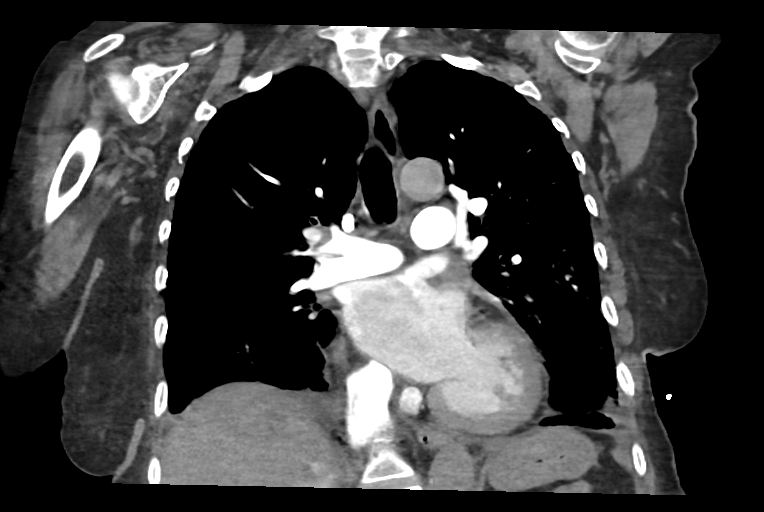
[im 94/126  soft-tissue]
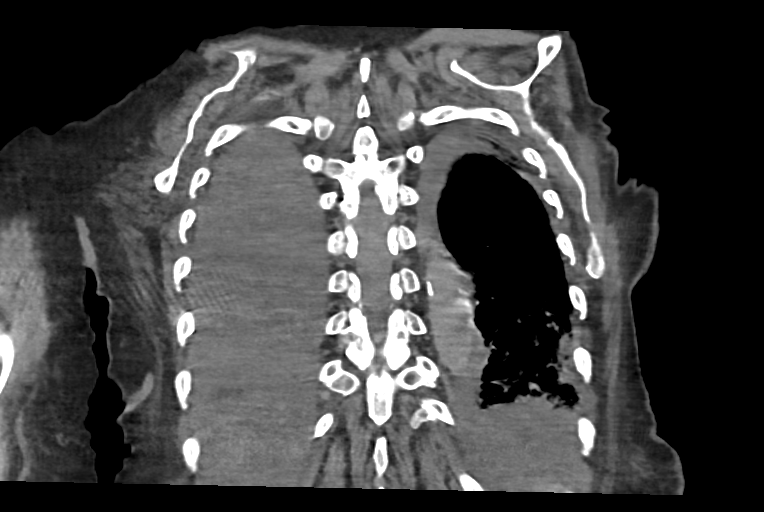

[18 of 46 positions shown; findings below may reference images not displayed]

FINDINGS: Cardiovascular: No filling defects in the pulmonary arteries to
suggest pulmonary emboli. Cardiomegaly. Scattered aortic
calcifications. No aneurysm.

Mediastinum/Nodes: No mediastinal, hilar, or axillary adenopathy.
Trachea and esophagus are unremarkable. Thyroid unremarkable.

Lungs/Pleura: Small left pleural effusion and moderate right pleural
effusion. Dependent/compressive atelectasis in the lower lobes
bilaterally. Scattered ground-glass opacities throughout both lungs
could reflect early edema.

Upper Abdomen: Imaging into the upper abdomen demonstrates no acute
findings.

Musculoskeletal: Chest wall soft tissues are unremarkable. No acute
bony abnormality.

Review of the MIP images confirms the above findings.
IMPRESSION: No evidence of pulmonary embolus.

Bilateral pleural effusions, right greater than left. Compressive
atelectasis in the lower lobes.

Cardiomegaly with vascular congestion and possible early edema.

Aortic Atherosclerosis ([id number]).

## 2022-08-05 IMAGING — DX DG CHEST 1V PORT
1 series · 1 of 1 positions shown · non-contrast
Comparison: 10/31/2018

CLINICAL DATA: Questionable sepsis, shortness of breath, pneumonia

EXAM:
PORTABLE CHEST 1 VIEW

[chest ap]
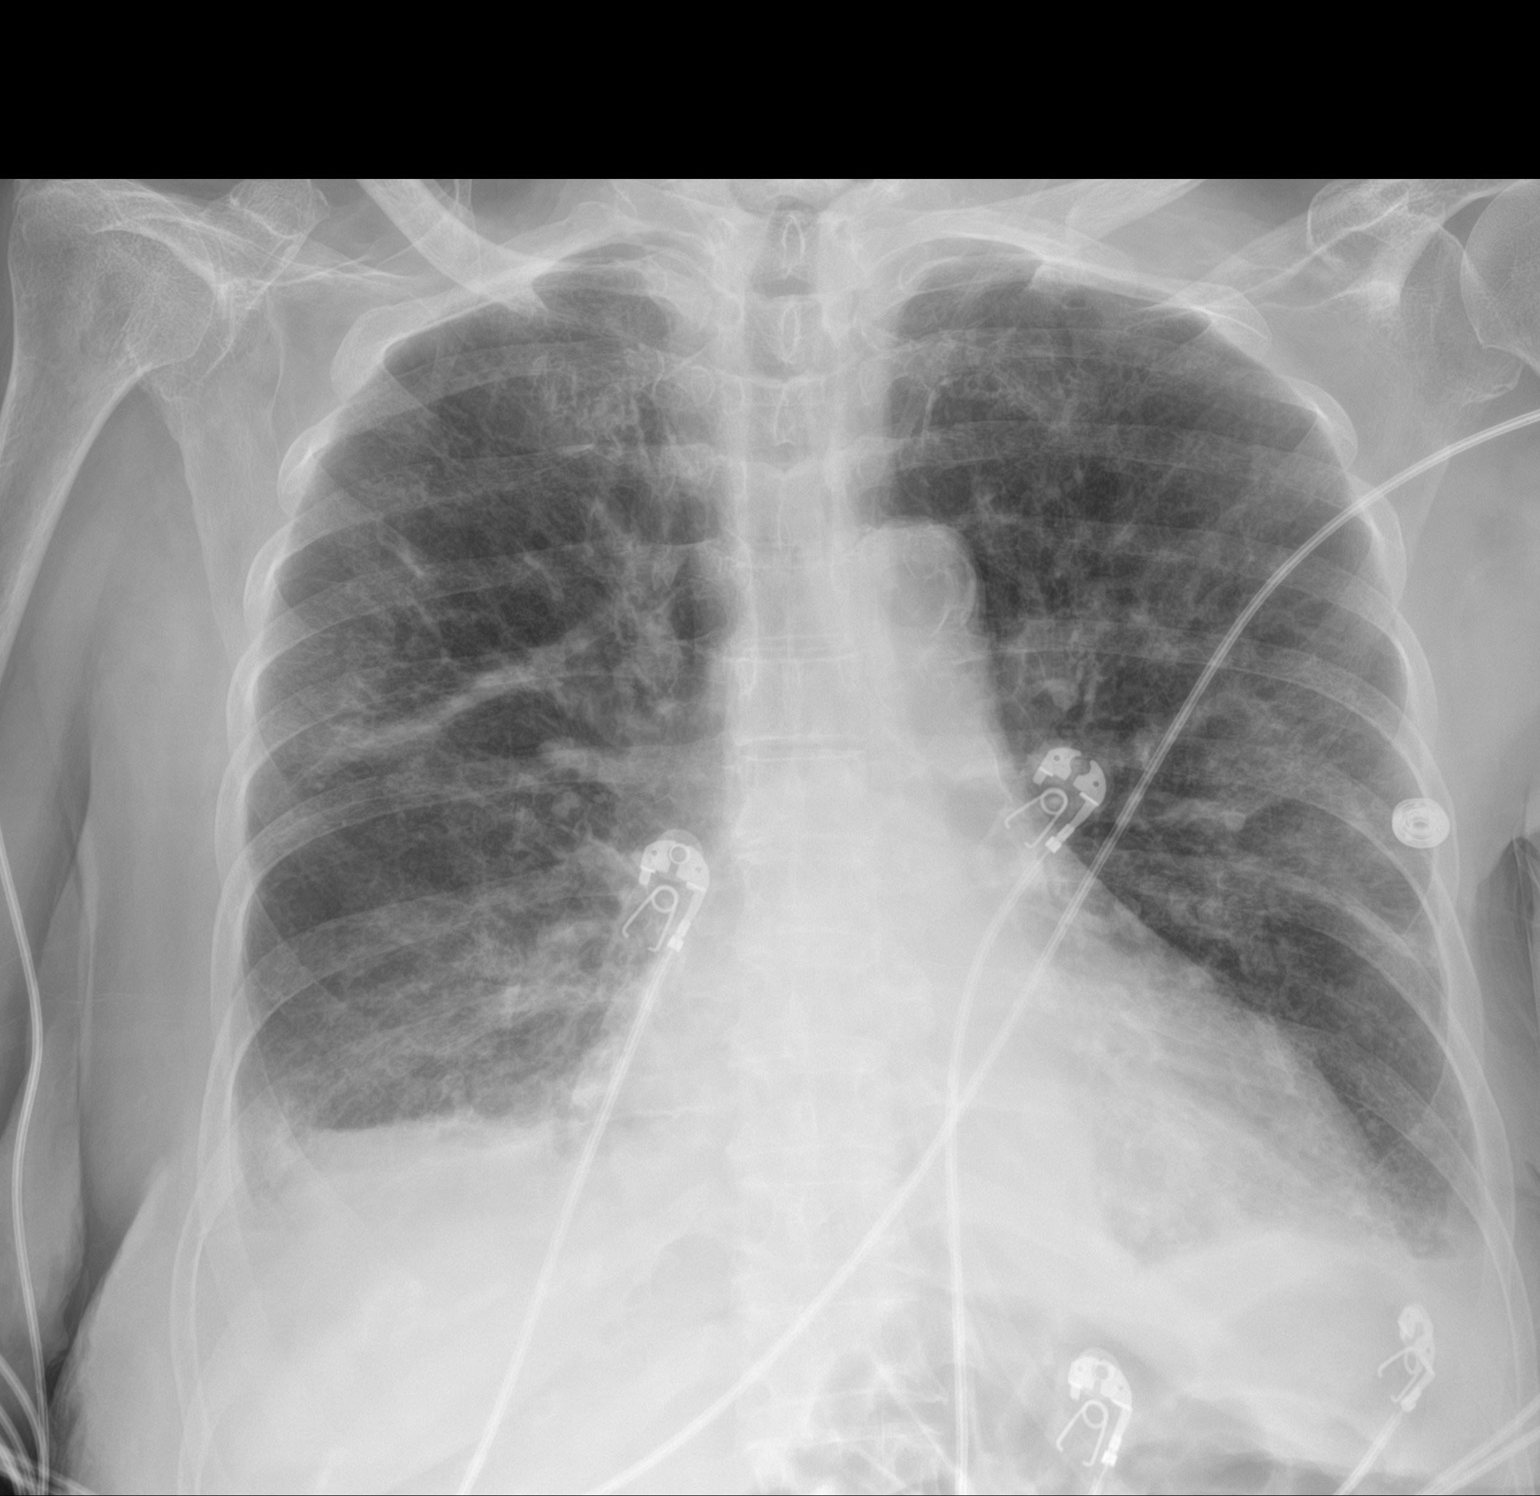

[1 of 1 positions shown; findings below may reference images not displayed]

FINDINGS: Cardiomegaly. Mild, diffuse bilateral interstitial pulmonary opacity
and small bilateral pleural effusions. The visualized skeletal
structures are unremarkable.
IMPRESSION: 1. Mild, diffuse bilateral interstitial pulmonary opacity and small
bilateral pleural effusions. No focal airspace opacity.
2. Cardiomegaly.
